# Patient Record
Sex: Female | Born: 1955 | Race: White | Hispanic: No | Marital: Married | Smoking: Never smoker
Health system: Southern US, Community
[De-identification: ages and names within clinical notes are randomized; demographics above are authoritative.]

## PROBLEM LIST (undated history)

## (undated) DIAGNOSIS — Z8719 Personal history of other diseases of the digestive system: Secondary | ICD-10-CM

## (undated) DIAGNOSIS — F419 Anxiety disorder, unspecified: Secondary | ICD-10-CM

## (undated) DIAGNOSIS — Z9889 Other specified postprocedural states: Secondary | ICD-10-CM

## (undated) DIAGNOSIS — R7303 Prediabetes: Secondary | ICD-10-CM

## (undated) DIAGNOSIS — E7800 Pure hypercholesterolemia, unspecified: Secondary | ICD-10-CM

## (undated) DIAGNOSIS — J45909 Unspecified asthma, uncomplicated: Secondary | ICD-10-CM

## (undated) DIAGNOSIS — E781 Pure hyperglyceridemia: Secondary | ICD-10-CM

## (undated) DIAGNOSIS — R112 Nausea with vomiting, unspecified: Secondary | ICD-10-CM

## (undated) DIAGNOSIS — R0789 Other chest pain: Secondary | ICD-10-CM

## (undated) DIAGNOSIS — M1612 Unilateral primary osteoarthritis, left hip: Secondary | ICD-10-CM

## (undated) HISTORY — PX: TONSILLECTOMY: SUR1361

## (undated) HISTORY — PX: WISDOM TOOTH EXTRACTION: SHX21

## (undated) HISTORY — DX: Other chest pain: R07.89

## (undated) HISTORY — DX: Pure hyperglyceridemia: E78.1

## (undated) HISTORY — PX: CARDIAC CATHETERIZATION: SHX172

## (undated) HISTORY — PX: TOOTH EXTRACTION: SUR596

## (undated) HISTORY — PX: OTHER SURGICAL HISTORY: SHX169

## (undated) HISTORY — PX: COLONOSCOPY: SHX174

## (undated) HISTORY — PX: LUMBAR LAMINECTOMY: SHX95

## (undated) HISTORY — DX: Prediabetes: R73.03

## (undated) HISTORY — DX: Pure hypercholesterolemia, unspecified: E78.00

---

## 1988-12-03 HISTORY — PX: OTHER SURGICAL HISTORY: SHX169

## 2001-06-09 ENCOUNTER — Encounter: Payer: Self-pay | Admitting: Family Medicine

## 2001-06-09 ENCOUNTER — Encounter: Admission: RE | Admit: 2001-06-09 | Discharge: 2001-06-09 | Payer: Self-pay | Admitting: Family Medicine

## 2001-06-26 ENCOUNTER — Ambulatory Visit (HOSPITAL_COMMUNITY): Admission: RE | Admit: 2001-06-26 | Discharge: 2001-06-27 | Payer: Self-pay | Admitting: Neurological Surgery

## 2001-06-26 ENCOUNTER — Encounter: Payer: Self-pay | Admitting: Neurological Surgery

## 2001-11-12 ENCOUNTER — Encounter: Payer: Self-pay | Admitting: Neurological Surgery

## 2001-11-12 ENCOUNTER — Encounter: Admission: RE | Admit: 2001-11-12 | Discharge: 2001-11-12 | Payer: Self-pay | Admitting: Neurological Surgery

## 2002-08-11 ENCOUNTER — Encounter: Admission: RE | Admit: 2002-08-11 | Discharge: 2002-08-11 | Payer: Self-pay | Admitting: Neurological Surgery

## 2002-08-11 ENCOUNTER — Encounter: Payer: Self-pay | Admitting: Neurological Surgery

## 2002-12-14 ENCOUNTER — Encounter: Admission: RE | Admit: 2002-12-14 | Discharge: 2002-12-14 | Payer: Self-pay | Admitting: Obstetrics and Gynecology

## 2002-12-14 ENCOUNTER — Encounter: Payer: Self-pay | Admitting: Obstetrics and Gynecology

## 2003-12-28 ENCOUNTER — Inpatient Hospital Stay (HOSPITAL_COMMUNITY): Admission: AD | Admit: 2003-12-28 | Discharge: 2003-12-30 | Payer: Self-pay | Admitting: Neurological Surgery

## 2005-10-22 ENCOUNTER — Other Ambulatory Visit: Admission: RE | Admit: 2005-10-22 | Discharge: 2005-10-22 | Payer: Self-pay | Admitting: Gynecology

## 2009-08-22 ENCOUNTER — Encounter: Admission: RE | Admit: 2009-08-22 | Discharge: 2009-08-22 | Payer: Self-pay | Admitting: Gynecology

## 2009-10-31 ENCOUNTER — Inpatient Hospital Stay (HOSPITAL_BASED_OUTPATIENT_CLINIC_OR_DEPARTMENT_OTHER): Admission: RE | Admit: 2009-10-31 | Discharge: 2009-10-31 | Payer: Self-pay | Admitting: Cardiovascular Disease

## 2010-09-18 ENCOUNTER — Encounter: Admission: RE | Admit: 2010-09-18 | Discharge: 2010-09-18 | Payer: Self-pay | Admitting: Gynecology

## 2010-12-23 ENCOUNTER — Encounter: Payer: Self-pay | Admitting: Obstetrics and Gynecology

## 2010-12-24 ENCOUNTER — Encounter: Payer: Self-pay | Admitting: Gynecology

## 2011-04-20 NOTE — Op Note (Signed)
Ellen Sawyer, Ellen Sawyer                         ACCOUNT NO.:  [id number]   MEDICAL RECORD NO.:  [id number]                   PATIENT TYPE:  OIB   LOCATION:  2899                                 FACILITY:  [facility]   PHYSICIAN:  Stefani Dama, M.D.               DATE OF BIRTH:  May 29, 1956   DATE OF PROCEDURE:  12/28/2003  DATE OF DISCHARGE:                                 OPERATIVE REPORT   PREOPERATIVE DIAGNOSIS:  Herniated nucleus pulposus, L5-S1, left with left  lumbar radiculopathy.   POSTOPERATIVE DIAGNOSIS:  Herniated nucleus pulposus, L5-S1, left with left  lumbar radiculopathy.   OPERATION PERFORMED:  L5-S1 microdiskectomy on the left with operating  microscope, Aesculap retractor.   SURGEON:  Stefani Dama, M.D.   ASSISTANT:  Hilda Lias, M.D.   ANESTHESIA:  General endotracheal.   INDICATIONS FOR PROCEDURE:  The patient is a 55 year old individual who has  had significant back and left lower extremity pain of recent onset.  She has  had a previous disk herniation at L5-S1 eccentric to the left side.  Had  done well for a period of three years.  The pain was identical in nature  to  which she had experienced previously. An MRI demonstrates that she had a  large recurrence of a disk fragment at L5-S1 under the S1 nerve root.  She  has been incapacitated with pain and has been requiring heavy narcotics to  maintain some level of comfort.  Patient was advised regarding surgical  decompression of this process and is now taken to the operating room.   DESCRIPTION OF PROCEDURE:  The patient was brought to the operating room  supine on the stretcher.  After smooth induction of general endotracheal  anesthesia, she was turned prone, the back was shaved, prepped with DuraPrep  and draped in sterile fashion.  Her previous incision was reopened  and the  dissection was carried down through the lumbodorsal fascia to expose the  interlaminar space at L5-S1.  This was positively  localized with a  radiograph.  Caspar lumbar microdiskectomy retractor was then placed in the  wound first in the cephalocaudal direction and then in the medial direction.  These two retractors were used to maintain opening so that the soft tissue  could be cleared over the interlaminar space of L5 out to the mesial wall of  the facet.  High speed air drill  was then used to enlarge the laminotomy  removing the inferior margin of the lamina of L5 out the mesial wall of the  facet.  The common dural tube was identified.  The dissection was carried  out laterally to expose the take off of the S1 nerve root which was noted to  be tented dorsally.  Further dissection laterally identified lateral edge of  the nerve root and then by dissecting under the nerve root inferiorly, a  disk fragment was identified.  This  was removed and immediately allowed for  some decompression on the S1 nerve root.  Further dissection revealed  several other fragments.  Once these were removed, some fibrotic attachments  of the lateral recess of the S1 nerve root could be disarticulated and this  allowed for gentle retraction of the nerve root medially.  No other  fragments of disk were encountered. There was, however, an opening into the  disk space.  This was explored further and opened further with a 15 blade.  Combination of curets and rongeurs was then used to evacuate a significant  quantity of severely degenerated disk material from within the disk space.  The surgery was done with the help of Dr. Jeral Fruit, who provided retraction of  the common dural tube and the S1 nerve root for protection while doing the  lateral portion of the diskectomy under the operating microscope.  Microdissection technique was used during this entire procedure.  In the  end, the common dural tube and the S1 nerve root was well decompressed. Both  free fragments of disk were further encountered and the area was then  checked for  hemostasis.  Microscope was then removed from the field.  The  area was inspected again.  The soft tissues were checked for hemostasis.  The lumbodorsal fascia was closed with #1 Vicryl in interrupted fashion.  2-  0 Vicryl was used in the subcutaneous tissues and 3-0 Vicryl subcuticularly.  Dermabond was placed on the skin.  The patient tolerated the procedure well  and was returned to the recovery room in stable condition.                                               Stefani Dama, M.D.    Ellen Sawyer  D:  12/28/2003  T:  12/28/2003  Job:  [id number]

## 2011-04-20 NOTE — Op Note (Signed)
[place]  Patient:    Ellen Sawyer, Ellen Sawyer                      MRN: [id number] Proc. Date: 06/26/01 Adm. Date:  [id number] Attending:  Jonne Ply                           Operative Report  PREOPERATIVE DIAGNOSIS:  L5-S1 herniated nucleus pulposus on the left, with left lumbar radiculopathy.  POSTOPERATIVE DIAGNOSIS:  L5-S1 herniated nucleus pulposus on the left, with left lumbar radiculopathy.  PROCEDURE:  Lumbar microendoscopic diskectomy with ______  SURGEON:  Stefani Dama, M.D.  ANESTHESIA:  General endotracheal.  INDICATIONS:  The patient is a 55 year old individual who has had significant back and left lower extremity pain.  She has a large herniated nucleus pulposus at L5-S1.  She has been doing poorly with conservative management and because of intractable pain surgery was advised.  DESCRIPTION OF PROCEDURE:  The patient was brought to the operating room supine on a stretcher.  After the smooth induction of general endotracheal anesthesia, she was turned prone.  The back was shaved, prepped with Duraprep and draped in a sterile fashion.  Using fluoroscopy at the left L5-S1 level was localized, and a K-wire was passed to the laminar arch of L5 on the left side.  Then, using a wanding technique, a series of dilators was placed over the K-wire so that ultimately a 6 cm x 18 mm endoscopic tube could be placed over the L5-S1 space on the left.  The tube was attached to the operating room table and framed, and then the microscope was draped and brought into the field.  The soft tissues overlying the laminar arch of L5 were cleared to the medial wall of facet.  Using an Anaspaz drill and a 2.8 mm dissecting tool, laminotomy was created removing any ______ of the lamina of the L5 out to the medial wall of the facet.  The LA ligament was thickened and redundant, and was taken up with the 2 mm and 3 mm Kerrison punch, drill tube was  exposed and then the interval between this and the S1 nerve root was identified.  There was noted to be a significant amount of scar tissue in this area, and some bleeding epidural veins.  These were cauterized, divided and then underlying this was found to be some fragments of disk.  With gentle dissection, then a large free fragment of disk material was removed from this area; this allowed for good decompression of the common dural tube and the S1 nerve root. Further exploration identified two other separate smaller fragments of disk. Once these were removed the area was explored further, and no other disk fragments could be found.  Epidural venous bleeding was controlled with bipolar cautery.  Disk space was noted to have a small rent in the posterior aspect of it, but no free disk material was found in the space.  The space was explored briefly and no loose disk material was found within the disk space itself that could be retrieved with pituitary rongeurs.  At this point, it was felt that the nerve root was decompressed.  The common dural tube was decompressed.  For that reason, no further exploration was performed.  Hemostasis was achieved in the soft tissues.  The endoscopic cannula was removed.  The microscope was removed from the field.  The soft tissues were  closed with 3-0 Vicryl in inverted interrupted fashion up to the subcuticular layer.  Dry sterile dressing was placed on the skin.  The patient tolerated the procedure well and was returned to the recovery room in stable condition. DD:  06/26/01 TD:  06/27/01 Job: [id number]

## 2011-04-20 NOTE — Discharge Summary (Signed)
Ellen Sawyer, Ellen Sawyer                         ACCOUNT NO.:  [id number]   MEDICAL RECORD NO.:  [id number]                   PATIENT TYPE:  OIB   LOCATION:  3008                                 FACILITY:  [facility]   PHYSICIAN:  Stefani Dama, M.D.               DATE OF BIRTH:  18-Mar-1956   DATE OF ADMISSION:  12/28/2003  DATE OF DISCHARGE:  12/30/2003                                 DISCHARGE SUMMARY   ADMISSION DIAGNOSES:  Herniated nucleus pulposus L5-S1 left with left lumbar  radiculopathy.   DISCHARGE/ FINAL DIAGNOSES:  Herniated nucleus pulposus L5-S1 left with left  lumbar radiculopathy.   PROCEDURE:  Lumbar microdiskectomy, L5-S1 left with operating microscope on  December 28, 2003.   CONDITION ON DISCHARGE:  Improving.   HOSPITAL COURSE:  The patient is a 55 year old individual who has had  significant back and left lower extremity pain secondary to recurrence of a  disc herniation at L5-S1 on the left side.  The patient was advised  regarding surgical intervention and was taken to the operating room where  she underwent a surgical decompression at the L5-S1 space.  She tolerated  the procedure well, however, because the patient lives independently and has  teenage kids at the home she was difficult to mobilize at first and required  some additional observation in the hospital until such time when supervision  at home would be available for her.  For that reason she stayed an extra day  in the hospital.  Her condition improved steadily, however, she did complain  of some pain in the left foot which is related to neuropathic irritability.  At this time she is discharged with a prescription for Percocet #60 without  refills, Valium 5 mg, #40 without refills.   FOLLOW UP:  The patient will be seen in the office in two weeks' time for  further follow up.                                                Stefani Dama, M.D.    Ellen Sawyer  D:  12/29/2003  T:  12/30/2003  Job:   [id number]

## 2011-08-09 ENCOUNTER — Emergency Department (HOSPITAL_COMMUNITY): Payer: Commercial Managed Care - PPO

## 2011-08-09 ENCOUNTER — Observation Stay (HOSPITAL_COMMUNITY)
Admission: EM | Admit: 2011-08-09 | Discharge: 2011-08-10 | DRG: 311 | Disposition: A | Discharge status: Home / Self Care | Payer: Commercial Managed Care - PPO | Attending: Internal Medicine | Admitting: Internal Medicine

## 2011-08-09 ENCOUNTER — Encounter: Payer: Self-pay | Admitting: Internal Medicine

## 2011-08-09 DIAGNOSIS — J45909 Unspecified asthma, uncomplicated: Secondary | ICD-10-CM | POA: Insufficient documentation

## 2011-08-09 DIAGNOSIS — E119 Type 2 diabetes mellitus without complications: Secondary | ICD-10-CM

## 2011-08-09 DIAGNOSIS — R079 Chest pain, unspecified: Principal | ICD-10-CM | POA: Insufficient documentation

## 2011-08-09 DIAGNOSIS — E785 Hyperlipidemia, unspecified: Secondary | ICD-10-CM | POA: Insufficient documentation

## 2011-08-09 LAB — BASIC METABOLIC PANEL
CO2: 27 mEq/L (ref 19–32)
Chloride: 103 mEq/L (ref 96–112)
Creatinine, Ser: 0.73 mg/dL (ref 0.50–1.10)
GFR calc Af Amer: >60 mL/min (ref >60)
Sodium: 139 mEq/L (ref 135–145)

## 2011-08-09 LAB — CBC
HCT: 39.5 % (ref 36.0–46.0)
MCHC: 33.7 g/dL (ref 30.0–36.0)
MCV: 79.8 fL (ref 78.0–100.0)
RDW: 15.5 % (ref 11.5–15.5)

## 2011-08-09 LAB — DIFFERENTIAL
Eosinophils Relative: 2 % (ref 0–5)
Lymphocytes Relative: 22 % (ref 12–46)
Lymphs Abs: 1.6 10*3/uL (ref 0.7–4.0)
Monocytes Absolute: 0.3 10*3/uL (ref 0.1–1.0)

## 2011-08-09 LAB — GLUCOSE, CAPILLARY: Glucose-Capillary: 101 mg/dL — ABNORMAL HIGH (ref 70–99)

## 2011-08-09 LAB — POCT I-STAT TROPONIN I: Troponin i, poc: 0 ng/mL (ref 0.00–0.08)

## 2011-08-09 LAB — CARDIAC PANEL(CRET KIN+CKTOT+MB+TROPI)
Relative Index: INVALID (ref 0.0–2.5)
Total CK: 88 U/L (ref 7–177)
Troponin I: <0.3 ng/mL (ref <0.30)

## 2011-08-09 LAB — LIPID PANEL
HDL: 54 mg/dL (ref >39)
LDL Cholesterol: 108 mg/dL — ABNORMAL HIGH (ref 0–99)
Triglycerides: 117 mg/dL (ref <150)
VLDL: 23 mg/dL (ref 0–40)

## 2011-08-09 LAB — CK TOTAL AND CKMB (NOT AT ARMC)
CK, MB: 2.7 ng/mL (ref 0.3–4.0)
Total CK: 71 U/L (ref 7–177)

## 2011-08-09 LAB — TROPONIN I: Troponin I: <0.3 ng/mL (ref <0.30)

## 2011-08-09 NOTE — H&P (Signed)
NISHIKA PARKHURST is an 55 y.o. female.   Chief Complaint: chest pain  HPI:   Ms. [name] is a 55 year old female with a past medical history significant for angina, diabetes mellitus, and dyslipidemia who presents to the ED with chest pain. The chest pain began this morning at 9:00 am when the patient was walking a short distance to her car. The patient drove 30 minutes to work and around 9:30 took 243 mg aspirin (3 baby aspirin). This did not resolve her chest pain despite a history of angina that is relieved by aspirin. 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 The doctor took her blood pressure at that time, and per the patient, it was around 160/130 though she doesn't remember exactly.  The doctor called EMS and transported the patient to [facility].   She describes the pain as sudden onset, sharp pain like a stabbing sensation in the right chest, between the breast and clavicle in the mid-clavicular line. This pain radiated to the back and down the right arm. The patient also complains of a second co-occuring pain that is described as a tightness around the entire chest, like a thick belt is tied around her.  At onset, the pain was a 6-7/10 and totally resolved after morphine administration in the ED. Neither pain is associated with any shortness of breath, nausea, vomiting, or palpitations. The patient did feel sweaty and warm at 9:20 before taking the aspirin but thought this was one of her typical post-menopausal hot-flashes. She reports having one similar episode of chest pain yesterday while at work. Yesterday her pain was left sided and more dull than her pain today. This pain did not radiate and was not associated with shortness of breath, nausea, vomiting, or diaphoresis.  The patient has a history of angina with no known cause. She was worked up by Dr. Charlton Haws of [facility] who performed a cardiac cath and per cardiology note in  E-chart, no abnormality was found. Dr. Eden Emms did start her on Isosorbide 40mg  daily, but the patient has not taken this regularly in about 1 year. The angina usually only comes on while she is mowing her 2 acre yard and goes away with rest and 3 baby aspirin.  She has been in her usual state of health until the last few weeks, when she started to feel like she was dragging. She says she has "lost all get up and go" and feels like she has no stamina.  She also complains of an occasional funny feeling in her chest, like her heart is fluttering or beating heavily. She often takes her pulse during these episodes and thinks she notices her pulse pause or skip a beat. She often feels short of breath for a few seconds after these events. She reports a history of bigeminy but no other arrhythmia. She reports no recent fever, change in mood, depression, or weight loss. She has been under a lot of financial stress lately and thinks this may be involved in her feeling worse.  Ms [name] has a history of GERD that is worse in the evening after spicy or fried foods and is sure the chest pain she experienced today is different. She also has a history of Asthma, her most recent exacerbation was last week after a co-worker sprayed perfume; she is also sure this episode of chest pain was not related to asthma.   Review of  Systems  Constitutional: Positive for malaise/fatigue and diaphoresis. Negative for fever, chills and weight loss.  Respiratory: Positive for wheezing. Negative for cough, hemoptysis, sputum production and shortness of breath.   Cardiovascular: Positive for chest pain, palpitations and leg swelling. Negative for orthopnea, claudication and PND.  Gastrointestinal: Positive for heartburn. Negative for nausea, vomiting, abdominal pain, diarrhea, constipation, blood in stool and melena.  Genitourinary: Positive for urgency and frequency. Negative for dysuria, hematuria and flank pain.  MSK: Positive for recent  fracture of the 5th toe on the left foot Neurological: Negative for dizziness and weakness.  Psychiatric/Behavioral: Negative for depression and substance abuse. Negative for smoking or alcohol use.    Past Medical History -Type II diabetes mellitus -dyslipidemia  -depression -asthma  -Vit D deficiency  -fractured left 5th toe -L5/S1 disk herniation  -Poor medication compliance (patient stopped taking metformin 1000 mg, isosorbide 40 mg, Crestor 10 mg, and Vit D,  one year ago when she got tired of paying for medications.)  Past Surgical History -X2 C-section -24, 26 years ago -X2 lumbar/sacral disk herniation repair X2 plantar fasciitis and bone spur removal - 10 years ago    Family History  -Father alive at 50 with extensive CAD history. First MI in early 26s and subsequent 5 vessel CABG. He has had 4-5 MIs in the past and multiple stents. He was not a smoker, did have a history of CAD, T2DM, HTN, HLD -Mother died at 25 of pancreatic cancer and cirrhosis and had a history of alcohol abuse and smoking.  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 She lives alone in [place], [place]. She has been married twice and is now divorced. She has two kids, a 65 year old son who is in the [place] and a 78 year old daughter who is in the [employer].  She has been under a lot of stress lately as she has had to foreclose on her house and is filing for bankruptcy currently. She thinks this will actually help her stay in her house. She is single now and describes her last marriage as "a nightmare" that ended 10-12 years ago. She does not smoke and does not drink any alcohol. She gets little exercise.    Allergies: Cinoxacin - caused anaphylaxis  Cipro - unsure of reaction  Medications  -60 mg cymbalta PO daily  -Aspirin 81 mg X 3 PRN chest  pain -Symbicort 2 puffs PRN asthma -Flovent 2 puffs PRN asthma   Vitals: BP 154/76, P 72, RR 23, Temp 98.3  Physical Exam General: Vital signs reviewed and noted. Well-developed, obese female, in no acute distress; alert, appropriate and cooperative throughout examination. Head: Normocephalic, atraumatic. Eyes: PERRL, EOMI, No signs of anemia or jaundince. Nose: Mucous membranes moist, not inflammed, nonerythematous. Throat: Oropharynx nonerythematous, no exudate appreciated.  Neck: No deformities, masses, or tenderness noted. Supple, No carotid Bruits, no JVD. Lungs: Normal respiratory effort. Clear to auscultation BL without crackles or wheezes. Heart: RRR. S1 and S2 normal without gallop, murmur, or rubs. Abdomen: BS normoactive. Soft, Nondistended, non-tender. No masses or organomegaly. Exam made difficult due to obesity.  Extremities: No pretibial edema. 2+ pulses radial, posterior tibialis, dorsalis pedis BL Neurologic: nonfocal   Results for orders placed during the hospital encounter of 08/09/11 (from the past 48 hour(s))  DIFFERENTIAL     Status: Normal  Collection Time   08/09/11 12:27 PM      Component Value Range Comment   Neutrophils Relative 71  43 - 77 (%)    Neutro Abs 5.3  1.7 - 7.7 (K/uL)    Lymphocytes Relative 22  12 - 46 (%)    Lymphs Abs 1.6  0.7 - 4.0 (K/uL)    Monocytes Relative 4  3 - 12 (%)    Monocytes Absolute 0.3  0.1 - 1.0 (K/uL)    Eosinophils Relative 2  0 - 5 (%)    Eosinophils Absolute 0.2  0.0 - 0.7 (K/uL)    Basophils Relative 0  0 - 1 (%)    Basophils Absolute 0.0  0.0 - 0.1 (K/uL)   CBC     Status: Normal   Collection Time   08/09/11 12:27 PM      Component Value Range Comment   WBC 7.4  4.0 - 10.5 (K/uL)    RBC 4.95  3.87 - 5.11 (MIL/uL)    Hemoglobin 13.3  12.0 - 15.0 (g/dL)    HCT 16.1  09.6 - 04.5 (%)    MCV 79.8  78.0 - 100.0 (fL)    MCH 26.9  26.0 - 34.0 (pg)    MCHC 33.7  30.0 - 36.0 (g/dL)    RDW 40.9  81.1 - 91.4 (%)     Platelets 238  150 - 400 (K/uL)   BASIC METABOLIC PANEL     Status: Abnormal   Collection Time   08/09/11 12:27 PM      Component Value Range Comment   Sodium 139  135 - 145 (mEq/L)    Potassium 4.4  3.5 - 5.1 (mEq/L)    Chloride 103  96 - 112 (mEq/L)    CO2 27  19 - 32 (mEq/L)    Glucose, Bld 114 (*) 70 - 99 (mg/dL)    BUN 17  6 - 23 (mg/dL)    Creatinine, Ser 7.82  0.50 - 1.10 (mg/dL)    Calcium 9.6  8.4 - 10.5 (mg/dL)    GFR calc non Af Amer >60  >60 (mL/min)    GFR calc Af Amer >60  >60 (mL/min)   POCT I-STAT TROPONIN I     Status: Normal   Collection Time   08/09/11 12:52 PM      Component Value Range Comment   Troponin i, poc 0.00  0.00 - 0.08 (ng/mL)    Comment 3             Dg Chest 2 View  08/09/2011  *RADIOLOGY REPORT*  Clinical Data: Chest pain, diabetes  CHEST - 2 VIEW  Comparison: Chest x-ray of 09/26/2006  Findings: The lungs are clear.  Mediastinal contours appear normal. The heart is within normal limits in size.  No bony abnormality is seen.  IMPRESSION: No active lung disease.  Original Report Authenticated By: Juline Patch, M.D.   EKG 1. 08/09/2011 10:36 am Rate -74 bpm, PR - 120 ms, QT 330 ms, QTc 355 ms Axis P 0 deg, QRS 14 deg, T 38 deg Sinus rhythm Normal -unconfirmed  2. 08/09/2011 10:59 am Rate -88 bpm, PR - 124 ms, QRSD - 78 ms, QT 328 ms, QTc 377 ms Axis P 24 deg, QRS 8 deg, T 18 deg Sinus rhythm Normal -unconfirmed   3. 08/09/2011 11:42 am Rate -68 bpm, PR - 132 ms, QRSD - 78 ms, QT 356 ms, QTc 378 ms Axis P 33 deg, QRS 3 deg, T  41 deg Sinus rhythm Normal -unconfirmed   Cardiac Cath 10/31/2009 Findings  1. Smooth and normal coronary arteries.   2. Normal left ventricular systolic function.  I suspect that her mild       anteroapical defect was due to her breast artifact.  We will       continue with medical therapy.     ROS above Physical Exam above   Assessment/Plan Ms. [name] is a 55 year old female with PMH of angina s/p normal cardiac  catheterization in 2010, T2DM, and dyslipidemia who presents with chest pain.   1. Chest pain Atypical chest pain is common among women and diabetic patients, thus the patient needs to be ruled out for MI or other ACS. The patient has a strong family history of early coronary artery disease and risk factors for CAD including diabetes and dyslipidemia, neither is being managed medically. The patient describes some intermittent palpitations which could put her at risk of forming a clot, further increasing her risk of MI .  Also on the differential diagnosis would be pulmonary embolus, though this is very unlikely given her lack of risk factors, negative history for DVT, and normal O2 sat on room air. Her recent injury to the lower extremity (broken 5th toe on left side) slightly increases her risk of DVT and PE. We will monitor her and reassess for PE with any changes.  Plan:  1. Admit to telemetry monitoring floor 2. serial cardiac enzymes, next draw at 8pm, followed by a third at 2 am.  3. Check fasting lipid panel tomorrow morning to determine CAD risk 4. Get EKG in the morning     2. Type II diabetes Mellitus  Patients random glucose is mildly elevated at 114. She has not been taking the prescribed metformin 1000 mg PO daily for approximately one year. She gets little exercise and has gained 50 lbs in the last few years. All of these are risk factors for progression of her diabetes disease.  Plan:  1. Get HbA1C level 2. Write for sliding scale insulin to be given per protocol 3. Discuss with the patient the importance of lifestyle management and medication compliance with diabetes especially in the setting of chest pain, as diabetes is a risk factor for heart disease.   3. Disposition Patient is being worked up for chest pain and ruled out for MI. Plan: If CE and EKG remain normal, patient can leave tomorrow and follow up with [facility].   Maylon Sailors 08/09/2011, 4:37 PM

## 2011-08-10 ENCOUNTER — Inpatient Hospital Stay (HOSPITAL_COMMUNITY): Payer: Commercial Managed Care - PPO

## 2011-08-10 LAB — DRUGS OF ABUSE SCREEN W/O ALC, ROUTINE URINE
Barbiturate Quant, Ur: NEGATIVE
Cocaine Metabolites: NEGATIVE
Creatinine,U: 132.5 mg/dL
Opiate Screen, Urine: POSITIVE — AB
Phencyclidine (PCP): NEGATIVE
Propoxyphene: NEGATIVE

## 2011-08-10 LAB — BASIC METABOLIC PANEL
BUN: 18 mg/dL (ref 6–23)
Calcium: 9 mg/dL (ref 8.4–10.5)
GFR calc Af Amer: >60 mL/min (ref >60)
GFR calc non Af Amer: >60 mL/min (ref >60)
Glucose, Bld: 103 mg/dL — ABNORMAL HIGH (ref 70–99)

## 2011-08-10 LAB — HEMOGLOBIN A1C: Mean Plasma Glucose: 123 mg/dL — ABNORMAL HIGH (ref <117)

## 2011-08-10 LAB — GLUCOSE, CAPILLARY
Glucose-Capillary: 105 mg/dL — ABNORMAL HIGH (ref 70–99)
Glucose-Capillary: 110 mg/dL — ABNORMAL HIGH (ref 70–99)

## 2011-08-10 LAB — CBC
HCT: 37 % (ref 36.0–46.0)
Hemoglobin: 12.1 g/dL (ref 12.0–15.0)
MCH: 26.2 pg (ref 26.0–34.0)
MCHC: 32.7 g/dL (ref 30.0–36.0)
RDW: 15.5 % (ref 11.5–15.5)

## 2011-08-15 LAB — OPIATE, QUANTITATIVE, URINE
Hydrocodone: NEGATIVE NG/ML
Morphine, Confirm: 411 NG/ML — ABNORMAL HIGH

## 2011-08-25 NOTE — Discharge Summary (Signed)
Ellen Sawyer, Ellen Sawyer               ACCOUNT NO.:  [id number]  MEDICAL RECORD NO.:  [id number]  LOCATION:  4737                         FACILITY:  [facility]  PHYSICIAN:  Doneen Poisson, MD     DATE OF BIRTH:  [birth year]  DATE OF ADMISSION:  08/09/2011 DATE OF DISCHARGE:  08/10/2011                              DISCHARGE SUMMARY   DISCHARGE DIAGNOSES: 1. Chest pain, unknown cause, likely due to cardiac vasospasm, also in the     differential esophageal spasm, biliary colic, studies pending to     further investigate these processes. 2. Asthma, no changes from the patient' baseline.  DISCHARGE MEDICATIONS: 1. Imdur 30 mg p.o. daily. 2. Aspirin 81 mg 3 pills p.o. p.r.n. chest pain. 3. Symbicort 2 puffs p.r.n. asthma.  DISPOSITION AND FOLLOWUP:  Follow up with your primary care doctor, Dr. Ludwig Clarks.  Appointment is Thursday, August 16, 2011, at 3:30 p.m. Please discuss the results of the barium swallow study to assess for esophageal spasm, discuss results of right upper quadrant ultrasound  to assess biliary colic as cause of chest pain.  PROCEDURES: 1. EKG, multiple EKGs sinus rhythm, normal EKG. 2. Barium swallow and results pending. 3. Right upper quadrant abdominal ultrasound, results pending.  CONSULTATIONS:  None.  HISTORY, PHYSICIAL AND LABORATORY DATA: [name] is a 55 year old woman with a past medical history  significant for chest pain, diabetes mellitus, and dyslipidemia who  presents to the ED with chest pain.  The chest pain began this morning  at 9 a.m. when she was walking a short distance to her car.  She drove  30 minutes to work and around 9:30 took 243 mg of aspirin (3 baby  aspirin).  This did not resolve her chest pain.  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   The doctor took her blood pressure at that time and, per [name], the  blood pressure was high at 145/100.  She does not remember the numbers    exactly.  The doctor called EMS and transported her to [facility].  She described the pain as sudden onset, sharp pain, like a stabbing sensation in the right chest, between the breast and clavicle in the midclavicular line.  The pain radiated into the back and down the right arm.  She also complained of a second co-occurring pain that is described as a tightness around the entire chest, like a thick belt that is tied around her.  At onset, her pain was a 6-7/10 and was totally resolved after morphine administration in the ED.  Neither pain is associated with any shortness of breath, nausea, vomiting, or palpitations.  She did feel sweaty and warm at 9:20 a.m. before taking the aspirin, but felt this was one of her typical postmenopausal hot flashes.  She reports one similar episode of chest pain yesterday while at work.  Yesterday, her pain was left sided and more dull than her pain upon admission.  The pain did not radiate and is not associated with any shortness of breath, nausea, vomiting, or diaphoresis.  She has a history of chest pain with  no known cause. She was worked up by Dr. Charlton Haws of [facility] who performed a cardiac catheterization and per Cardiology note in E-chart no abnormality was found.  This was in November 2010.  Dr. Eden Emms started her on isosorbide 40 mg daily, but she has not taken this regularly in approximately 1 year.  The chest pain usually only comes while she is active or stressed, for instance while mowing the 2- acre yard or experiencing stress at work.  The pain usually is resolved by 3 baby aspirins.  She has been in her usual state of health until the last few weeks, she started to feel like she was dragging.  She also complained of an occasional funny feeling in her chest, like her heart is fluttering or beating heavily.  She often takes her pulse during these episodes and thinks she notices her pulses pause or skip a beat. She  often feels short of breath for a few seconds after these fluttering events.  She reports a history of bigeminy, but no other arrhythmia. She reports no recent, fever, change in mood, depression, or weight loss.  She has been under a lot financial stress lately and thinks this may be involved her feeling worse.  [name] has a history of esophageal reflux that is worse in the evening and after eating spicy foods.  She is confident that the chest pain she has experienced today and yesterday are unrelated to a GERD process.  She also has a history of asthma, her most recent exacerbation was last week, she is also sure that this episode of chest pain is not related to asthma.  White blood cell count 7.4, hemoglobin 13.3, hematocrit 39.5, platelets  239.  Sodium 139, potassium 4.4, chloride 103, bicarb 127, BUN 17,  creatinine 0.73, glucose 114.  HOSPITAL COURSE: 1. Chest pain:  [name]'s chest pain is unlikely due to coronary     artery disease despite her family history and modest risk factors     including a history of type 2 diabetes mellitus and dyslipidemia,     her cardiac catheterization in 2010 shows no signs of coronary     artery disease and serial cardiac enzymes were negative x3.  Her      risk factors for coronary artery disease were assessed.  Her      hemoglobin A1c was 5.9, thus she no longer meets criteria for      diabetes mellitus.  Her cholesterol LDL was 108 and her HDL was      54, thus statin therapy is not indicated at this time.  The most      likely, diagnosis of the chest pain is vasospasm of the coronary      arteries and she was instructed to start Imdur 30 mg daily to help      with this.  Other possibilities include esophageal spasm, which      should be more clear after the barium swallow study results are in.       Imdur will also help if esophageal spasm is the etiology of her      chest pain.  Results from the right upper quadrant ultrasound and       barium swallow study are pending.  DISCHARGE VITAL SIGNS AND LABORATORY DATA:  Temperature 97.7, pulse 77, respiratory rate 20, blood pressure 109/74, O2 saturation 96% on room air.  Troponin I < 0.3.  CK and CK-MB normal.  Hemoglobin A1c 5.9.  Total  cholesterol 185, triglycerides 117, HDL 54, LDL 108, VLDL 23.   ______________________________ Kristie Cowman, MD   ______________________________ Doneen Poisson, MD   KS/MEDQ  D:  08/10/2011  T:  08/10/2011  Job:  [id number]  cc:   Henri Medal, MD  Electronically Signed by Kristie Cowman MD on 08/20/2011 01:07:00 PM Electronically Signed by Doneen Poisson  on 08/25/2011 12:08:46 PM

## 2012-08-19 ENCOUNTER — Encounter: Payer: Self-pay | Admitting: Cardiovascular Disease

## 2017-05-31 ENCOUNTER — Ambulatory Visit
Admission: RE | Admit: 2017-05-31 | Discharge: 2017-05-31 | Disposition: A | Discharge status: Home / Self Care | Payer: PRIVATE HEALTH INSURANCE | Source: Ambulatory Visit | Attending: Physician Assistant | Admitting: Physician Assistant

## 2017-05-31 ENCOUNTER — Other Ambulatory Visit: Payer: Self-pay | Admitting: Physician Assistant

## 2017-05-31 DIAGNOSIS — M79605 Pain in left leg: Secondary | ICD-10-CM

## 2017-05-31 DIAGNOSIS — M7989 Other specified soft tissue disorders: Secondary | ICD-10-CM

## 2017-07-26 ENCOUNTER — Other Ambulatory Visit (HOSPITAL_COMMUNITY): Payer: Self-pay | Admitting: Neurological Surgery

## 2017-07-26 ENCOUNTER — Other Ambulatory Visit: Payer: Self-pay | Admitting: Neurological Surgery

## 2017-07-26 DIAGNOSIS — M4316 Spondylolisthesis, lumbar region: Secondary | ICD-10-CM

## 2017-08-15 ENCOUNTER — Ambulatory Visit (HOSPITAL_COMMUNITY)
Admission: RE | Admit: 2017-08-15 | Discharge: 2017-08-15 | Disposition: A | Discharge status: Home / Self Care | Payer: PRIVATE HEALTH INSURANCE | Source: Ambulatory Visit | Attending: Neurological Surgery | Admitting: Neurological Surgery

## 2017-08-15 DIAGNOSIS — M4327 Fusion of spine, lumbosacral region: Secondary | ICD-10-CM | POA: Insufficient documentation

## 2017-08-15 DIAGNOSIS — M48061 Spinal stenosis, lumbar region without neurogenic claudication: Secondary | ICD-10-CM | POA: Diagnosis not present

## 2017-08-15 DIAGNOSIS — M4316 Spondylolisthesis, lumbar region: Secondary | ICD-10-CM | POA: Insufficient documentation

## 2017-08-15 DIAGNOSIS — M5136 Other intervertebral disc degeneration, lumbar region: Secondary | ICD-10-CM | POA: Diagnosis not present

## 2017-08-15 MED ORDER — DIAZEPAM 5 MG PO TABS
10.0000 mg | ORAL_TABLET | Freq: Once | ORAL | Status: AC
  Administered 2017-08-15: 10 mg via ORAL
  Filled 2017-08-15: qty 2

## 2017-08-15 MED ORDER — HYDROCODONE-ACETAMINOPHEN 5-325 MG PO TABS
ORAL_TABLET | ORAL | Status: AC
  Filled 2017-08-15: qty 1

## 2017-08-15 MED ORDER — DIAZEPAM 5 MG PO TABS
ORAL_TABLET | ORAL | Status: AC
  Administered 2017-08-15: 10 mg via ORAL
  Filled 2017-08-15: qty 2

## 2017-08-15 MED ORDER — LIDOCAINE HCL (PF) 1 % IJ SOLN
5.0000 mL | Freq: Once | INTRAMUSCULAR | Status: AC
  Administered 2017-08-15: 5 mL via INTRADERMAL

## 2017-08-15 MED ORDER — HYDROCODONE-ACETAMINOPHEN 5-325 MG PO TABS
1.0000 | ORAL_TABLET | ORAL | Status: DC | PRN
  Administered 2017-08-15: 1 via ORAL

## 2017-08-15 MED ORDER — ONDANSETRON HCL 4 MG/2ML IJ SOLN: 4.0000 mg | Freq: Four times a day (QID) | INTRAMUSCULAR | Status: DC | PRN

## 2017-08-15 MED ORDER — IOPAMIDOL (ISOVUE-M 200) INJECTION 41%
20.0000 mL | Freq: Once | INTRAMUSCULAR | Status: AC
  Administered 2017-08-15: 12 mL via INTRATHECAL

## 2017-08-15 NOTE — Procedures (Signed)
[name] is a 61 year old individual is had a previous microdiscectomy. 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  Pre op Dx: Spondylolisthesis L3-4 and L4-5 with neurogenic claudication Post op Dx: Same Procedure: Lumbar myelogram Surgeon: Janziel Hockett Puncture level: L2-3  Fluid color: Clear colorless Injection: Isovue-200 12 mL Findings: Severe stenosis worse with extension moderate stenosis L3-4 further evaluation with CT scan

## 2017-08-15 NOTE — Discharge Instructions (Signed)
Myelogram, Care After °These instructions give you information about caring for yourself after your procedure. Your doctor may also give you more specific instructions. Call your doctor if you have any problems or questions after your procedure. °Follow these instructions at home: °· Drink enough fluid to keep your pee (urine) clear or pale yellow. °· Rest as told by your doctor. °· Lie flat with your head slightly raised (elevated). °· Do not bend, lift, or do any hard activities for 24-48 hours or as told by your doctor. °· Take over-the-counter and prescription medicines only as told by your doctor. °· Take care of and remove your bandage (dressing) as told by your doctor. °· Bathe or shower as told by your doctor. °Contact a health care provider if: °· You have a fever. °· You have a headache that lasts longer than 24 hours. °· You feel sick to your stomach (nauseous). °· You throw up (vomit). °· Your neck is stiff. °· Your legs feel numb. °· You cannot pee. °· You cannot poop (have a bowel movement). °· You have a rash. °· You are itchy or sneezing. °Get help right away if: °· You have new symptoms or your symptoms get worse. °· You have a seizure. °· You have trouble breathing. °This information is not intended to replace advice given to you by your health care provider. Make sure you discuss any questions you have with your health care provider. °Document Released: 08/28/2008 Document Revised: 07/19/2016 Document Reviewed: 09/01/2015 °Elsevier Interactive Patient Education © 2018 [employer] ° °

## 2017-08-26 ENCOUNTER — Other Ambulatory Visit: Payer: Self-pay | Admitting: Neurological Surgery

## 2017-09-03 NOTE — Pre-Procedure Instructions (Signed)
Ellen Sawyer  09/03/2017      [id number] - Ellen Sawyer, [place] - [address] [name] [address] Phone: [phone] Fax: [phone]    Your procedure is scheduled on October 8  Report to Ellen [facility]yer Admitting at 0530 A.M.  Call this number if you have problems the morning of surgery:  [phone]   Remember:  Do not eat food or drink liquids after midnight.  Continue all other medications as directed by your physician except follow these medication instructions before surgery   Take these medicines the morning of surgery with A SIP OF WATER budesonide-formoterol (SYMBICORT) DULoxetine (CYMBALTA)  levothyroxine (SYNTHROID, LEVOTHROID)  7 days prior to surgery STOP taking any Aspirin (unless otherwise instructed by your surgeon), Aleve, Naproxen, Ibuprofen, Motrin, Advil, Goody's, BC's, all herbal medications, fish oil, and all vitamins    Do not wear jewelry, make-up or nail polish.  Do not wear lotions, powders, or perfumes, or deoderant.  Do not shave 48 hours prior to surgery.  Men may shave face and neck.  Do not bring valuables to the [facility].  [facility] is not responsible for any belongings or valuables.  Contacts, dentures or bridgework may not be worn into surgery.  Leave your suitcase in the car.  After surgery it may be brought to your room.  For patients admitted to the [facility], discharge time will be determined by your treatment team.  Patients discharged the day of surgery will not be allowed to drive home.     Special instructions:   [name]- Preparing For Surgery  Before surgery, you can play an important role. Because skin is not sterile, your skin needs to be as free of germs as possible. You can reduce the number of germs on your skin by washing with CHG (chlorahexidine gluconate) Soap before surgery.  CHG is an antiseptic cleaner which kills germs and bonds with the skin to continue killing  germs even after washing.  Please do not use if you have an allergy to CHG or antibacterial soaps. If your skin becomes reddened/irritated stop using the CHG.  Do not shave (including legs and underarms) for at least 48 hours prior to first CHG shower. It is OK to shave your face.  Please follow these instructions carefully.   1. Shower the NIGHT BEFORE SURGERY and the MORNING OF SURGERY with CHG.   2. If you chose to wash your hair, wash your hair first as usual with your normal shampoo.  3. After you shampoo, rinse your hair and body thoroughly to remove the shampoo.  4. Use CHG as you would any other liquid soap. You can apply CHG directly to the skin and wash gently with a scrungie or a clean washcloth.   5. Apply the CHG Soap to your body ONLY FROM THE NECK DOWN.  Do not use on open wounds or open sores. Avoid contact with your eyes, ears, mouth and genitals (private parts). Wash genitals (private parts) with your normal soap.  USE REGULAR SHAMPOO AND CONDITIONER FOR HAIR USE REGULAR SOAP FOR FACE AND PRIVATE AREA  6. Wash thoroughly, paying special attention to the area where your surgery will be performed.  7. Thoroughly rinse your body with warm water from the neck down.  8. DO NOT shower/wash with your normal soap after using and rinsing off the CHG Soap.  9. Pat yourself dry with a CLEAN TOWEL and WASH CLOTH  10. Wear CLEAN PAJAMAS to  bed the night before surgery, wear comfortable clothes the morning of surgery  11. Place CLEAN SHEETS on your bed the night of your first shower and DO NOT SLEEP WITH PETS.    Day of Surgery: Do not apply any deodorants/lotions. Please wear clean clothes to the [facility]/surgery center.      Please read over the following fact sheets that you were given.

## 2017-09-04 ENCOUNTER — Encounter (HOSPITAL_COMMUNITY): Payer: Self-pay

## 2017-09-04 ENCOUNTER — Encounter (HOSPITAL_COMMUNITY)
Admission: RE | Admit: 2017-09-04 | Discharge: 2017-09-04 | Disposition: A | Discharge status: Home / Self Care | Payer: PRIVATE HEALTH INSURANCE | Source: Ambulatory Visit | Attending: Neurological Surgery | Admitting: Neurological Surgery

## 2017-09-04 DIAGNOSIS — Z01818 Encounter for other preprocedural examination: Secondary | ICD-10-CM | POA: Insufficient documentation

## 2017-09-04 DIAGNOSIS — M48062 Spinal stenosis, lumbar region with neurogenic claudication: Secondary | ICD-10-CM | POA: Diagnosis not present

## 2017-09-04 DIAGNOSIS — I10 Essential (primary) hypertension: Secondary | ICD-10-CM | POA: Diagnosis not present

## 2017-09-04 HISTORY — DX: Other specified postprocedural states: R11.2

## 2017-09-04 HISTORY — DX: Anxiety disorder, unspecified: F41.9

## 2017-09-04 HISTORY — DX: Unspecified asthma, uncomplicated: J45.909

## 2017-09-04 HISTORY — DX: Other specified postprocedural states: Z98.890

## 2017-09-04 HISTORY — DX: Personal history of other diseases of the digestive system: Z87.19

## 2017-09-04 LAB — BASIC METABOLIC PANEL
ANION GAP: 5 (ref 5–15)
BUN: 15 mg/dL (ref 6–20)
CHLORIDE: 104 mmol/L (ref 101–111)
CO2: 29 mmol/L (ref 22–32)
Calcium: 9 mg/dL (ref 8.9–10.3)
Creatinine, Ser: 0.9 mg/dL (ref 0.44–1.00)
GFR calc Af Amer: >60 mL/min (ref >60)
GLUCOSE: 136 mg/dL — AB (ref 65–99)
POTASSIUM: 3.6 mmol/L (ref 3.5–5.1)
Sodium: 138 mmol/L (ref 135–145)

## 2017-09-04 LAB — CBC
HEMATOCRIT: 39 % (ref 36.0–46.0)
HEMOGLOBIN: 12.5 g/dL (ref 12.0–15.0)
MCH: 27.3 pg (ref 26.0–34.0)
MCHC: 32.1 g/dL (ref 30.0–36.0)
MCV: 85.2 fL (ref 78.0–100.0)
Platelets: 250 10*3/uL (ref 150–400)
RBC: 4.58 MIL/uL (ref 3.87–5.11)
RDW: 15.1 % (ref 11.5–15.5)
WBC: 7.8 10*3/uL (ref 4.0–10.5)

## 2017-09-04 LAB — SURGICAL PCR SCREEN
MRSA, PCR: NEGATIVE
STAPHYLOCOCCUS AUREUS: NEGATIVE

## 2017-09-04 LAB — TYPE AND SCREEN
ABO/RH(D): B POS
Antibody Screen: NEGATIVE

## 2017-09-04 LAB — ABO/RH: ABO/RH(D): B POS

## 2017-09-04 NOTE — Progress Notes (Signed)
PCP - Tish Men Cardiologist - denies but has seen a cardiologist in the past   Chest x-ray - not needed  EKG - 09/04/17 Stress Test - 2010 ECHO - denies Cardiac Cath - around 8 to 10 years ago     Patient denies shortness of breath, fever, cough and chest pain at PAT appointment   Patient verbalized understanding of instructions that were given to them at the PAT appointment. Patient was also instructed that they will need to review over the PAT instructions again at home before surgery.

## 2017-09-06 MED ORDER — VANCOMYCIN HCL IN DEXTROSE 1-5 GM/200ML-% IV SOLN
1000.0000 mg | INTRAVENOUS | Status: AC
  Administered 2017-09-09: 1000 mg via INTRAVENOUS
  Filled 2017-09-06: qty 200

## 2017-09-08 NOTE — Anesthesia Preprocedure Evaluation (Addendum)
Anesthesia Evaluation  Patient identified by MRN, date of birth, ID band Patient awake    Reviewed: Allergy & Precautions, NPO status , Patient's Chart, lab work & pertinent test results  History of Anesthesia Complications (+) PONV  Airway Mallampati: I  TM Distance: >3 FB     Dental  (+) Teeth Intact, Dental Advisory Given   Pulmonary asthma ,    Pulmonary exam normal breath sounds clear to auscultation       Cardiovascular Normal cardiovascular exam Rhythm:Regular Rate:Normal     Neuro/Psych negative neurological ROS     GI/Hepatic hiatal hernia, GERD  Controlled,  Endo/Other  Hypothyroidism   Renal/GU      Musculoskeletal  (+) Arthritis , Osteoarthritis,    Abdominal (+) + obese,   Peds  Hematology   Anesthesia Other Findings   Reproductive/Obstetrics                           Anesthesia Physical Anesthesia Plan  ASA: II  Anesthesia Plan: General   Post-op Pain Management:    Induction: Intravenous  PONV Risk Score and Plan: 4 or greater and Ondansetron, Dexamethasone, Midazolam, Scopolamine patch - Pre-op and Treatment may vary due to age or medical condition  Airway Management Planned: Oral ETT  Additional Equipment:   Intra-op Plan:   Post-operative Plan: Extubation in OR  Informed Consent: I have reviewed the patients History and Physical, chart, labs and discussed the procedure including the risks, benefits and alternatives for the proposed anesthesia with the patient or authorized representative who has indicated his/her understanding and acceptance.   Dental advisory given  Plan Discussed with: CRNA, Surgeon and Anesthesiologist  Anesthesia Plan Comments:       Anesthesia Quick Evaluation

## 2017-09-09 ENCOUNTER — Encounter (HOSPITAL_COMMUNITY): Payer: Self-pay | Admitting: *Deleted

## 2017-09-09 ENCOUNTER — Inpatient Hospital Stay (HOSPITAL_COMMUNITY)
Admission: RE | Admit: 2017-09-09 | Discharge: 2017-09-11 | DRG: 455 | Disposition: A | Discharge status: Home / Self Care | Payer: PRIVATE HEALTH INSURANCE | Source: Ambulatory Visit | Attending: Neurological Surgery | Admitting: Neurological Surgery

## 2017-09-09 ENCOUNTER — Inpatient Hospital Stay (HOSPITAL_COMMUNITY): Payer: PRIVATE HEALTH INSURANCE | Admitting: Anesthesiology

## 2017-09-09 ENCOUNTER — Inpatient Hospital Stay (HOSPITAL_COMMUNITY): Payer: PRIVATE HEALTH INSURANCE

## 2017-09-09 ENCOUNTER — Encounter (HOSPITAL_COMMUNITY)
Admission: RE | Disposition: A | Discharge status: Home / Self Care | Payer: Self-pay | Source: Ambulatory Visit | Attending: Neurological Surgery

## 2017-09-09 DIAGNOSIS — Z419 Encounter for procedure for purposes other than remedying health state, unspecified: Secondary | ICD-10-CM

## 2017-09-09 DIAGNOSIS — Z981 Arthrodesis status: Secondary | ICD-10-CM | POA: Diagnosis not present

## 2017-09-09 DIAGNOSIS — M5416 Radiculopathy, lumbar region: Secondary | ICD-10-CM | POA: Diagnosis present

## 2017-09-09 DIAGNOSIS — M4316 Spondylolisthesis, lumbar region: Secondary | ICD-10-CM | POA: Diagnosis present

## 2017-09-09 DIAGNOSIS — Z79899 Other long term (current) drug therapy: Secondary | ICD-10-CM | POA: Diagnosis not present

## 2017-09-09 DIAGNOSIS — M1990 Unspecified osteoarthritis, unspecified site: Secondary | ICD-10-CM | POA: Diagnosis present

## 2017-09-09 DIAGNOSIS — Z7951 Long term (current) use of inhaled steroids: Secondary | ICD-10-CM | POA: Diagnosis not present

## 2017-09-09 DIAGNOSIS — J45909 Unspecified asthma, uncomplicated: Secondary | ICD-10-CM | POA: Diagnosis present

## 2017-09-09 DIAGNOSIS — M48062 Spinal stenosis, lumbar region with neurogenic claudication: Principal | ICD-10-CM | POA: Diagnosis present

## 2017-09-09 DIAGNOSIS — E039 Hypothyroidism, unspecified: Secondary | ICD-10-CM | POA: Diagnosis present

## 2017-09-09 DIAGNOSIS — Z7982 Long term (current) use of aspirin: Secondary | ICD-10-CM | POA: Diagnosis not present

## 2017-09-09 LAB — GLUCOSE, CAPILLARY: GLUCOSE-CAPILLARY: 169 mg/dL — AB (ref 65–99)

## 2017-09-09 SURGERY — POSTERIOR LUMBAR FUSION 2 LEVEL
Anesthesia: General | Site: Spine Lumbar

## 2017-09-09 MED ORDER — METHOCARBAMOL 500 MG PO TABS
500.0000 mg | ORAL_TABLET | Freq: Four times a day (QID) | ORAL | Status: DC | PRN
  Administered 2017-09-09 – 2017-09-11 (×7): 500 mg via ORAL
  Filled 2017-09-09 (×6): qty 1

## 2017-09-09 MED ORDER — ONDANSETRON HCL 4 MG PO TABS: 4.0000 mg | ORAL_TABLET | Freq: Four times a day (QID) | ORAL | Status: DC | PRN

## 2017-09-09 MED ORDER — HYDROCODONE-ACETAMINOPHEN 5-325 MG PO TABS: 1.0000 | ORAL_TABLET | ORAL | Status: DC | PRN

## 2017-09-09 MED ORDER — SUGAMMADEX SODIUM 200 MG/2ML IV SOLN
INTRAVENOUS | Status: DC | PRN
  Administered 2017-09-09: 200 mg via INTRAVENOUS

## 2017-09-09 MED ORDER — LACTATED RINGERS IV SOLN
INTRAVENOUS | Status: DC | PRN
  Administered 2017-09-09 (×3): via INTRAVENOUS

## 2017-09-09 MED ORDER — 0.9 % SODIUM CHLORIDE (POUR BTL) OPTIME
TOPICAL | Status: DC | PRN
  Administered 2017-09-09: 1000 mL

## 2017-09-09 MED ORDER — SODIUM CHLORIDE 0.9 % IR SOLN
Status: DC | PRN
  Administered 2017-09-09: 500 mL

## 2017-09-09 MED ORDER — VANCOMYCIN HCL IN DEXTROSE 1-5 GM/200ML-% IV SOLN
1000.0000 mg | Freq: Once | INTRAVENOUS | Status: AC
  Administered 2017-09-09: 1000 mg via INTRAVENOUS
  Filled 2017-09-09: qty 200

## 2017-09-09 MED ORDER — THROMBIN 5000 UNITS EX SOLR
OROMUCOSAL | Status: DC | PRN
  Administered 2017-09-09 (×2): 5 mL via TOPICAL

## 2017-09-09 MED ORDER — DEXTROSE 5 % IV SOLN
500.0000 mg | Freq: Four times a day (QID) | INTRAVENOUS | Status: DC | PRN
  Filled 2017-09-09: qty 5

## 2017-09-09 MED ORDER — PHENYLEPHRINE HCL 10 MG/ML IJ SOLN
INTRAMUSCULAR | Status: DC | PRN
  Administered 2017-09-09: 20 ug/min via INTRAVENOUS

## 2017-09-09 MED ORDER — ONDANSETRON HCL 4 MG/2ML IJ SOLN: 4.0000 mg | Freq: Four times a day (QID) | INTRAMUSCULAR | Status: DC | PRN

## 2017-09-09 MED ORDER — ONDANSETRON HCL 4 MG/2ML IJ SOLN
INTRAMUSCULAR | Status: AC
  Filled 2017-09-09: qty 2

## 2017-09-09 MED ORDER — FENTANYL CITRATE (PF) 250 MCG/5ML IJ SOLN
INTRAMUSCULAR | Status: AC
  Filled 2017-09-09: qty 5

## 2017-09-09 MED ORDER — LIDOCAINE HCL (CARDIAC) 20 MG/ML IV SOLN
INTRAVENOUS | Status: DC | PRN
  Administered 2017-09-09: 100 mg via INTRAVENOUS

## 2017-09-09 MED ORDER — KETOROLAC TROMETHAMINE 30 MG/ML IJ SOLN
INTRAMUSCULAR | Status: AC
  Filled 2017-09-09: qty 1

## 2017-09-09 MED ORDER — BUPIVACAINE HCL (PF) 0.5 % IJ SOLN
INTRAMUSCULAR | Status: AC
  Filled 2017-09-09: qty 30

## 2017-09-09 MED ORDER — ALUM & MAG HYDROXIDE-SIMETH 200-200-20 MG/5ML PO SUSP: 30.0000 mL | Freq: Four times a day (QID) | ORAL | Status: DC | PRN

## 2017-09-09 MED ORDER — PROMETHAZINE HCL 25 MG/ML IJ SOLN: 6.2500 mg | INTRAMUSCULAR | Status: DC | PRN

## 2017-09-09 MED ORDER — THROMBIN 20000 UNITS EX SOLR
CUTANEOUS | Status: DC | PRN
  Administered 2017-09-09: 20 mL via TOPICAL

## 2017-09-09 MED ORDER — OXYCODONE-ACETAMINOPHEN 5-325 MG PO TABS
1.0000 | ORAL_TABLET | ORAL | Status: DC | PRN
  Administered 2017-09-09 – 2017-09-11 (×10): 2 via ORAL
  Filled 2017-09-09 (×10): qty 2

## 2017-09-09 MED ORDER — EPHEDRINE 5 MG/ML INJ
INTRAVENOUS | Status: AC
  Filled 2017-09-09: qty 10

## 2017-09-09 MED ORDER — MIDAZOLAM HCL 2 MG/2ML IJ SOLN
INTRAMUSCULAR | Status: AC
  Filled 2017-09-09: qty 2

## 2017-09-09 MED ORDER — THROMBIN 5000 UNITS EX SOLR
CUTANEOUS | Status: AC
  Filled 2017-09-09: qty 5000

## 2017-09-09 MED ORDER — LEVOTHYROXINE SODIUM 100 MCG PO TABS
50.0000 ug | ORAL_TABLET | Freq: Every day | ORAL | Status: DC
  Administered 2017-09-10 – 2017-09-11 (×2): 50 ug via ORAL
  Filled 2017-09-09 (×2): qty 1

## 2017-09-09 MED ORDER — POLYETHYLENE GLYCOL 3350 17 G PO PACK
17.0000 g | PACK | Freq: Every day | ORAL | Status: DC | PRN
  Administered 2017-09-10: 17 g via ORAL
  Filled 2017-09-09: qty 1

## 2017-09-09 MED ORDER — SUCCINYLCHOLINE CHLORIDE 200 MG/10ML IV SOSY
PREFILLED_SYRINGE | INTRAVENOUS | Status: AC
  Filled 2017-09-09: qty 10

## 2017-09-09 MED ORDER — CHLORHEXIDINE GLUCONATE CLOTH 2 % EX PADS: 6.0000 | MEDICATED_PAD | Freq: Once | CUTANEOUS | Status: DC

## 2017-09-09 MED ORDER — ROCURONIUM BROMIDE 100 MG/10ML IV SOLN
INTRAVENOUS | Status: DC | PRN
Start: 2017-09-09 — End: 2017-09-09
  Administered 2017-09-09: 50 mg via INTRAVENOUS
  Administered 2017-09-09 (×2): 20 mg via INTRAVENOUS
  Administered 2017-09-09: 10 mg via INTRAVENOUS

## 2017-09-09 MED ORDER — SODIUM CHLORIDE 0.9 % IV SOLN: 250.0000 mL | INTRAVENOUS | Status: DC

## 2017-09-09 MED ORDER — MENTHOL 3 MG MT LOZG: 1.0000 | LOZENGE | OROMUCOSAL | Status: DC | PRN

## 2017-09-09 MED ORDER — MOMETASONE FURO-FORMOTEROL FUM 200-5 MCG/ACT IN AERO: 2.0000 | INHALATION_SPRAY | Freq: Two times a day (BID) | RESPIRATORY_TRACT | Status: DC

## 2017-09-09 MED ORDER — LIDOCAINE-EPINEPHRINE 1 %-1:100000 IJ SOLN
INTRAMUSCULAR | Status: DC | PRN
  Administered 2017-09-09: 5 mL

## 2017-09-09 MED ORDER — ACETAMINOPHEN 650 MG RE SUPP: 650.0000 mg | RECTAL | Status: DC | PRN

## 2017-09-09 MED ORDER — SENNA 8.6 MG PO TABS
1.0000 | ORAL_TABLET | Freq: Two times a day (BID) | ORAL | Status: DC
  Administered 2017-09-09 – 2017-09-11 (×5): 8.6 mg via ORAL
  Filled 2017-09-09 (×5): qty 1

## 2017-09-09 MED ORDER — PROPOFOL 10 MG/ML IV BOLUS
INTRAVENOUS | Status: DC | PRN
  Administered 2017-09-09: 130 mg via INTRAVENOUS

## 2017-09-09 MED ORDER — KETOROLAC TROMETHAMINE 30 MG/ML IJ SOLN
30.0000 mg | Freq: Once | INTRAMUSCULAR | Status: DC | PRN
  Administered 2017-09-09: 30 mg via INTRAVENOUS

## 2017-09-09 MED ORDER — HYDROMORPHONE HCL 1 MG/ML IJ SOLN
0.2500 mg | INTRAMUSCULAR | Status: DC | PRN
  Administered 2017-09-09 (×4): 0.5 mg via INTRAVENOUS

## 2017-09-09 MED ORDER — ACETAMINOPHEN 325 MG PO TABS: 650.0000 mg | ORAL_TABLET | ORAL | Status: DC | PRN

## 2017-09-09 MED ORDER — BUPIVACAINE HCL (PF) 0.5 % IJ SOLN
INTRAMUSCULAR | Status: DC | PRN
  Administered 2017-09-09: 25 mL
  Administered 2017-09-09: 5 mL

## 2017-09-09 MED ORDER — SODIUM CHLORIDE 0.9% FLUSH
3.0000 mL | Freq: Two times a day (BID) | INTRAVENOUS | Status: DC
  Administered 2017-09-09: 3 mL via INTRAVENOUS

## 2017-09-09 MED ORDER — FENTANYL CITRATE (PF) 100 MCG/2ML IJ SOLN
INTRAMUSCULAR | Status: DC | PRN
  Administered 2017-09-09 (×3): 50 ug via INTRAVENOUS
  Administered 2017-09-09: 100 ug via INTRAVENOUS
  Administered 2017-09-09: 50 ug via INTRAVENOUS

## 2017-09-09 MED ORDER — HYDROMORPHONE HCL 1 MG/ML IJ SOLN
INTRAMUSCULAR | Status: AC
  Administered 2017-09-09: 0.5 mg via INTRAVENOUS
  Filled 2017-09-09: qty 1

## 2017-09-09 MED ORDER — DOCUSATE SODIUM 100 MG PO CAPS
100.0000 mg | ORAL_CAPSULE | Freq: Two times a day (BID) | ORAL | Status: DC
  Administered 2017-09-09 – 2017-09-11 (×4): 100 mg via ORAL
  Filled 2017-09-09 (×4): qty 1

## 2017-09-09 MED ORDER — PHENYLEPHRINE 40 MCG/ML (10ML) SYRINGE FOR IV PUSH (FOR BLOOD PRESSURE SUPPORT)
PREFILLED_SYRINGE | INTRAVENOUS | Status: DC | PRN
  Administered 2017-09-09 (×3): 80 ug via INTRAVENOUS

## 2017-09-09 MED ORDER — MIDAZOLAM HCL 5 MG/5ML IJ SOLN
INTRAMUSCULAR | Status: DC | PRN
Start: 2017-09-09 — End: 2017-09-09
  Administered 2017-09-09: 2 mg via INTRAVENOUS

## 2017-09-09 MED ORDER — THROMBIN 20000 UNITS EX SOLR
CUTANEOUS | Status: AC
  Filled 2017-09-09: qty 20000

## 2017-09-09 MED ORDER — B COMPLEX-C PO TABS
1.0000 | ORAL_TABLET | Freq: Every day | ORAL | Status: DC
  Administered 2017-09-10 – 2017-09-11 (×2): 1 via ORAL
  Filled 2017-09-09 (×2): qty 1

## 2017-09-09 MED ORDER — PHENOL 1.4 % MT LIQD: 1.0000 | OROMUCOSAL | Status: DC | PRN

## 2017-09-09 MED ORDER — LIDOCAINE-EPINEPHRINE 1 %-1:100000 IJ SOLN
INTRAMUSCULAR | Status: AC
  Filled 2017-09-09: qty 1

## 2017-09-09 MED ORDER — SODIUM CHLORIDE 0.9% FLUSH: 3.0000 mL | INTRAVENOUS | Status: DC | PRN

## 2017-09-09 MED ORDER — HYDROMORPHONE HCL 1 MG/ML IJ SOLN: 0.5000 mg | INTRAMUSCULAR | Status: DC | PRN

## 2017-09-09 MED ORDER — FLEET ENEMA 7-19 GM/118ML RE ENEM: 1.0000 | ENEMA | Freq: Once | RECTAL | Status: DC | PRN

## 2017-09-09 MED ORDER — PROPOFOL 10 MG/ML IV BOLUS
INTRAVENOUS | Status: AC
  Filled 2017-09-09: qty 20

## 2017-09-09 MED ORDER — LACTATED RINGERS IV SOLN: INTRAVENOUS | Status: DC

## 2017-09-09 MED ORDER — BISACODYL 10 MG RE SUPP: 10.0000 mg | Freq: Every day | RECTAL | Status: DC | PRN

## 2017-09-09 MED ORDER — METHOCARBAMOL 500 MG PO TABS
ORAL_TABLET | ORAL | Status: AC
  Administered 2017-09-09: 500 mg via ORAL
  Filled 2017-09-09: qty 1

## 2017-09-09 MED ORDER — SCOPOLAMINE 1 MG/3DAYS TD PT72
MEDICATED_PATCH | TRANSDERMAL | Status: DC | PRN
  Administered 2017-09-09: 1 via TRANSDERMAL

## 2017-09-09 MED ORDER — DULOXETINE HCL 30 MG PO CPEP
60.0000 mg | ORAL_CAPSULE | Freq: Every day | ORAL | Status: DC
  Administered 2017-09-10 – 2017-09-11 (×2): 60 mg via ORAL
  Filled 2017-09-09 (×2): qty 2

## 2017-09-09 MED ORDER — ONDANSETRON HCL 4 MG/2ML IJ SOLN
INTRAMUSCULAR | Status: DC | PRN
  Administered 2017-09-09: 4 mg via INTRAVENOUS

## 2017-09-09 MED ORDER — DEXAMETHASONE SODIUM PHOSPHATE 10 MG/ML IJ SOLN
INTRAMUSCULAR | Status: DC | PRN
  Administered 2017-09-09: 10 mg via INTRAVENOUS

## 2017-09-09 MED ORDER — MEPERIDINE HCL 25 MG/ML IJ SOLN: 6.2500 mg | INTRAMUSCULAR | Status: DC | PRN

## 2017-09-09 MED FILL — Heparin Sodium (Porcine) Inj 1000 Unit/ML: INTRAMUSCULAR | Qty: 30 | Status: AC

## 2017-09-09 MED FILL — Sodium Chloride IV Soln 0.9%: INTRAVENOUS | Qty: 1000 | Status: AC

## 2017-09-09 SURGICAL SUPPLY — 76 items
BAG DECANTER FOR FLEXI CONT (MISCELLANEOUS) ×3 IMPLANT
BASKET BONE COLLECTION (BASKET) ×3 IMPLANT
BLADE CLIPPER SURG (BLADE) IMPLANT
BUR MATCHSTICK NEURO 3.0 LAGG (BURR) ×3 IMPLANT
CANISTER SUCT 3000ML PPV (MISCELLANEOUS) ×3 IMPLANT
CAP LOCKING THREADED (Cap) ×18 IMPLANT
CARTRIDGE OIL MAESTRO DRILL (MISCELLANEOUS) IMPLANT
CONT SPEC 4OZ CLIKSEAL STRL BL (MISCELLANEOUS) ×3 IMPLANT
COVER BACK TABLE 60X90IN (DRAPES) ×3 IMPLANT
DECANTER SPIKE VIAL GLASS SM (MISCELLANEOUS) ×3 IMPLANT
DERMABOND ADVANCED (GAUZE/BANDAGES/DRESSINGS) ×2
DERMABOND ADVANCED .7 DNX12 (GAUZE/BANDAGES/DRESSINGS) ×1 IMPLANT
DEVICE DISSECT PLASMABLAD 3.0S (MISCELLANEOUS) ×1 IMPLANT
DIFFUSER DRILL AIR PNEUMATIC (MISCELLANEOUS) IMPLANT
DRAPE C-ARM 42X72 X-RAY (DRAPES) ×6 IMPLANT
DRAPE HALF SHEET 40X57 (DRAPES) ×3 IMPLANT
DRAPE LAPAROTOMY 100X72X124 (DRAPES) ×3 IMPLANT
DRAPE POUCH INSTRU U-SHP 10X18 (DRAPES) ×3 IMPLANT
DRSG OPSITE POSTOP 4X8 (GAUZE/BANDAGES/DRESSINGS) ×3 IMPLANT
DURAPREP 26ML APPLICATOR (WOUND CARE) ×3 IMPLANT
DURASEAL APPLICATOR TIP (TIP) IMPLANT
DURASEAL SPINE SEALANT 3ML (MISCELLANEOUS) IMPLANT
ELECT REM PT RETURN 9FT ADLT (ELECTROSURGICAL) ×3
ELECTRODE REM PT RTRN 9FT ADLT (ELECTROSURGICAL) ×1 IMPLANT
GAUZE SPONGE 4X4 12PLY STRL (GAUZE/BANDAGES/DRESSINGS) IMPLANT
GAUZE SPONGE 4X4 16PLY XRAY LF (GAUZE/BANDAGES/DRESSINGS) ×6 IMPLANT
GLOVE BIO SURGEON STRL SZ8 (GLOVE) ×3 IMPLANT
GLOVE BIO SURGEON STRL SZ8.5 (GLOVE) ×3 IMPLANT
GLOVE BIOGEL PI IND STRL 6.5 (GLOVE) ×2 IMPLANT
GLOVE BIOGEL PI IND STRL 7.0 (GLOVE) ×1 IMPLANT
GLOVE BIOGEL PI IND STRL 8 (GLOVE) ×4 IMPLANT
GLOVE BIOGEL PI IND STRL 8.5 (GLOVE) ×2 IMPLANT
GLOVE BIOGEL PI INDICATOR 6.5 (GLOVE) ×4
GLOVE BIOGEL PI INDICATOR 7.0 (GLOVE) ×2
GLOVE BIOGEL PI INDICATOR 8 (GLOVE) ×8
GLOVE BIOGEL PI INDICATOR 8.5 (GLOVE) ×4
GLOVE ECLIPSE 7.5 STRL STRAW (GLOVE) ×9 IMPLANT
GLOVE ECLIPSE 8.5 STRL (GLOVE) ×6 IMPLANT
GLOVE SURG SS PI 6.5 STRL IVOR (GLOVE) ×9 IMPLANT
GOWN STRL REUS W/ TWL LRG LVL3 (GOWN DISPOSABLE) ×2 IMPLANT
GOWN STRL REUS W/ TWL XL LVL3 (GOWN DISPOSABLE) ×1 IMPLANT
GOWN STRL REUS W/TWL 2XL LVL3 (GOWN DISPOSABLE) ×12 IMPLANT
GOWN STRL REUS W/TWL LRG LVL3 (GOWN DISPOSABLE) ×4
GOWN STRL REUS W/TWL XL LVL3 (GOWN DISPOSABLE) ×2
HEMOSTAT POWDER KIT SURGIFOAM (HEMOSTASIS) ×6 IMPLANT
KIT BASIN OR (CUSTOM PROCEDURE TRAY) ×3 IMPLANT
KIT ROOM TURNOVER OR (KITS) ×3 IMPLANT
MILL MEDIUM DISP (BLADE) ×3 IMPLANT
NEEDLE HYPO 22GX1.5 SAFETY (NEEDLE) ×3 IMPLANT
NEEDLE SPNL 18GX3.5 QUINCKE PK (NEEDLE) IMPLANT
NS IRRIG 1000ML POUR BTL (IV SOLUTION) ×3 IMPLANT
OIL CARTRIDGE MAESTRO DRILL (MISCELLANEOUS)
PACK LAMINECTOMY NEURO (CUSTOM PROCEDURE TRAY) ×3 IMPLANT
PAD ARMBOARD 7.5X6 YLW CONV (MISCELLANEOUS) ×15 IMPLANT
PATTIES SURGICAL .5 X1 (DISPOSABLE) ×6 IMPLANT
PATTIES SURGICAL 1X1 (DISPOSABLE) ×3 IMPLANT
PLASMABLADE 3.0S (MISCELLANEOUS) ×3
ROD 55MM (Rod) ×4 IMPLANT
ROD SPNL CVD 55X5.5XNS TI (Rod) ×2 IMPLANT
SCREW 6.5X45 (Screw) ×18 IMPLANT
SPACER SUSTAIN O 10X26 13MM (Spacer) ×6 IMPLANT
SPACER TPS 10X26 11MM OBLIQUE (Spacer) ×6 IMPLANT
SPONGE LAP 4X18 X RAY DECT (DISPOSABLE) ×3 IMPLANT
SPONGE SURGIFOAM ABS GEL 100 (HEMOSTASIS) ×3 IMPLANT
STRIP BIOACTIVE 20CC 25X100X8 (Miscellaneous) ×3 IMPLANT
SUT PROLENE 6 0 BV (SUTURE) ×3 IMPLANT
SUT VIC AB 1 CT1 18XBRD ANBCTR (SUTURE) ×2 IMPLANT
SUT VIC AB 1 CT1 8-18 (SUTURE) ×4
SUT VIC AB 2-0 CP2 18 (SUTURE) ×6 IMPLANT
SUT VIC AB 3-0 SH 8-18 (SUTURE) ×6 IMPLANT
SYR 3ML LL SCALE MARK (SYRINGE) ×12 IMPLANT
SYR 5ML LL (SYRINGE) IMPLANT
TOWEL GREEN STERILE (TOWEL DISPOSABLE) ×3 IMPLANT
TOWEL GREEN STERILE FF (TOWEL DISPOSABLE) ×3 IMPLANT
TRAY FOLEY W/METER SILVER 16FR (SET/KITS/TRAYS/PACK) ×3 IMPLANT
WATER STERILE IRR 1000ML POUR (IV SOLUTION) ×3 IMPLANT

## 2017-09-09 NOTE — H&P (Signed)
CHIEF COMPLAINT: Back pain with radiation into the left leg and now the right leg for about 4 month's time.  HISTORY OF PRESENT ILLNESS: Ellen Sawyer is a 61 year old right-handed person whom I have seen and treated back some 20 years ago for a ruptured disc in her lumbar spine.  She has done well, but she tells me that about 4 months ago she started developing severe pain in the back with radiation to the left lower extremity down the posterior thigh and into the leg.  This gradually seemed to get better, but then about the same time she started developing pain in the right lower extremity, particularly around the area of the hip.  She has been extremely uncomfortable.  She has had an MRI of the lumbar spine performed recently and this study demonstrates that she has auto fused L5-S1, but she has a degenerative listhesis at L4-L5 and at L3-L4 with moderately severe stenosis at each of these levels.  She also has a far lateral extraforaminal disc at L4-5 that elevates the path of the L4 nerve root outside the canal.  She notes that despite the passage of time, symptoms seem to be getting worse.  She cannot sleep at night.  It wakes her up and she finds that moving into and out of bed and turning in bed is extremely uncomfortable.  This also has given her a sensation that her right leg is going to give way unexpectedly, and she notes that sensation goes through the entirety of the leg, not just the ankle or the knee, but the leg from the region of the hip. over the past several months she has had a couple of epidural injections and physical therapy with no relief and some exacerbation at times. A myelogram has recently been performed and surgery has been discussed and she is now admitted for that procedure/    Her past medical history reveals that her general health has been good.  She tells me that she got married in the past year and overall has been doing well.  She is not taking any significant pain medications, but  she has been on thyroid and Cymbalta chronically.  PHYSICAL EXAMINATION: I note that she stands straight and erect but walks with marked antalgia involving that right lower extremity.  She appears to have global weakness in the iliopsoas to quads, tibialis anterior and gastroc all graded at 4-/5.  Deep tendon reflexes, however, are preserved in the patellae and the Achilles on both sides.  Straight leg raising, however, is markedly positive on the right side at 15 degrees and Patrick's maneuver is also positive on that right side.   To further her workup today I obtained a lateral flexion-extension of the lumbar spine in addition to an AP of the pelvis.  The pelvis film demonstrates that both her hip joints appear to be intact with the left hip showing some more arthritic changes than the right.  On the flexion-extension film she clearly has a grade 1 spondylolisthesis at L3-4 and at L4-5, which is more accentuated on the plain x-ray in flexion than it was on the MRI in her upper lumbar spine.  This likely exacerbates the degree of stenosis that she has at each of L3-4 and L4-5.  The myelogram demonstrates that she has significant spondylitic stenosis at L3-4 and L4-5, she has an auto fused L5-S1, and the biggest problem is that she has significant facet hypertrophy with severe facet arthropathy at L4-5 and L3-4 that contribute to  cause a moderate degree of central canal stenosis.  In addition, on the left side she has foraminal protrusion of the disc at the L4-5 level.  Though most of her lumbar radicular symptoms are on the left side, I believe that a simple decompression is likely to only yield further loosening of the spine and worsening spondylolisthesis.  She already has a grade 1 spondylolisthesis at L4-L5.  At this point, I have advised Ellen Sawyer that she will need surgical decompression and stabilization at L3-4 and L4-5 with pedicle screw fixation from L3 to L5 and posterior interbody arthrodesis at  the 3-4 and 4-5 spaces.  I noted that the usual hospital stay can involve a stay up to about 3 days, she will need to wear an external corset for a period of about 6 to 8 weeks after surgery, and I would advise a period of time out of work of about 3 months to allow herself to recuperate adequately.  Ellen Sawyer has had some micro surgeries in the past that she has tolerated well, but at this point I do not believe that a micro surgery is going to yield the type of relief that she needs.  She has been having difficulties with leg weakness and a sensation that she is falling, and at this point this is the surgery that is recommended.She is now admitted for surgery.

## 2017-09-09 NOTE — Progress Notes (Signed)
Vital signs stable. Patient is awakening doing well. Motor function is intact. Dressing is dry

## 2017-09-09 NOTE — Op Note (Signed)
Date of surgery: 09/09/2017 Preoperative diagnosis: Spondylolisthesis L4-L5 with lumbar radiculopathy and spondylosis listhesis and stenosis L3-L4 with neurogenic claudication and lumbar radiculopathy Postoperative diagnosis: Same Procedure: Bilateral laminectomies L3-L4 total laminectomy L4 with decompression of the L3 the L4 and the L5 nerve roots, more work than that required for simple interbody arthrodesis. Posterior lumbar interbody arthrodesis using peek spacers local autograft and allograft L3-4 and L4-5. Posterior lateral arthrodesis with local autograft and allograft L3-L5, segmental fixation L3-L5 with pedicle screws. Surgeon: Barnett Abu First assistant: Tressie Stalker M.D. Anesthesia: Gen. endotracheal Indications: Ellen Sawyer is a 61 year old individual who's had significant back and initially left leg pain now right leg pain. [name] had a progressive spondylolisthesis and has failed efforts at conservative management. Myelogram demonstrated the progression of a significant spondylolisthesis and stenosis worse with standing in extension. She is advised regarding the need for surgical decompression and stabilization.  Procedure: The patient was brought to the operating room supine on a stretcher. After the smooth induction of general endotracheal anesthesia, she was turned prone. The back was prepped with alcohol and DuraPrep and draped in a sterile fashion. A midline incision was created and carried down to the lumbar dorsal fascia. The first identifiable spinous processes were noted to be that of L3 and L4. Then a subperiosteal dissection was carried out over the facet joints at L3-4 and L4-5. The lateral gutters were decorticated isolating the transverse processes of L5 L4 and L3. These were packed away with gauze sponges. Laminectomy was then created removing the inferior marginal lamina of L3 out to and including the entirety of the L3-4 facet bilaterally. Then the entire laminar  arch of L4 was removed with the entirety of the facet at L4-L5 being exposed. The yellow ligament was taken up. The lateral recesses were then decompressed and care was taken to protect each of the L3 the L4 and L5 nerve roots as the decompression was undertaken using the #2, 3 and 4 mm Kerrison punch. Once the decompression was completed, the disc spaces were entered and a combination of curettes rongeurs and disc shavers were used to remove the entirety of the disc contents. The interbody space was completely de-needed and decorticated. The space was then sized for appropriate size spacer and is felt that a 13 x 26 x 10 mm tall sustain spacer would fill the interval into these were placed in L4 and L5. L3 and L4 similar process was carried out and here a TPS sustain spacer measuring 11 x 26 x 10 mm with 7 of lordosis was placed into the interval. A total of 6 mL of autograft was placed into the interval at L3-4 and 6 mL at L4-5. Once the posterior body procedure was completed pedicle entry sites were chosen at L3-L4 and L5. Prior to doing this the remainder of the kinex bone graft was placed into strips at the lateral gutters along with the remainder of autograft allograft combination. Pedicle entry sites were then chosen at L3-L4 and L5 and 6.5 x 45 mm screws were placed there precontoured 55 mm rods were placed between the screw heads. These were placed in a neutral construct and final radiographs identified good position of the screws with good reduction and maintenance of stable normal lordosis. Hemostasis was then checked and the soft tissues and care was taken to make sure that the L3 the L4 and L5 nerve roots remained and were well decompressed with this the lumbar dorsal fascia was closed with #1 Vicryl in interrupted fashion  2-0 Vicryl was used in subcutaneous anus tissues and 3-0 Vicryl subcuticularly. Dermabond was placed on the skin. Blood loss is estimated 300 mL. Patient tolerated procedure was  returned to recovery room in stable condition.

## 2017-09-09 NOTE — Evaluation (Signed)
Physical Therapy Evaluation Patient Details Name: Ellen Sawyer MRN: [id number] DOB: 10/30/1956 Today's Date: 09/09/2017   History of Present Illness  Pt is a 61 y/o female s/p L3-5 PLIF. PMH includes asthma and arthritis.   Clinical Impression  Pt s/p surgery above with deficits below. PTA, pt was independent with mobility, however, reports multiple falls secondary to LLE giving out on her. Upon eval, pt very unsteady without use of AD and required min A for steadying. Upon use of RW, pt able to decrease assist to min guard for safety. Reports husband will be available to assist as needed upon d/c. Pt will need DME below and would benefit from outpatient PT once cleared from precautions by MD in order to increase balance and stability with mobility. Will continue to follow acutely to maximize functional mobility independence and safety.     Follow Up Recommendations Outpatient PT;Supervision for mobility/OOB (following MD clearance )    Equipment Recommendations  Rolling walker with 5" wheels;3in1 (PT) (youth RW )    Recommendations for Other Services       Precautions / Restrictions Precautions Precautions: Fall;Back Precaution Booklet Issued: Yes (comment) Precaution Comments: Reviewed back precaution handout with pt. Pt also reporting history of falls with most recent being 3 weeks ago. Reports LLE gives out on her.  Required Braces or Orthoses: Spinal Brace Spinal Brace: Lumbar corset;Applied in sitting position Restrictions Weight Bearing Restrictions: No      Mobility  Bed Mobility Overal bed mobility: Needs Assistance Bed Mobility: Rolling;Sidelying to Sit;Sit to Sidelying Rolling: Supervision Sidelying to sit: Supervision     Sit to sidelying: Supervision General bed mobility comments: Supervision to ensure log roll technique. Increased time required secondary to pain.   Transfers Overall transfer level: Needs assistance Equipment used: None Transfers: Sit  to/from Stand Sit to Stand: Min assist         General transfer comment: Min A for steadying assist. Verbal cues to power through BLE.   Ambulation/Gait Ambulation/Gait assistance: Min assist;Min guard Ambulation Distance (Feet): 400 Feet Assistive device: Rolling walker (2 wheeled);1 person hand held assist Gait Pattern/deviations: Step-through pattern;Decreased stride length Gait velocity: Decreased Gait velocity interpretation: Below normal speed for age/gender General Gait Details: Slow, unsteady gait with HHA. Noted instability in RLE secondary to numbness and weakness. With use of RW, pt reported increased stability and decreased assist level to min guard for safety. Verbal cues for proximity to device and upright posture.   Stairs            Wheelchair Mobility    Modified Rankin (Stroke Patients Only)       Balance Overall balance assessment: Needs assistance Sitting-balance support: No upper extremity supported;Feet supported Sitting balance-Leahy Scale: Good     Standing balance support: Bilateral upper extremity supported;No upper extremity supported;During functional activity Standing balance-Leahy Scale: Fair Standing balance comment: Able to maintain static standing without UE support                              Pertinent Vitals/Pain Pain Assessment: No/denies pain Pain Score: 8  Pain Location: back  Pain Descriptors / Indicators: Aching;Operative site guarding Pain Intervention(s): Monitored during session;Limited activity within patient's tolerance;Repositioned    Home Living Family/patient expects to be discharged to:: Private residence Living Arrangements: Spouse/significant other Available Help at Discharge: Family;Available 24 hours/day Type of Home: House Home Access: Stairs to enter Entrance Stairs-Rails: None Entrance Stairs-Number of Steps: 4 Home  Layout: One level Home Equipment: None      Prior Function Level of  Independence: Independent         Comments: Reports she did not use AD, however, did report multiple falls      Hand Dominance        Extremity/Trunk Assessment   Upper Extremity Assessment Upper Extremity Assessment: Defer to OT evaluation    Lower Extremity Assessment Lower Extremity Assessment: LLE deficits/detail LLE Deficits / Details: LLE numbness reported at baseline. Noted functional weakness during ambulation as well.     Cervical / Trunk Assessment Cervical / Trunk Assessment: Other exceptions Cervical / Trunk Exceptions: s/p PLIF   Communication   Communication: No difficulties  Cognition Arousal/Alertness: Awake/alert Behavior During Therapy: WFL for tasks assessed/performed Overall Cognitive Status: Within Functional Limits for tasks assessed                                        General Comments General comments (skin integrity, edema, etc.): Pt husband present during session. Educated about generalized walking program to perform at home and use of RW to increase stability.     Exercises     Assessment/Plan    PT Assessment Patient needs continued PT services  PT Problem List Decreased strength;Decreased mobility;Decreased balance;Decreased knowledge of use of DME;Decreased knowledge of precautions;Impaired sensation;Pain       PT Treatment Interventions DME instruction;Gait training;Stair training;Functional mobility training;Balance training;Therapeutic exercise;Therapeutic activities;Neuromuscular re-education;Patient/family education    PT Goals (Current goals can be found in the Care Plan section)  Acute Rehab PT Goals Patient Stated Goal: to decrease pain  PT Goal Formulation: With patient Time For Goal Achievement: 09/16/17 Potential to Achieve Goals: Good    Frequency Min 5X/week   Barriers to discharge        Co-evaluation               AM-PAC PT "6 Clicks" Daily Activity  Outcome Measure Difficulty  turning over in bed (including adjusting bedclothes, sheets and blankets)?: None Difficulty moving from lying on back to sitting on the side of the bed? : None Difficulty sitting down on and standing up from a chair with arms (e.g., wheelchair, bedside commode, etc,.)?: Unable Help needed moving to and from a bed to chair (including a wheelchair)?: A Little Help needed walking in hospital room?: A Little Help needed climbing 3-5 steps with a railing? : A Little 6 Click Score: 18    End of Session Equipment Utilized During Treatment: Gait belt;Back brace Activity Tolerance: Patient tolerated treatment well Patient left: in bed;with call bell/phone within reach;with family/visitor present Nurse Communication: Mobility status PT Visit Diagnosis: Unsteadiness on feet (R26.81);Other symptoms and signs involving the nervous system (R29.898);History of falling (Z91.81);Muscle weakness (generalized) (M62.81);Pain Pain - part of body:  (back )    Time: 1610-9604 PT Time Calculation (min) (ACUTE ONLY): 26 min   Charges:   PT Evaluation $PT Eval Low Complexity: 1 Low PT Treatments $Gait Training: 8-22 mins   PT G Codes:        Gladys Damme, PT, DPT  Acute Rehabilitation Services  Pager: [phone]   Lehman Prom 09/09/2017, 5:35 PM

## 2017-09-09 NOTE — Transfer of Care (Signed)
Immediate Anesthesia Transfer of Care Note  Patient: Ellen Sawyer  Procedure(s) Performed: Lumbar Three-Four,Lumbar Four-Five Posterior lumbar interbody fusion (N/A Spine Lumbar)  Patient Location: PACU  Anesthesia Type:General  Level of Consciousness: awake, alert  and oriented  Airway & Oxygen Therapy: Patient Spontanous Breathing and Patient connected to nasal cannula oxygen  Post-op Assessment: Report given to RN and Post -op Vital signs reviewed and stable  Post vital signs: Reviewed and stable  Last Vitals:  Vitals:   09/09/17 0609  BP: 137/82  Pulse: 78  Resp: 19  Temp: 36.7 C  SpO2: 97%    Last Pain:  Vitals:   09/09/17 0634  TempSrc:   PainSc: 5       Patients Stated Pain Goal: 3 (09/09/17 0981)  Complications: No apparent anesthesia complications

## 2017-09-09 NOTE — Anesthesia Procedure Notes (Signed)
Procedure Name: Intubation Date/Time: 09/09/2017 7:45 AM Performed by: Carney Living Pre-anesthesia Checklist: Patient identified, Emergency Drugs available, Suction available, Patient being monitored and Timeout performed Patient Re-evaluated:Patient Re-evaluated prior to induction Oxygen Delivery Method: Circle system utilized Preoxygenation: Pre-oxygenation with 100% oxygen Induction Type: IV induction Ventilation: Mask ventilation without difficulty Laryngoscope Size: Mac and 4 Grade View: Grade I Tube type: Oral Tube size: 7.0 mm Number of attempts: 1 Airway Equipment and Method: Stylet Placement Confirmation: ETT inserted through vocal cords under direct vision,  positive ETCO2 and breath sounds checked- equal and bilateral Secured at: 20 cm Tube secured with: Tape Dental Injury: Teeth and Oropharynx as per pre-operative assessment

## 2017-09-10 LAB — BASIC METABOLIC PANEL
Anion gap: 7 (ref 5–15)
BUN: 14 mg/dL (ref 6–20)
CHLORIDE: 107 mmol/L (ref 101–111)
CO2: 28 mmol/L (ref 22–32)
Calcium: 8.5 mg/dL — ABNORMAL LOW (ref 8.9–10.3)
Creatinine, Ser: 0.93 mg/dL (ref 0.44–1.00)
GFR calc non Af Amer: >60 mL/min (ref >60)
Glucose, Bld: 109 mg/dL — ABNORMAL HIGH (ref 65–99)
POTASSIUM: 4.1 mmol/L (ref 3.5–5.1)
SODIUM: 142 mmol/L (ref 135–145)

## 2017-09-10 LAB — CBC
HCT: 29.3 % — ABNORMAL LOW (ref 36.0–46.0)
HEMOGLOBIN: 9.1 g/dL — AB (ref 12.0–15.0)
MCH: 26.8 pg (ref 26.0–34.0)
MCHC: 31.1 g/dL (ref 30.0–36.0)
MCV: 86.4 fL (ref 78.0–100.0)
Platelets: 198 10*3/uL (ref 150–400)
RBC: 3.39 MIL/uL — AB (ref 3.87–5.11)
RDW: 15 % (ref 11.5–15.5)
WBC: 12.4 10*3/uL — ABNORMAL HIGH (ref 4.0–10.5)

## 2017-09-10 NOTE — Anesthesia Postprocedure Evaluation (Signed)
Anesthesia Post Note  Patient: Ellen Sawyer  Procedure(s) Performed: Lumbar Three-Four,Lumbar Four-Five Posterior lumbar interbody fusion (N/A Spine Lumbar)     Patient location during evaluation: PACU Anesthesia Type: General Level of consciousness: awake Pain management: pain level controlled Vital Signs Assessment: post-procedure vital signs reviewed and stable Respiratory status: spontaneous breathing Cardiovascular status: stable Postop Assessment: no apparent nausea or vomiting Anesthetic complications: no    Last Vitals:  Vitals:   09/10/17 0619 09/10/17 0739  BP: 103/61 (!) 91/50  Pulse:  79  Resp:  18  Temp:  36.8 C  SpO2:  98%    Last Pain:  Vitals:   09/10/17 0739  TempSrc: Oral  PainSc:    Pain Goal: Patients Stated Pain Goal: 3 (09/10/17 0508)               Renia Mikelson JR,JOHN Susann Givens

## 2017-09-10 NOTE — Progress Notes (Signed)
Vital signs are normal Patient ambulating well Motor function good Dressing clean and dry Plan discharge for tomorrow

## 2017-09-10 NOTE — Progress Notes (Signed)
OT Note    09/10/17 1100  OT Visit Information  Last OT Received On 09/10/17  OT Time Calculation  OT Start Time (ACUTE ONLY) 0903  OT Stop Time (ACUTE ONLY) 1610  OT Time Calculation (min) 18 min  OT General Charges  $OT Visit 1 Visit  OT Evaluation  $OT Eval Low Complexity 1 Low  [facility], OT/L  [phone] 09/10/2017

## 2017-09-10 NOTE — Progress Notes (Signed)
Physical Therapy Treatment Patient Details Name: Ellen Sawyer MRN: [id number] DOB: 11/05/1956 Today's Date: 09/10/2017    History of Present Illness Pt is a 61 y/o female s/p L3-5 PLIF. PMH includes asthma and arthritis.     PT Comments    Pt progressing towards physical therapy goals. Was able to perform transfers and ambulation with gross supervision for safety and RW for BUE support. Attempted SPC however pt prefers RW. Plan to attempt stairs once more and practice bed mobility again prior to d/c tomorrow.    Follow Up Recommendations  Outpatient PT;Supervision for mobility/OOB (when appropriate per post-op protocol )     Equipment Recommendations  Rolling walker with 5" wheels;3in1 (PT) (youth RW )    Recommendations for Other Services       Precautions / Restrictions Precautions Precautions: Fall;Back Precaution Booklet Issued: Yes (comment) Precaution Comments: Reviewed back precaution handout with pt. Pt also reporting history of falls with most recent being 3 weeks ago. Reports RLE gives out on her.  Required Braces or Orthoses: Spinal Brace Spinal Brace: Lumbar corset;Applied in sitting position Restrictions Weight Bearing Restrictions: No    Mobility  Bed Mobility Overal bed mobility: Needs Assistance Bed Mobility: Rolling;Sidelying to Sit;Sit to Sidelying Rolling: Modified independent (Device/Increase time) Sidelying to sit: Supervision       General bed mobility comments: VC's for log roll technique. Pt was able to complete without assistance.   Transfers Overall transfer level: Needs assistance Equipment used: None Transfers: Sit to/from Stand Sit to Stand: Supervision         General transfer comment: VC's for hand placement on seated surface for safety. Pt initially attempting to stand before walker was in front of her, and required cues to stop and wait.   Ambulation/Gait Ambulation/Gait assistance: Supervision Ambulation Distance (Feet):  400 Feet Assistive device: Rolling walker (2 wheeled) Gait Pattern/deviations: Step-through pattern;Decreased stride length Gait velocity: Decreased Gait velocity interpretation: Below normal speed for age/gender General Gait Details: RW for stability. Attempted with SPC however pt preferred RW. Pt was able to ambulate well with no noted LOB (with BUE support)  Stairs: Pt was able to perform 4 stairs x2 with HHA for support. Practiced initially with rails and then with HHA only to simulate home environment.    Stairs            Wheelchair Mobility    Modified Rankin (Stroke Patients Only)       Balance Overall balance assessment: Needs assistance Sitting-balance support: No upper extremity supported;Feet supported Sitting balance-Leahy Scale: Good     Standing balance support: Bilateral upper extremity supported;No upper extremity supported;During functional activity Standing balance-Leahy Scale: Fair Standing balance comment: Able to maintain static standing without UE support                             Cognition Arousal/Alertness: Awake/alert Behavior During Therapy: WFL for tasks assessed/performed Overall Cognitive Status: Within Functional Limits for tasks assessed                                        Exercises      General Comments        Pertinent Vitals/Pain Pain Assessment: 0-10 Pain Score: 6  Pain Location: back  Pain Descriptors / Indicators: Aching;Operative site guarding Pain Intervention(s): Limited activity within patient's tolerance;Monitored during session;Repositioned  Home Living                      Prior Function            PT Goals (current goals can now be found in the care plan section) Acute Rehab PT Goals Patient Stated Goal: to decrease pain  PT Goal Formulation: With patient Time For Goal Achievement: 09/16/17 Potential to Achieve Goals: Good Progress towards PT goals: Progressing  toward goals    Frequency    Min 5X/week      PT Plan Current plan remains appropriate    Co-evaluation              AM-PAC PT "6 Clicks" Daily Activity  Outcome Measure  Difficulty turning over in bed (including adjusting bedclothes, sheets and blankets)?: None Difficulty moving from lying on back to sitting on the side of the bed? : None Difficulty sitting down on and standing up from a chair with arms (e.g., wheelchair, bedside commode, etc,.)?: None Help needed moving to and from a bed to chair (including a wheelchair)?: None Help needed walking in hospital room?: None Help needed climbing 3-5 steps with a railing? : None 6 Click Score: 24    End of Session Equipment Utilized During Treatment: Gait belt;Back brace Activity Tolerance: Patient tolerated treatment well Patient left: in bed;with call bell/phone within reach;with family/visitor present Nurse Communication: Mobility status PT Visit Diagnosis: Unsteadiness on feet (R26.81);Other symptoms and signs involving the nervous system (R29.898);History of falling (Z91.81);Muscle weakness (generalized) (M62.81);Pain Pain - part of body:  (back )     Time: 0752-0820 PT Time Calculation (min) (ACUTE ONLY): 28 min  Charges:  $Gait Training: 23-37 mins                    G Codes:       Conni Slipper, PT, DPT Acute Rehabilitation Services Pager: [phone]    Marylynn Pearson 09/10/2017, 8:27 AM

## 2017-09-10 NOTE — Therapy (Signed)
Occupational Therapy Evaluation and Discharge Patient Details Name: Ellen Sawyer MRN: [id number] DOB: December 26, 1955 Today's Date: 09/10/2017    History of Present Illness Pt is a 61 y/o female s/p L3-5 PLIF. PMH includes asthma and arthritis.    Clinical Impression   Pt reports being independent in ADLs, IADLs, and was working full time PTA. Currently, pt requires min assist with LB ADLs, peri care and tub shower transfer for safety. Pt requires supervision for functional mobility at RW level and with all other ADLs. Pt reports spouse is able to provide 24 hour assistance as needed upon d/c home. Pt provided all acute OT education and declines education on AE as husband will be available to assist. No further acute OT needs at this time. Pt safe to d/c home when medically allowed with supervision and assistance from husband. OT signing off.     Follow Up Recommendations  No OT follow up;Supervision - Intermittent    Equipment Recommendations  3 in 1 bedside commode    Recommendations for Other Services       Precautions / Restrictions Precautions Precautions: Fall;Back Precaution Booklet Issued: Yes (comment) Precaution Comments: Reviewed back precaution handout. Pt able to independently verbalize 3/3 back precautions.  Required Braces or Orthoses: Spinal Brace Spinal Brace: Lumbar corset;Applied in sitting position Restrictions Weight Bearing Restrictions: No      Mobility Bed Mobility Overal bed mobility: Needs Assistance          General bed mobility comments: Pt sitting in chair upon arrival. Pt able to verbalize log roll technique  Transfers Overall transfer level: Needs assistance Equipment used: Rolling walker (2 wheeled) Transfers: Sit to/from Stand Sit to Stand: Supervision         General transfer comment: Pt able to maintain precautions during sit to stand transfer.     Balance Overall balance assessment: Needs assistance Sitting-balance  support: No upper extremity supported;Feet supported Sitting balance-Leahy Scale: Good     Standing balance support: Bilateral upper extremity supported;No upper extremity supported;During functional activity Standing balance-Leahy Scale: Fair Standing balance comment: Able to maintain static standing without UE support                            ADL either performed or assessed with clinical judgement   ADL Overall ADL's : Needs assistance/impaired     Grooming: Supervision/safety;Set up;Standing;Oral care Grooming Details (indicate cue type and reason): Pt educated on two cup technqiue to maintain back precautions during oral care.  Upper Body Bathing: Supervision/ safety;Set up;Sitting   Lower Body Bathing: Minimal assistance;Sit to/from stand Lower Body Bathing Details (indicate cue type and reason): Pt currently unable to bring feet over knees. Pt declines education on AE and reports that her spouse is able to help as needed.  Upper Body Dressing : Supervision/safety;Set up;Sitting Upper Body Dressing Details (indicate cue type and reason): Pt reports spouse will be able to assist with donning back brace as needed. Pt able to verbalize wear schedule for brace.  Lower Body Dressing: Minimal assistance;Sit to/from stand Lower Body Dressing Details (indicate cue type and reason): Pt currently unable to bring feet over knees. Pt declines education on AE and reports that her spouse is able to help as needed.  Toilet Transfer: Supervision/safety;Ambulation;Comfort height toilet;RW Statistician Details (indicate cue type and reason): Pt able to complete toilet transfer with supervision for safety.  Toileting- Clothing Manipulation and Hygiene: Minimal assistance;Sit to/from stand Therapist, nutritional  Details (indicate cue type and reason): Pt educated on peri care with back precautions. Pt reports that spouse will be available to assist if needed.  Tub/ Shower  Transfer: Tub transfer;Minimal assistance;Ambulation;Rolling walker;3 in 1;Grab bars Tub/Shower Transfer Details (indicate cue type and reason): Pt demonstrated technique for getting in/out of the shower with grab bars that she has at home. Pt reports husband will be there to help her get in and out of the shower.  Functional mobility during ADLs: Supervision/safety;Rolling walker General ADL Comments: Pt educated on compensatory techniques for ADLs with back precautions.      Vision         Perception     Praxis      Pertinent Vitals/Pain Pain Assessment: No/denies pain Pain Score: 4  Pain Location: back  Pain Descriptors / Indicators: Aching;Operative site guarding Pain Intervention(s): Monitored during session;Premedicated before session     Hand Dominance     Extremity/Trunk Assessment Upper Extremity Assessment Upper Extremity Assessment: Overall WFL for tasks assessed   Lower Extremity Assessment Lower Extremity Assessment: Defer to PT evaluation   Cervical / Trunk Assessment Cervical / Trunk Assessment: Other exceptions Cervical / Trunk Exceptions: s/p PLIF    Communication Communication Communication: No difficulties   Cognition Arousal/Alertness: Awake/alert Behavior During Therapy: WFL for tasks assessed/performed Overall Cognitive Status: Within Functional Limits for tasks assessed                                     General Comments  Pt reports that her primary concerns for d/c home have been addressed and that she has no questions at this time.     Exercises     Shoulder Instructions      Home Living Family/patient expects to be discharged to:: Private residence Living Arrangements: Spouse/significant other Available Help at Discharge: Family;Available 24 hours/day Type of Home: House Home Access: Stairs to enter [employer] of Steps: 4 Entrance Stairs-Rails: None Home Layout: One level     Bathroom Shower/Tub:  IT trainer: Standard Bathroom Accessibility: Yes How Accessible: Accessible via walker Home Equipment: None          Prior Functioning/Environment Level of Independence: Independent        Comments: Pt works full time at a doctor's office. Was active at home (cutting grass, cooking, cleaning) and was driving PTA. Pt reports no use of AD PTA but reports multiple falls. Encouraged pt to use RW for safety with functional mobility.         OT Problem List:  Decreased knowledge of precautions; Pain      OT Treatment/Interventions:      OT Goals(Current goals can be found in the care plan section) Acute Rehab OT Goals Patient Stated Goal: To continue to make progress  OT Goal Formulation: With patient  OT Frequency:     Barriers to D/C:            Co-evaluation              AM-PAC PT "6 Clicks" Daily Activity     Outcome Measure Help from another person eating meals?: None Help from another person taking care of personal grooming?: None Help from another person toileting, which includes using toliet, bedpan, or urinal?: A Little Help from another person bathing (including washing, rinsing, drying)?: A Little Help from another person to put on and taking off regular upper body  clothing?: None Help from another person to put on and taking off regular lower body clothing?: A Little 6 Click Score: 21   End of Session Equipment Utilized During Treatment: Gait belt;Rolling walker;Back brace Nurse Communication: Mobility status  Activity Tolerance: Patient tolerated treatment well Patient left: in chair;with call bell/phone within reach  OT Visit Diagnosis: Unsteadiness on feet (R26.81);Pain Pain - part of body:  (Back)                Time: 4098-1191 OT Time Calculation (min): 18 min Charges:    G-Codes:     Cammy Copa, OTS [phone]   Cammy Copa 09/10/2017, 9:44 AM

## 2017-09-11 MED ORDER — OXYCODONE-ACETAMINOPHEN 5-325 MG PO TABS: 1.0000 | ORAL_TABLET | ORAL | 0 refills | Status: DC | PRN

## 2017-09-11 MED ORDER — METHOCARBAMOL 500 MG PO TABS: 500.0000 mg | ORAL_TABLET | Freq: Four times a day (QID) | ORAL | 3 refills | Status: DC | PRN

## 2017-09-11 NOTE — Progress Notes (Signed)
Physical Therapy Treatment Patient Details Name: Ellen Sawyer MRN: [id number] DOB: [birth year] Today's Date: 09/11/2017    History of Present Illness Pt is a 61 y/o female s/p L3-5 PLIF. PMH includes asthma and arthritis.     PT Comments    Pt progressing towards physical therapy goals. Was able to perform transfers and ambulation with gross supervision for safety to modified independence with RW for support. Pt was educated on precautions, walking program, brace application/wearing schedule, car transfer, and general safety with activity progression. Will continue to follow and progress as able per POC.    Follow Up Recommendations  Outpatient PT;Supervision for mobility/OOB (when appropriate per post-op protocol )     Equipment Recommendations  Rolling walker with 5" wheels;3in1 (PT) (youth RW )    Recommendations for Other Services       Precautions / Restrictions Precautions Precautions: Fall;Back Precaution Booklet Issued: Yes (comment) Precaution Comments: Pt was cued for precautions during functional mobility. Pt was able to recall 3/3 precautions without assist.  Required Braces or Orthoses: Spinal Brace Spinal Brace: Lumbar corset;Applied in sitting position Restrictions Weight Bearing Restrictions: No    Mobility  Bed Mobility Overal bed mobility: Modified Independent Bed Mobility: Rolling;Sidelying to Sit;Sit to Sidelying Rolling: Modified independent (Device/Increase time) Sidelying to sit: Modified independent (Device/Increase time)     Sit to sidelying: Modified independent (Device/Increase time) General bed mobility comments: HOB flat and rails lowered to simulate home environment.   Transfers Overall transfer level: Modified independent Equipment used: Rolling walker (2 wheeled) Transfers: Sit to/from Stand           General transfer comment: VC's for hand placement on seated surface for safety.   Ambulation/Gait Ambulation/Gait  assistance: Supervision Ambulation Distance (Feet): 400 Feet Assistive device: Rolling walker (2 wheeled) Gait Pattern/deviations: Step-through pattern;Decreased stride length Gait velocity: Decreased Gait velocity interpretation: Below normal speed for age/gender General Gait Details: RW for added stability. Pt was able to ambulate well without assistance.    Stairs Stairs: Yes   Stair Management: One rail Right;Step to pattern;Forwards Number of Stairs: 4 General stair comments: HHA provided on L as well. Pt was able to demonstrate proper sequencing and general safety.   Wheelchair Mobility    Modified Rankin (Stroke Patients Only)       Balance Overall balance assessment: Needs assistance Sitting-balance support: No upper extremity supported;Feet supported Sitting balance-Leahy Scale: Good     Standing balance support: Bilateral upper extremity supported;No upper extremity supported;During functional activity Standing balance-Leahy Scale: Fair Standing balance comment: Able to maintain static standing without UE support                             Cognition Arousal/Alertness: Awake/alert Behavior During Therapy: WFL for tasks assessed/performed Overall Cognitive Status: Within Functional Limits for tasks assessed                                        Exercises      General Comments        Pertinent Vitals/Pain Pain Assessment: Faces Faces Pain Scale: Hurts a little bit Pain Location: back  Pain Descriptors / Indicators: Aching;Operative site guarding Pain Intervention(s): Limited activity within patient's tolerance;Monitored during session;Repositioned    Home Living  Prior Function            PT Goals (current goals can now be found in the care plan section) Acute Rehab PT Goals Patient Stated Goal: To continue to make progress  PT Goal Formulation: With patient Time For Goal Achievement:  09/16/17 Potential to Achieve Goals: Good Progress towards PT goals: Progressing toward goals    Frequency    Min 5X/week      PT Plan Current plan remains appropriate    Co-evaluation              AM-PAC PT "6 Clicks" Daily Activity  Outcome Measure  Difficulty turning over in bed (including adjusting bedclothes, sheets and blankets)?: None Difficulty moving from lying on back to sitting on the side of the bed? : None Difficulty sitting down on and standing up from a chair with arms (e.g., wheelchair, bedside commode, etc,.)?: None Help needed moving to and from a bed to chair (including a wheelchair)?: None Help needed walking in hospital room?: None Help needed climbing 3-5 steps with a railing? : None 6 Click Score: 24    End of Session Equipment Utilized During Treatment: Gait belt;Back brace Activity Tolerance: Patient tolerated treatment well Patient left: in bed;with call bell/phone within reach;with family/visitor present Nurse Communication: Mobility status PT Visit Diagnosis: Unsteadiness on feet (R26.81);Other symptoms and signs involving the nervous system (R29.898);History of falling (Z91.81);Muscle weakness (generalized) (M62.81);Pain Pain - part of body:  (back )     Time: 5366-4403 PT Time Calculation (min) (ACUTE ONLY): 17 min  Charges:  $Gait Training: 8-22 mins                    G Codes:       Conni Slipper, PT, DPT Acute Rehabilitation Services Pager: [phone]    Marylynn Pearson 09/11/2017, 9:13 AM

## 2017-09-11 NOTE — Progress Notes (Signed)
Patient alert and oriented, mae's well, voiding adequate amount of urine, swallowing without difficulty, no c/o pain at time of discharge. Patient discharged home with family. Script and discharged instructions given to patient. Patient and family stated understanding of instructions given. Patient has an appointment with Dr. Elsner  

## 2017-09-11 NOTE — Discharge Summary (Signed)
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 History of discectomy and fusion L5-S1. Discharge and final diagnosis: Spondylolisthesis and stenosis L3-4, L4-5 with lumbar radiculopathy. History of discectomy fusion L5-S1. Condition on discharge: Improved Hospital course: Patient was admitted to undergo surgical decompression arthrodesis at 35 and L4-5 she tolerated this well. Incision is clean and dry. She is discharged home. Medications on discharge: Percocet 5/325 #60 no refills Methocarbamol 500 mg #4o  3 refills

## 2018-01-14 ENCOUNTER — Ambulatory Visit (INDEPENDENT_AMBULATORY_CARE_PROVIDER_SITE_OTHER): Payer: PRIVATE HEALTH INSURANCE | Admitting: Orthopaedic Surgery

## 2018-01-14 ENCOUNTER — Encounter (INDEPENDENT_AMBULATORY_CARE_PROVIDER_SITE_OTHER): Payer: Self-pay | Admitting: Orthopaedic Surgery

## 2018-01-14 ENCOUNTER — Ambulatory Visit (INDEPENDENT_AMBULATORY_CARE_PROVIDER_SITE_OTHER): Payer: PRIVATE HEALTH INSURANCE

## 2018-01-14 DIAGNOSIS — M25552 Pain in left hip: Secondary | ICD-10-CM | POA: Diagnosis not present

## 2018-01-14 DIAGNOSIS — M1612 Unilateral primary osteoarthritis, left hip: Secondary | ICD-10-CM | POA: Diagnosis not present

## 2018-01-14 NOTE — Progress Notes (Signed)
Office Visit Note   Patient: Ellen Sawyer           Date of Birth: 06/24/1956           MRN: [id number] Visit Date: 01/14/2018              Requested by: Barnett Abu, MD [address] [place], [place] [address] PCP: Richmond Campbell., PA-C   Assessment & Plan: Visit Diagnoses:  1. Pain in left hip   2. Unilateral primary osteoarthritis, left hip     Plan: Given the severity of her left hip arthritis we are recommending a total hip replacement.  The x-rays show severe end-stage arthritis and her pain is so severe that it is detrimentally affecting her active daily living, quality of life, mobility.  We went over hip replacement model and went over her x-rays with her in detail.  We had a long and thorough discussion about the risk and benefits of this surgery.  I do feel that it is medically warranted now given the severity of her x-ray findings and her clinical exam.  I would not recommend any intra-articular injection at this point given the complete loss of her joint space.  She understands this as well and due to the fact that her mobility is become so significantly limited and her pain is so bad she would like to proceed with the surgery in the near future.  All questions concerns were answered and addressed.  Follow-Up Instructions: Return for 2 weeks post-op.   Orders:  Orders Placed This Encounter  Procedures  . XR HIP UNILAT W OR W/O PELVIS 1V LEFT   No orders of the defined types were placed in this encounter.     Procedures: No procedures performed   Clinical Data: No additional findings.   Subjective: Chief Complaint  Patient presents with  . Left Hip - Follow-up  The patient is very pleasant and active 62 year old female referred from Dr. Danielle Dess neurosurgeon in town to evaluate known severe arthritis in her left hip.  She had successful lumbar spine surgery 4 months ago by Dr. Danielle Dess and did actually very well after this.  She continued  to have thigh pain and this was worked up and actually found severe arthritis in her left hip.  This is probably normal for a long period time but due to the severity of her lumbar spine issues is likely masked her left hip pain.  The pain is in her groin.  It is 10 out of 10.  It is detrimentally affecting her active daily living, quality of life, and her mobility.  It hurts with pivoting activities like getting in and out of a car.  She is been sitting for long period time she gets up to walk she has severe pain in her left hip and groin area.  HPI  Review of Systems She currently denies any headache, chest pain, shortness of breath, fever, chills, nausea, vomiting.  She is alert and oriented x3 and in no acute distress  Objective: Vital Signs: There were no vitals taken for this visit.  Physical Exam She is alert and oriented x3 and in no acute distress Ortho Exam  Examination of both her hips shows a normal-appearing right hip on exam.  She has fluid and full range of motion of that right hip with no issues at all or pain.  Examination of her left hip shows severe pain with any attempts of internal or external rotation.  She has a lot of guarding with that hip.  She does ambulate with a limp.  Her leg lengths appear normal.  Her knee exam on both sides is normal.  TNo specialty comments available.  Imaging: Xr Hip Unilat W Or W/o Pelvis 1v Left  Result Date: 01/14/2018 An AP pelvis and lateral left hip show severe end-stage arthritis of the left hip.  The right hip appears normal.  Left hip has severe and significant space narrowing.  There is cystic changes in the acetabulum and the femoral head.  There is particular osteophytes and sclerotic changes as well.    PMFS History: Patient Active Problem List   Diagnosis Date Noted  . Unilateral primary osteoarthritis, left hip 01/14/2018  . Spondylolisthesis at L4-L5 level 09/09/2017   Past Medical History:  Diagnosis Date  . Anxiety     DULoxetine (CYMBALTA)  . Asthma   . Chest discomfort    in the past  . Chest pain    in the past  . History of hiatal hernia   . Hypercholesterolemia   . Hypertriglyceridemia   . PONV (postoperative nausea and vomiting)   . Pre-diabetes    year ago was on metformin, then she has been of metforming for many years    Family History  Problem Relation Age of Onset  . Coronary artery disease Father        1970, several bypass  . Dementia Father   . Pancreatic cancer Mother     Past Surgical History:  Procedure Laterality Date  . CARDIAC CATHETERIZATION     2008  . CESAREAN SECTION  1986 and 1989  . COLONOSCOPY    . feet surgery  1990   four  . herniated disc  1990 and 1992   two  . TONSILLECTOMY    . TOOTH EXTRACTION    . WISDOM TOOTH EXTRACTION     Social History   Occupational History  . Not on file  Tobacco Use  . Smoking status: Never Smoker  . Smokeless tobacco: Never Used  Substance and Sexual Activity  . Alcohol use: No  . Drug use: No  . Sexual activity: Not on file

## 2018-01-30 NOTE — Pre-Procedure Instructions (Signed)
Ellen Sawyer  01/30/2018      [id number] [place] - [address] [place] [id number] Phone: [phone] Fax: [phone]    Your procedure is scheduled on March 12  [facility] Admitting at 1228 A.M.  Call this number if you have problems the morning of surgery:  [phone]   Remember:  Do not eat food or drink liquids after midnight.  Continue all medications as directed by your physician except follow these medication instructions before surgery below   Take these medicines the morning of surgery with A SIP OF WATER  budesonide-formoterol (SYMBICORT)  DULoxetine (CYMBALTA) gabapentin (NEURONTIN) HYDROcodone-acetaminophen (NORCO/VICODIN) levothyroxine (SYNTHROID, LEVOTHROID)  7 days prior to surgery STOP taking any Aspirin(unless otherwise instructed by your surgeon), Aleve, Naproxen, Ibuprofen, Motrin, Advil, Goody's, BC's, all herbal medications, fish oil, and all vitamins    Do not wear jewelry, make-up or nail polish.  Do not wear lotions, powders, or perfumes, or deodorant.  Do not shave 48 hours prior to surgery.    Do not bring valuables to the hospital.  [facility] is not responsible for any belongings or valuables.  Contacts, dentures or bridgework may not be worn into surgery.  Leave your suitcase in the car.  After surgery it may be brought to your room.  For patients admitted to the hospital, discharge time will be determined by your treatment team.  Patients discharged the day of surgery will not be allowed to drive home.   Special instructions:   [place]- Preparing For Surgery  Before surgery, you can play an important role. Because skin is not sterile, your skin needs to be as free of germs as possible. You can reduce the number of germs on your skin by washing with CHG (chlorahexidine gluconate) Soap before surgery.  CHG is an antiseptic cleaner which kills germs and bonds  with the skin to continue killing germs even after washing.  Please do not use if you have an allergy to CHG or antibacterial soaps. If your skin becomes reddened/irritated stop using the CHG.  Do not shave (including legs and underarms) for at least 48 hours prior to first CHG shower. It is OK to shave your face.  Please follow these instructions carefully.   1. Shower the NIGHT BEFORE SURGERY and the MORNING OF SURGERY with CHG.   2. If you chose to wash your hair, wash your hair first as usual with your normal shampoo.  3. After you shampoo, rinse your hair and body thoroughly to remove the shampoo.  4. Use CHG as you would any other liquid soap. You can apply CHG directly to the skin and wash gently with a scrungie or a clean washcloth.   5. Apply the CHG Soap to your body ONLY FROM THE NECK DOWN.  Do not use on open wounds or open sores. Avoid contact with your eyes, ears, mouth and genitals (private parts). Wash Face and genitals (private parts)  with your normal soap.  6. Wash thoroughly, paying special attention to the area where your surgery will be performed.  7. Thoroughly rinse your body with warm water from the neck down.  8. DO NOT shower/wash with your normal soap after using and rinsing off the CHG Soap.  9. Pat yourself dry with a CLEAN TOWEL.  10. Wear CLEAN PAJAMAS to bed the night before surgery, wear comfortable clothes the morning of surgery  11. Place CLEAN SHEETS on your  bed the night of your first shower and DO NOT SLEEP WITH PETS.    Day of Surgery: Do not apply any deodorants/lotions. Please wear clean clothes to the hospital/surgery center.      Please read over the following fact sheets that you were given.

## 2018-01-31 ENCOUNTER — Encounter (HOSPITAL_COMMUNITY): Payer: Self-pay

## 2018-01-31 ENCOUNTER — Encounter (HOSPITAL_COMMUNITY)
Admission: RE | Admit: 2018-01-31 | Discharge: 2018-01-31 | Disposition: A | Discharge status: Home / Self Care | Payer: PRIVATE HEALTH INSURANCE | Source: Ambulatory Visit | Attending: Orthopaedic Surgery | Admitting: Orthopaedic Surgery

## 2018-01-31 ENCOUNTER — Other Ambulatory Visit: Payer: Self-pay

## 2018-01-31 DIAGNOSIS — Z01812 Encounter for preprocedural laboratory examination: Secondary | ICD-10-CM | POA: Insufficient documentation

## 2018-01-31 LAB — CBC
HEMATOCRIT: 39.5 % (ref 36.0–46.0)
HEMOGLOBIN: 12.5 g/dL (ref 12.0–15.0)
MCH: 25.6 pg — AB (ref 26.0–34.0)
MCHC: 31.6 g/dL (ref 30.0–36.0)
MCV: 80.8 fL (ref 78.0–100.0)
PLATELETS: 247 10*3/uL (ref 150–400)
RBC: 4.89 MIL/uL (ref 3.87–5.11)
RDW: 17.5 % — ABNORMAL HIGH (ref 11.5–15.5)
WBC: 9.5 10*3/uL (ref 4.0–10.5)

## 2018-01-31 LAB — SURGICAL PCR SCREEN
MRSA, PCR: NEGATIVE
Staphylococcus aureus: NEGATIVE

## 2018-01-31 LAB — BASIC METABOLIC PANEL
ANION GAP: 9 (ref 5–15)
BUN: 16 mg/dL (ref 6–20)
CHLORIDE: 106 mmol/L (ref 101–111)
CO2: 26 mmol/L (ref 22–32)
Calcium: 9.1 mg/dL (ref 8.9–10.3)
Creatinine, Ser: 0.93 mg/dL (ref 0.44–1.00)
GFR calc Af Amer: >60 mL/min (ref >60)
GFR calc non Af Amer: >60 mL/min (ref >60)
GLUCOSE: 114 mg/dL — AB (ref 65–99)
POTASSIUM: 4.1 mmol/L (ref 3.5–5.1)
Sodium: 141 mmol/L (ref 135–145)

## 2018-01-31 NOTE — Progress Notes (Signed)
PCP - Tish MenBreejante Cardiologist - denies but has seen a cardiologist in the past   Chest x-ray - not needed         EKG - 09/04/17 Stress Test - 2010 ECHO - denies Cardiac Cath - around 8 to 10 years ago   instructed to stop aspirin today  Patient denies shortness of breath, fever, cough and chest pain at PAT appointment   Patient verbalized understanding of instructions that were given to them at the PAT appointment. Patient was also instructed that they will need to review over the PAT instructions again at home before surgery.

## 2018-02-03 ENCOUNTER — Other Ambulatory Visit (INDEPENDENT_AMBULATORY_CARE_PROVIDER_SITE_OTHER): Payer: Self-pay | Admitting: Physician Assistant

## 2018-02-10 MED ORDER — TRANEXAMIC ACID 1000 MG/10ML IV SOLN
1000.0000 mg | INTRAVENOUS | Status: AC
  Administered 2018-02-11: 1000 mg via INTRAVENOUS
  Filled 2018-02-10: qty 1100

## 2018-02-11 ENCOUNTER — Encounter (HOSPITAL_COMMUNITY): Payer: Self-pay

## 2018-02-11 ENCOUNTER — Inpatient Hospital Stay (HOSPITAL_COMMUNITY)
Admission: RE | Admit: 2018-02-11 | Discharge: 2018-02-13 | DRG: 470 | Disposition: A | Discharge status: Home Health | Payer: PRIVATE HEALTH INSURANCE | Source: Ambulatory Visit | Attending: Orthopaedic Surgery | Admitting: Orthopaedic Surgery

## 2018-02-11 ENCOUNTER — Inpatient Hospital Stay (HOSPITAL_COMMUNITY): Payer: PRIVATE HEALTH INSURANCE

## 2018-02-11 ENCOUNTER — Other Ambulatory Visit: Payer: Self-pay

## 2018-02-11 ENCOUNTER — Encounter (HOSPITAL_COMMUNITY)
Admission: RE | Disposition: A | Discharge status: Home Health | Payer: Self-pay | Source: Ambulatory Visit | Attending: Orthopaedic Surgery

## 2018-02-11 ENCOUNTER — Inpatient Hospital Stay (HOSPITAL_COMMUNITY): Payer: PRIVATE HEALTH INSURANCE | Admitting: Certified Registered"

## 2018-02-11 DIAGNOSIS — E781 Pure hyperglyceridemia: Secondary | ICD-10-CM | POA: Diagnosis present

## 2018-02-11 DIAGNOSIS — E039 Hypothyroidism, unspecified: Secondary | ICD-10-CM | POA: Diagnosis present

## 2018-02-11 DIAGNOSIS — K219 Gastro-esophageal reflux disease without esophagitis: Secondary | ICD-10-CM | POA: Diagnosis present

## 2018-02-11 DIAGNOSIS — Z7951 Long term (current) use of inhaled steroids: Secondary | ICD-10-CM

## 2018-02-11 DIAGNOSIS — Z888 Allergy status to other drugs, medicaments and biological substances status: Secondary | ICD-10-CM

## 2018-02-11 DIAGNOSIS — R7303 Prediabetes: Secondary | ICD-10-CM | POA: Diagnosis present

## 2018-02-11 DIAGNOSIS — J45909 Unspecified asthma, uncomplicated: Secondary | ICD-10-CM | POA: Diagnosis present

## 2018-02-11 DIAGNOSIS — E7800 Pure hypercholesterolemia, unspecified: Secondary | ICD-10-CM | POA: Diagnosis present

## 2018-02-11 DIAGNOSIS — F419 Anxiety disorder, unspecified: Secondary | ICD-10-CM | POA: Diagnosis present

## 2018-02-11 DIAGNOSIS — Z6837 Body mass index (BMI) 37.0-37.9, adult: Secondary | ICD-10-CM | POA: Diagnosis not present

## 2018-02-11 DIAGNOSIS — Z419 Encounter for procedure for purposes other than remedying health state, unspecified: Secondary | ICD-10-CM

## 2018-02-11 DIAGNOSIS — K449 Diaphragmatic hernia without obstruction or gangrene: Secondary | ICD-10-CM | POA: Diagnosis present

## 2018-02-11 DIAGNOSIS — M1612 Unilateral primary osteoarthritis, left hip: Secondary | ICD-10-CM | POA: Diagnosis not present

## 2018-02-11 DIAGNOSIS — M25552 Pain in left hip: Secondary | ICD-10-CM | POA: Diagnosis present

## 2018-02-11 DIAGNOSIS — D62 Acute posthemorrhagic anemia: Secondary | ICD-10-CM | POA: Diagnosis not present

## 2018-02-11 DIAGNOSIS — Z7982 Long term (current) use of aspirin: Secondary | ICD-10-CM | POA: Diagnosis not present

## 2018-02-11 DIAGNOSIS — E669 Obesity, unspecified: Secondary | ICD-10-CM | POA: Diagnosis present

## 2018-02-11 DIAGNOSIS — Z96642 Presence of left artificial hip joint: Secondary | ICD-10-CM

## 2018-02-11 HISTORY — PX: TOTAL HIP ARTHROPLASTY: SHX124

## 2018-02-11 HISTORY — DX: Unilateral primary osteoarthritis, left hip: M16.12

## 2018-02-11 SURGERY — ARTHROPLASTY, HIP, TOTAL, ANTERIOR APPROACH
Anesthesia: Spinal | Site: Hip | Laterality: Left

## 2018-02-11 MED ORDER — PHENYLEPHRINE 40 MCG/ML (10ML) SYRINGE FOR IV PUSH (FOR BLOOD PRESSURE SUPPORT)
PREFILLED_SYRINGE | INTRAVENOUS | Status: DC | PRN
  Administered 2018-02-11: 80 ug via INTRAVENOUS
  Administered 2018-02-11: 120 ug via INTRAVENOUS

## 2018-02-11 MED ORDER — PHENOL 1.4 % MT LIQD: 1.0000 | OROMUCOSAL | Status: DC | PRN

## 2018-02-11 MED ORDER — PROPOFOL 10 MG/ML IV BOLUS
INTRAVENOUS | Status: DC | PRN
  Administered 2018-02-11 (×3): 20 mg via INTRAVENOUS

## 2018-02-11 MED ORDER — DOCUSATE SODIUM 100 MG PO CAPS
100.0000 mg | ORAL_CAPSULE | Freq: Two times a day (BID) | ORAL | Status: DC
  Administered 2018-02-11 – 2018-02-13 (×4): 100 mg via ORAL
  Filled 2018-02-11 (×4): qty 1

## 2018-02-11 MED ORDER — CLINDAMYCIN PHOSPHATE 900 MG/50ML IV SOLN
INTRAVENOUS | Status: AC
  Filled 2018-02-11: qty 50

## 2018-02-11 MED ORDER — PROPOFOL 1000 MG/100ML IV EMUL
INTRAVENOUS | Status: AC
  Filled 2018-02-11: qty 100

## 2018-02-11 MED ORDER — ONDANSETRON HCL 4 MG/2ML IJ SOLN
INTRAMUSCULAR | Status: DC | PRN
  Administered 2018-02-11: 4 mg via INTRAVENOUS

## 2018-02-11 MED ORDER — OXYCODONE HCL 5 MG PO TABS
10.0000 mg | ORAL_TABLET | ORAL | Status: DC | PRN
  Administered 2018-02-11 – 2018-02-13 (×7): 15 mg via ORAL
  Filled 2018-02-11 (×6): qty 3

## 2018-02-11 MED ORDER — 0.9 % SODIUM CHLORIDE (POUR BTL) OPTIME
TOPICAL | Status: DC | PRN
  Administered 2018-02-11: 200 mL

## 2018-02-11 MED ORDER — METHOCARBAMOL 500 MG PO TABS
ORAL_TABLET | ORAL | Status: AC
  Administered 2018-02-11: 500 mg via ORAL
  Filled 2018-02-11: qty 1

## 2018-02-11 MED ORDER — HYDROMORPHONE HCL 1 MG/ML IJ SOLN
0.5000 mg | INTRAMUSCULAR | Status: DC | PRN
  Administered 2018-02-11: 1 mg via INTRAVENOUS
  Administered 2018-02-12: 0.5 mg via INTRAVENOUS
  Filled 2018-02-11 (×3): qty 1

## 2018-02-11 MED ORDER — GABAPENTIN 300 MG PO CAPS
300.0000 mg | ORAL_CAPSULE | Freq: Three times a day (TID) | ORAL | Status: DC
  Administered 2018-02-11 – 2018-02-13 (×6): 300 mg via ORAL
  Filled 2018-02-11 (×6): qty 1

## 2018-02-11 MED ORDER — MIDAZOLAM HCL 2 MG/2ML IJ SOLN
INTRAMUSCULAR | Status: AC
  Filled 2018-02-11: qty 2

## 2018-02-11 MED ORDER — CHLORHEXIDINE GLUCONATE 4 % EX LIQD: 60.0000 mL | Freq: Once | CUTANEOUS | Status: DC

## 2018-02-11 MED ORDER — ONDANSETRON HCL 4 MG/2ML IJ SOLN
INTRAMUSCULAR | Status: AC
  Filled 2018-02-11: qty 2

## 2018-02-11 MED ORDER — OXYCODONE HCL 5 MG PO TABS
ORAL_TABLET | ORAL | Status: AC
  Administered 2018-02-11: 15 mg via ORAL
  Filled 2018-02-11: qty 3

## 2018-02-11 MED ORDER — OXYCODONE HCL 5 MG PO TABS: 5.0000 mg | ORAL_TABLET | ORAL | Status: DC | PRN

## 2018-02-11 MED ORDER — MOMETASONE FURO-FORMOTEROL FUM 200-5 MCG/ACT IN AERO: 2.0000 | INHALATION_SPRAY | Freq: Two times a day (BID) | RESPIRATORY_TRACT | Status: DC | PRN

## 2018-02-11 MED ORDER — METOCLOPRAMIDE HCL 5 MG/ML IJ SOLN: 5.0000 mg | Freq: Three times a day (TID) | INTRAMUSCULAR | Status: DC | PRN

## 2018-02-11 MED ORDER — HYDROMORPHONE HCL 1 MG/ML IJ SOLN
INTRAMUSCULAR | Status: AC
  Filled 2018-02-11: qty 1

## 2018-02-11 MED ORDER — HYDROMORPHONE HCL 1 MG/ML IJ SOLN
0.2500 mg | INTRAMUSCULAR | Status: DC | PRN
  Administered 2018-02-11: 0.5 mg via INTRAVENOUS

## 2018-02-11 MED ORDER — DEXAMETHASONE SODIUM PHOSPHATE 10 MG/ML IJ SOLN
INTRAMUSCULAR | Status: DC | PRN
  Administered 2018-02-11: 5 mg via INTRAVENOUS

## 2018-02-11 MED ORDER — METHOCARBAMOL 500 MG PO TABS
500.0000 mg | ORAL_TABLET | Freq: Four times a day (QID) | ORAL | Status: DC | PRN
  Administered 2018-02-11 – 2018-02-12 (×4): 500 mg via ORAL
  Filled 2018-02-11 (×3): qty 1

## 2018-02-11 MED ORDER — METHOCARBAMOL 1000 MG/10ML IJ SOLN
500.0000 mg | Freq: Four times a day (QID) | INTRAMUSCULAR | Status: DC | PRN
  Filled 2018-02-11: qty 5

## 2018-02-11 MED ORDER — PANTOPRAZOLE SODIUM 40 MG PO TBEC
40.0000 mg | DELAYED_RELEASE_TABLET | Freq: Every day | ORAL | Status: DC
  Administered 2018-02-12 – 2018-02-13 (×2): 40 mg via ORAL
  Filled 2018-02-11 (×2): qty 1

## 2018-02-11 MED ORDER — B COMPLEX-C PO TABS
1.0000 | ORAL_TABLET | Freq: Every day | ORAL | Status: DC
  Administered 2018-02-12 – 2018-02-13 (×2): 1 via ORAL
  Filled 2018-02-11 (×2): qty 1

## 2018-02-11 MED ORDER — PROMETHAZINE HCL 25 MG/ML IJ SOLN: 6.2500 mg | INTRAMUSCULAR | Status: DC | PRN

## 2018-02-11 MED ORDER — ASPIRIN 81 MG PO CHEW
81.0000 mg | CHEWABLE_TABLET | Freq: Two times a day (BID) | ORAL | Status: DC
  Administered 2018-02-11 – 2018-02-13 (×4): 81 mg via ORAL
  Filled 2018-02-11 (×4): qty 1

## 2018-02-11 MED ORDER — DULOXETINE HCL 60 MG PO CPEP
60.0000 mg | ORAL_CAPSULE | Freq: Every day | ORAL | Status: DC
  Administered 2018-02-12 – 2018-02-13 (×2): 60 mg via ORAL
  Filled 2018-02-11 (×2): qty 1

## 2018-02-11 MED ORDER — ONDANSETRON HCL 4 MG/2ML IJ SOLN
4.0000 mg | Freq: Four times a day (QID) | INTRAMUSCULAR | Status: DC | PRN
Start: 2018-02-11 — End: 2018-02-13

## 2018-02-11 MED ORDER — PHENYLEPHRINE HCL 10 MG/ML IJ SOLN
INTRAMUSCULAR | Status: AC
  Filled 2018-02-11: qty 1

## 2018-02-11 MED ORDER — METOCLOPRAMIDE HCL 5 MG PO TABS: 5.0000 mg | ORAL_TABLET | Freq: Three times a day (TID) | ORAL | Status: DC | PRN

## 2018-02-11 MED ORDER — SODIUM CHLORIDE 0.9 % IR SOLN
Status: DC | PRN
  Administered 2018-02-11: 3000 mL

## 2018-02-11 MED ORDER — MEPERIDINE HCL 50 MG/ML IJ SOLN: 6.2500 mg | INTRAMUSCULAR | Status: DC | PRN

## 2018-02-11 MED ORDER — PHENYLEPHRINE HCL 10 MG/ML IJ SOLN
INTRAVENOUS | Status: DC | PRN
  Administered 2018-02-11: 40 ug/min via INTRAVENOUS

## 2018-02-11 MED ORDER — B COMPLEX PO TABS: 1.0000 | ORAL_TABLET | Freq: Every day | ORAL | Status: DC

## 2018-02-11 MED ORDER — ALUM & MAG HYDROXIDE-SIMETH 200-200-20 MG/5ML PO SUSP: 30.0000 mL | ORAL | Status: DC | PRN

## 2018-02-11 MED ORDER — PROPOFOL 500 MG/50ML IV EMUL
INTRAVENOUS | Status: DC | PRN
  Administered 2018-02-11: 25 ug/kg/min via INTRAVENOUS

## 2018-02-11 MED ORDER — CLINDAMYCIN PHOSPHATE 900 MG/50ML IV SOLN
900.0000 mg | INTRAVENOUS | Status: AC
  Administered 2018-02-11: 900 mg via INTRAVENOUS

## 2018-02-11 MED ORDER — SODIUM CHLORIDE 0.9 % IV SOLN
INTRAVENOUS | Status: DC
  Administered 2018-02-11: 21:00:00 via INTRAVENOUS

## 2018-02-11 MED ORDER — ONDANSETRON HCL 4 MG PO TABS: 4.0000 mg | ORAL_TABLET | Freq: Four times a day (QID) | ORAL | Status: DC | PRN

## 2018-02-11 MED ORDER — FENTANYL CITRATE (PF) 250 MCG/5ML IJ SOLN
INTRAMUSCULAR | Status: AC
  Filled 2018-02-11: qty 5

## 2018-02-11 MED ORDER — ACETAMINOPHEN 325 MG PO TABS
325.0000 mg | ORAL_TABLET | Freq: Four times a day (QID) | ORAL | Status: DC | PRN
  Administered 2018-02-13: 650 mg via ORAL
  Filled 2018-02-11: qty 2

## 2018-02-11 MED ORDER — FENTANYL CITRATE (PF) 100 MCG/2ML IJ SOLN
INTRAMUSCULAR | Status: DC | PRN
  Administered 2018-02-11: 50 ug via INTRAVENOUS

## 2018-02-11 MED ORDER — DIPHENHYDRAMINE HCL 12.5 MG/5ML PO ELIX: 12.5000 mg | ORAL_SOLUTION | ORAL | Status: DC | PRN

## 2018-02-11 MED ORDER — LACTATED RINGERS IV SOLN
INTRAVENOUS | Status: DC
  Administered 2018-02-11 (×2): via INTRAVENOUS

## 2018-02-11 MED ORDER — LEVOTHYROXINE SODIUM 50 MCG PO TABS
50.0000 ug | ORAL_TABLET | Freq: Every day | ORAL | Status: DC
  Administered 2018-02-12 – 2018-02-13 (×2): 50 ug via ORAL
  Filled 2018-02-11 (×2): qty 1

## 2018-02-11 MED ORDER — MENTHOL 3 MG MT LOZG: 1.0000 | LOZENGE | OROMUCOSAL | Status: DC | PRN

## 2018-02-11 MED ORDER — CLINDAMYCIN PHOSPHATE 600 MG/50ML IV SOLN
600.0000 mg | Freq: Four times a day (QID) | INTRAVENOUS | Status: AC
  Administered 2018-02-11 – 2018-02-12 (×2): 600 mg via INTRAVENOUS
  Filled 2018-02-11 (×2): qty 50

## 2018-02-11 MED ORDER — DEXAMETHASONE SODIUM PHOSPHATE 10 MG/ML IJ SOLN
INTRAMUSCULAR | Status: AC
  Filled 2018-02-11: qty 1

## 2018-02-11 MED ORDER — PHENYLEPHRINE 40 MCG/ML (10ML) SYRINGE FOR IV PUSH (FOR BLOOD PRESSURE SUPPORT)
PREFILLED_SYRINGE | INTRAVENOUS | Status: AC
  Filled 2018-02-11: qty 10

## 2018-02-11 MED ORDER — MIDAZOLAM HCL 5 MG/5ML IJ SOLN
INTRAMUSCULAR | Status: DC | PRN
  Administered 2018-02-11: 2 mg via INTRAVENOUS

## 2018-02-11 SURGICAL SUPPLY — 56 items
BENZOIN TINCTURE PRP APPL 2/3 (GAUZE/BANDAGES/DRESSINGS) ×3 IMPLANT
BLADE CLIPPER SURG (BLADE) IMPLANT
BLADE SAW SGTL 18X1.27X75 (BLADE) ×2 IMPLANT
BLADE SAW SGTL 18X1.27X75MM (BLADE) ×1
CAPT HIP TOTAL 2 ×3 IMPLANT
CELLS DAT CNTRL 66122 CELL SVR (MISCELLANEOUS) ×1 IMPLANT
CLOSURE WOUND 1/2 X4 (GAUZE/BANDAGES/DRESSINGS) ×2
COVER SURGICAL LIGHT HANDLE (MISCELLANEOUS) ×3 IMPLANT
CUP SECTOR GRIPTON 50MM (Cup) IMPLANT
DRAPE C-ARM 42X72 X-RAY (DRAPES) ×3 IMPLANT
DRAPE STERI IOBAN 125X83 (DRAPES) ×3 IMPLANT
DRAPE U-SHAPE 47X51 STRL (DRAPES) ×9 IMPLANT
DRESSING AQUACEL AG SP 3.5X10 (GAUZE/BANDAGES/DRESSINGS) ×1 IMPLANT
DRSG AQUACEL AG ADV 3.5X10 (GAUZE/BANDAGES/DRESSINGS) ×3 IMPLANT
DRSG AQUACEL AG SP 3.5X10 (GAUZE/BANDAGES/DRESSINGS) ×3
DURAPREP 26ML APPLICATOR (WOUND CARE) ×3 IMPLANT
ELECT BLADE 4.0 EZ CLEAN MEGAD (MISCELLANEOUS) ×3
ELECT BLADE 6.5 EXT (BLADE) ×3 IMPLANT
ELECT REM PT RETURN 9FT ADLT (ELECTROSURGICAL) ×3
ELECTRODE BLDE 4.0 EZ CLN MEGD (MISCELLANEOUS) ×1 IMPLANT
ELECTRODE REM PT RTRN 9FT ADLT (ELECTROSURGICAL) ×1 IMPLANT
FACESHIELD WRAPAROUND (MASK) ×6 IMPLANT
GAUZE XEROFORM 1X8 LF (GAUZE/BANDAGES/DRESSINGS) ×3 IMPLANT
GLOVE BIOGEL PI IND STRL 8 (GLOVE) ×2 IMPLANT
GLOVE BIOGEL PI INDICATOR 8 (GLOVE) ×4
GLOVE ECLIPSE 8.0 STRL XLNG CF (GLOVE) ×3 IMPLANT
GLOVE ORTHO TXT STRL SZ7.5 (GLOVE) ×6 IMPLANT
GOWN STRL REUS W/ TWL LRG LVL3 (GOWN DISPOSABLE) ×2 IMPLANT
GOWN STRL REUS W/ TWL XL LVL3 (GOWN DISPOSABLE) ×2 IMPLANT
GOWN STRL REUS W/TWL LRG LVL3 (GOWN DISPOSABLE) ×4
GOWN STRL REUS W/TWL XL LVL3 (GOWN DISPOSABLE) ×4
HANDPIECE INTERPULSE COAX TIP (DISPOSABLE) ×2
KIT BASIN OR (CUSTOM PROCEDURE TRAY) ×3 IMPLANT
KIT ROOM TURNOVER OR (KITS) ×3 IMPLANT
MANIFOLD NEPTUNE II (INSTRUMENTS) ×3 IMPLANT
NS IRRIG 1000ML POUR BTL (IV SOLUTION) ×3 IMPLANT
PACK TOTAL JOINT (CUSTOM PROCEDURE TRAY) ×3 IMPLANT
PAD ARMBOARD 7.5X6 YLW CONV (MISCELLANEOUS) ×3 IMPLANT
RTRCTR WOUND ALEXIS 18CM MED (MISCELLANEOUS) ×3
SET HNDPC FAN SPRY TIP SCT (DISPOSABLE) ×1 IMPLANT
SPONGE LAP 18X18 X RAY DECT (DISPOSABLE) ×3 IMPLANT
STAPLER VISISTAT 35W (STAPLE) IMPLANT
STRIP CLOSURE SKIN 1/2X4 (GAUZE/BANDAGES/DRESSINGS) ×4 IMPLANT
SUT ETHIBOND NAB CT1 #1 30IN (SUTURE) ×3 IMPLANT
SUT MNCRL AB 4-0 PS2 18 (SUTURE) IMPLANT
SUT VIC AB 0 CT1 27 (SUTURE) ×2
SUT VIC AB 0 CT1 27XBRD ANBCTR (SUTURE) ×1 IMPLANT
SUT VIC AB 1 CT1 27 (SUTURE) ×4
SUT VIC AB 1 CT1 27XBRD ANBCTR (SUTURE) ×2 IMPLANT
SUT VIC AB 2-0 CT1 27 (SUTURE) ×2
SUT VIC AB 2-0 CT1 TAPERPNT 27 (SUTURE) ×1 IMPLANT
TOWEL OR 17X24 6PK STRL BLUE (TOWEL DISPOSABLE) ×3 IMPLANT
TOWEL OR 17X26 10 PK STRL BLUE (TOWEL DISPOSABLE) ×3 IMPLANT
TRAY CATH 16FR W/PLASTIC CATH (SET/KITS/TRAYS/PACK) IMPLANT
TRAY FOLEY W/METER SILVER 16FR (SET/KITS/TRAYS/PACK) IMPLANT
WATER STERILE IRR 1000ML POUR (IV SOLUTION) ×6 IMPLANT

## 2018-02-11 NOTE — Brief Op Note (Signed)
02/11/2018  4:14 PM  PATIENT:  Ellen Sawyer  62 y.o. female  PRE-OPERATIVE DIAGNOSIS:  osteoarthritis left hip  POST-OPERATIVE DIAGNOSIS:  osteoarthritis left hip  PROCEDURE:  Procedure(s): LEFT TOTAL HIP ARTHROPLASTY ANTERIOR APPROACH (Left)  SURGEON:  Surgeon(s) and Role:    Kathryne Hitch* Machael Raine Y, MD - Primary  PHYSICIAN ASSISTANT: Rexene EdisonGil Clark, PA-C  ANESTHESIA:   spinal  EBL:  500 mL   COUNTS:  YES  DICTATION: .Other Dictation: Dictation Number [id number]  PLAN OF CARE: Admit to inpatient   PATIENT DISPOSITION:  PACU - hemodynamically stable.   Delay start of Pharmacological VTE agent (>24hrs) due to surgical blood loss or risk of bleeding: no

## 2018-02-11 NOTE — Transfer of Care (Signed)
Immediate Anesthesia Transfer of Care Note  Patient: Ellen Sawyer  Procedure(s) Performed: LEFT TOTAL HIP ARTHROPLASTY ANTERIOR APPROACH (Left Hip)  Patient Location: PACU  Anesthesia Type:MAC combined with regional for post-op pain spinal  Level of Consciousness: awake, alert  and oriented  Airway & Oxygen Therapy: Patient Spontanous Breathing and Patient connected to nasal cannula oxygen  Post-op Assessment: Report given to RN and Post -op Vital signs reviewed and stable  Post vital signs: Reviewed and stable  Last Vitals:  Vitals:   02/11/18 1220 02/11/18 1655  BP: (!) 149/79   Pulse: 80   Resp: 18   Temp: 36.6 C (P) 36.6 C  SpO2: 97%     Last Pain:  Vitals:   02/11/18 1655  TempSrc:   PainSc: (P) 0-No pain      Patients Stated Pain Goal: 3 (38/18/29 9371)  Complications: No apparent anesthesia complications

## 2018-02-11 NOTE — Anesthesia Procedure Notes (Signed)
Spinal  Patient location during procedure: OR Staffing Anesthesiologist: Darnise Montag, MD Performed: anesthesiologist  Preanesthetic Checklist Completed: patient identified, site marked, surgical consent, pre-op evaluation, timeout performed, IV checked, risks and benefits discussed and monitors and equipment checked Spinal Block Patient position: sitting Prep: ChloraPrep Patient monitoring: heart rate, continuous pulse ox and blood pressure Approach: right paramedian Location: L2-3 Injection technique: single-shot Needle Needle type: Sprotte  Needle gauge: 24 G Needle length: 9 cm Additional Notes Expiration date of kit checked and confirmed. Patient tolerated procedure well, without complications.       

## 2018-02-11 NOTE — H&P (Signed)
TOTAL HIP ADMISSION H&P  Patient is admitted for left total hip arthroplasty.  Subjective:  Chief Complaint: left hip pain  HPI: Ellen Sawyer, 62 y.o. female, has a history of pain and functional disability in the left hip(s) due to arthritis and patient has failed non-surgical conservative treatments for greater than 12 weeks to include NSAID's and/or analgesics, use of assistive devices and activity modification.  Onset of symptoms was abrupt starting 1 years ago with rapidlly worsening course since that time.The patient noted no past surgery on the left hip(s).  Patient currently rates pain in the left hip at 10 out of 10 with activity. Patient has night pain, worsening of pain with activity and weight bearing, trendelenberg gait, pain that interfers with activities of daily living and pain with passive range of motion. Patient has evidence of subchondral cysts, subchondral sclerosis, periarticular osteophytes and joint space narrowing by imaging studies. This condition presents safety issues increasing the risk of falls.  There is no current active infection.  Patient Active Problem List   Diagnosis Date Noted  . Unilateral primary osteoarthritis, left hip 01/14/2018  . Spondylolisthesis at L4-L5 level 09/09/2017   Past Medical History:  Diagnosis Date  . Anxiety    DULoxetine (CYMBALTA)  . Asthma   . Chest discomfort    in the past  . Chest pain    in the past  . History of hiatal hernia   . Hypercholesterolemia   . Hypertriglyceridemia   . PONV (postoperative nausea and vomiting)   . Pre-diabetes    year ago was on metformin, then she has been of metforming for many years    Past Surgical History:  Procedure Laterality Date  . CARDIAC CATHETERIZATION     2008  . CESAREAN SECTION  1986 and 1989  . COLONOSCOPY    . feet surgery  1990   four  . herniated disc  1990 and 1992   two  . LUMBAR LAMINECTOMY     rods screws, cadiver bone   . TONSILLECTOMY    . TOOTH  EXTRACTION    . WISDOM TOOTH EXTRACTION      Current Facility-Administered Medications  Medication Dose Route Frequency Provider Last Rate Last Dose  . tranexamic acid (CYKLOKAPRON) 1,000 mg in sodium chloride 0.9 % 100 mL IVPB  1,000 mg Intravenous To OR Kathryne HitchBlackman, Francene Mcerlean Y, MD       Current Outpatient Medications  Medication Sig Dispense Refill Last Dose  . aspirin EC 81 MG tablet Take 81 mg by mouth daily.   Taking  . b complex vitamins tablet Take 1 tablet by mouth daily.   Taking  . budesonide-formoterol (SYMBICORT) 160-4.5 MCG/ACT inhaler Inhale 2 puffs into the lungs 2 (two) times daily as needed.   Taking  . DULoxetine (CYMBALTA) 60 MG capsule Take 60 mg by mouth daily.   Taking  . HYDROcodone-acetaminophen (NORCO/VICODIN) 5-325 MG tablet 1 tablet every night at bedtime   Taking  . levothyroxine (SYNTHROID, LEVOTHROID) 50 MCG tablet Take 50 mcg by mouth daily before breakfast.    Taking  . Vitamin D, Ergocalciferol, (DRISDOL) 50000 units CAPS capsule Take 50,000 Units by mouth every Saturday. Saturdays   Taking  . gabapentin (NEURONTIN) 300 MG capsule Take 300 mg by mouth 3 (three) times daily.   Taking  . methocarbamol (ROBAXIN) 500 MG tablet Take 1 tablet (500 mg total) by mouth every 6 (six) hours as needed for muscle spasms. (Patient not taking: Reported on 01/14/2018) 40 tablet 3  Not Taking  . oxyCODONE-acetaminophen (PERCOCET/ROXICET) 5-325 MG tablet Take 1-2 tablets by mouth every 4 (four) hours as needed for moderate pain. (Patient not taking: Reported on 01/14/2018) 60 tablet 0 Not Taking   Allergies  Allergen Reactions  . Ceftin [Cefuroxime Axetil] Anaphylaxis and Other (See Comments)    Other  . Cinobac [Cinoxacin] Anaphylaxis    Social History   Tobacco Use  . Smoking status: Never Smoker  . Smokeless tobacco: Never Used  Substance Use Topics  . Alcohol use: No    Family History  Problem Relation Age of Onset  . Coronary artery disease Father        1970,  several bypass  . Dementia Father   . Pancreatic cancer Mother      Review of Systems  Musculoskeletal: Positive for joint pain.  All other systems reviewed and are negative.   Objective:  Physical Exam  Constitutional: She is oriented to person, place, and time. She appears well-developed and well-nourished.  HENT:  Head: Normocephalic and atraumatic.  Eyes: EOM are normal. Pupils are equal, round, and reactive to light.  Neck: Normal range of motion. Neck supple.  Cardiovascular: Normal rate and regular rhythm.  Respiratory: Effort normal and breath sounds normal.  GI: Soft. Bowel sounds are normal.  Musculoskeletal:       Left hip: She exhibits decreased range of motion, decreased strength, tenderness and bony tenderness.  Neurological: She is alert and oriented to person, place, and time.  Skin: Skin is warm and dry.  Psychiatric: She has a normal mood and affect.    Vital signs in last 24 hours:    Labs:   Estimated body mass index is 31.69 kg/m (pended) as calculated from the following:   Height as of 01/31/18: (P) 5' 4.5" (1.638 m).   Weight as of 01/31/18: (P) 187 lb 8 oz (85 kg).   Imaging Review Plain radiographs demonstrate severe degenerative joint disease of the left hip(s). The bone quality appears to be good for age and reported activity level.  Assessment/Plan:  End stage arthritis, left hip(s)  The patient history, physical examination, clinical judgement of the provider and imaging studies are consistent with end stage degenerative joint disease of the left hip(s) and total hip arthroplasty is deemed medically necessary. The treatment options including medical management, injection therapy, arthroscopy and arthroplasty were discussed at length. The risks and benefits of total hip arthroplasty were presented and reviewed. The risks due to aseptic loosening, infection, stiffness, dislocation/subluxation,  thromboembolic complications and other imponderables  were discussed.  The patient acknowledged the explanation, agreed to proceed with the plan and consent was signed. Patient is being admitted for inpatient treatment for surgery, pain control, PT, OT, prophylactic antibiotics, VTE prophylaxis, progressive ambulation and ADL's and discharge planning.The patient is planning to be discharged home with home health services

## 2018-02-11 NOTE — Anesthesia Preprocedure Evaluation (Signed)
Anesthesia Evaluation  Patient identified by MRN, date of birth, ID band Patient awake    Reviewed: Allergy & Precautions, NPO status , Patient's Chart, lab work & pertinent test results  History of Anesthesia Complications (+) PONV and history of anesthetic complications  Airway Mallampati: I  TM Distance: >3 FB     Dental  (+) Teeth Intact, Dental Advisory Given   Pulmonary asthma ,    Pulmonary exam normal breath sounds clear to auscultation       Cardiovascular Normal cardiovascular exam Rhythm:Regular Rate:Normal     Neuro/Psych PSYCHIATRIC DISORDERS Anxiety negative neurological ROS     GI/Hepatic hiatal hernia, GERD  Controlled,  Endo/Other  Hypothyroidism   Renal/GU      Musculoskeletal  (+) Arthritis , Osteoarthritis,    Abdominal (+) + obese,   Peds  Hematology   Anesthesia Other Findings   Reproductive/Obstetrics                             Anesthesia Physical  Anesthesia Plan  ASA: II  Anesthesia Plan: Spinal   Post-op Pain Management:    Induction: Intravenous  PONV Risk Score and Plan: 4 or greater and Ondansetron, Dexamethasone, Midazolam and Treatment may vary due to age or medical condition  Airway Management Planned:   Additional Equipment:   Intra-op Plan:   Post-operative Plan:   Informed Consent: I have reviewed the patients History and Physical, chart, labs and discussed the procedure including the risks, benefits and alternatives for the proposed anesthesia with the patient or authorized representative who has indicated his/her understanding and acceptance.   Dental advisory given  Plan Discussed with: CRNA  Anesthesia Plan Comments:         Anesthesia Quick Evaluation

## 2018-02-11 NOTE — Progress Notes (Signed)
Orthopedic Tech Progress Note Patient Details:  [name] 07/10/1956 [id number]  Ortho Devices Ortho Device/Splint Location: Over head frame applied to pt bed.  pt instructed on how to use Overhead frame.  Ortho Device/Splint Interventions: Application   Post Interventions Instructions Provided: Care of device   Alvina ChouWilliams, Demorris Choyce C 02/11/2018, 8:37 PM

## 2018-02-12 ENCOUNTER — Other Ambulatory Visit: Payer: Self-pay

## 2018-02-12 ENCOUNTER — Encounter (HOSPITAL_COMMUNITY): Payer: Self-pay | Admitting: General Practice

## 2018-02-12 LAB — BASIC METABOLIC PANEL
ANION GAP: 10 (ref 5–15)
BUN: 19 mg/dL (ref 6–20)
CO2: 24 mmol/L (ref 22–32)
Calcium: 8.6 mg/dL — ABNORMAL LOW (ref 8.9–10.3)
Chloride: 104 mmol/L (ref 101–111)
Creatinine, Ser: 1.02 mg/dL — ABNORMAL HIGH (ref 0.44–1.00)
GFR, EST NON AFRICAN AMERICAN: 59 mL/min — AB (ref >60)
Glucose, Bld: 135 mg/dL — ABNORMAL HIGH (ref 65–99)
POTASSIUM: 4.4 mmol/L (ref 3.5–5.1)
SODIUM: 138 mmol/L (ref 135–145)

## 2018-02-12 LAB — CBC
HCT: 31.7 % — ABNORMAL LOW (ref 36.0–46.0)
Hemoglobin: 10 g/dL — ABNORMAL LOW (ref 12.0–15.0)
MCH: 26 pg (ref 26.0–34.0)
MCHC: 31.5 g/dL (ref 30.0–36.0)
MCV: 82.6 fL (ref 78.0–100.0)
Platelets: 233 10*3/uL (ref 150–400)
RBC: 3.84 MIL/uL — AB (ref 3.87–5.11)
RDW: 18.1 % — ABNORMAL HIGH (ref 11.5–15.5)
WBC: 10.6 10*3/uL — AB (ref 4.0–10.5)

## 2018-02-12 NOTE — Progress Notes (Signed)
Subjective: 1 Day Post-Op Procedure(s) (LRB): LEFT TOTAL HIP ARTHROPLASTY ANTERIOR APPROACH (Left) Patient reports pain as moderate.  Acute blood loss anemia from surgery, but tolerating well.  Objective: Vital signs in last 24 hours: Temp:  [97.7 F (36.5 C)-98.2 F (36.8 C)] 98.2 F (36.8 C) (03/13 0503) Pulse Rate:  [64-91] 84 (03/13 0503) Resp:  [12-26] 16 (03/13 0503) BP: (101-149)/(58-98) 101/79 (03/13 0503) SpO2:  [90 %-100 %] 100 % (03/13 0503) Weight:  [187 lb (84.8 kg)-192 lb 7.4 oz (87.3 kg)] 192 lb 7.4 oz (87.3 kg) (03/12 1955)  Intake/Output from previous day: 03/12 0701 - 03/13 0700 In: 1795 [P.O.:120; I.V.:1675] Out: 780 [Urine:280; Blood:500] Intake/Output this shift: No intake/output data recorded.  Recent Labs    02/12/18 0439  HGB 10.0*   Recent Labs    02/12/18 0439  WBC 10.6*  RBC 3.84*  HCT 31.7*  PLT 233   Recent Labs    02/12/18 0439  NA 138  K 4.4  CL 104  CO2 24  BUN 19  CREATININE 1.02*  GLUCOSE 135*  CALCIUM 8.6*   No results for input(s): LABPT, INR in the last 72 hours.  Sensation intact distally Intact pulses distally Dorsiflexion/Plantar flexion intact Incision: dressing C/D/I  Assessment/Plan: 1 Day Post-Op Procedure(s) (LRB): LEFT TOTAL HIP ARTHROPLASTY ANTERIOR APPROACH (Left) Up with therapy  Kathryne HitchChristopher Y Baby Gieger 02/12/2018, 7:08 AM

## 2018-02-12 NOTE — Care Management Note (Signed)
Case Management Note  Patient Details  Name: Ellen Sawyer MRN: [id number] Date of Birth: 1956-01-30  Subjective/Objective:                    Action/Plan:  Patient had back surgery last fall and already has 3 in 1 and walker at home. Expected Discharge Date:                  Expected Discharge Plan:  Home w Home Health Services  In-House Referral:     Discharge planning Services  CM Consult  Post Acute Care Choice:  Durable Medical Equipment, Home Health Choice offered to:  Patient  DME Arranged:  N/A DME Agency:  NA  HH Arranged:  NA HH Agency:  NA  Status of Service:  Completed, signed off  If discussed at Long Length of Stay Meetings, dates discussed:    Additional Comments:  Kingsley PlanWile, Ellen Bellot Marie, RN 02/12/2018, 12:54 PM

## 2018-02-12 NOTE — Evaluation (Signed)
Physical Therapy Evaluation Patient Details Name: Ellen Sawyer H O'Sullivan MRN: [id number] DOB: Sep 15, 1956 Today's Date: 02/12/2018   History of Present Illness  Pt. is a 62 y.o. female who recently underwent a L THA. Pt. PMH includes anxiety, ashtma, chest pain and pre-diabetes. Previous surgical history includes a lumbar laminectomy, four feet surgeries, and two herniated discs.    Clinical Impression  Patient is s/p above surgery resulting in functional limitations due to the deficits listed below (see PT Problem List). PTA, patient living at home with husband with 24 hour support and stairs to enter home, ambulating with RW due to pain. Upon eval pt presents with post op pain and weakness that limit mobility. Ambulating slowly with min guard at this time. Next session will progress activity and therex, plan focus on stair training tomorrow.  Patient will benefit from skilled PT to increase their independence and safety with mobility to allow discharge to the venue listed below.       Follow Up Recommendations Follow surgeon's recommendation for DC plan and follow-up therapies;Supervision/Assistance - 24 hour    Equipment Recommendations  None recommended by PT    Recommendations for Other Services       Precautions / Restrictions Precautions Precautions: None Precaution Comments: Direct anterior hip, no precautions Restrictions Weight Bearing Restrictions: Yes LLE Weight Bearing: Weight bearing as tolerated      Mobility  Bed Mobility Overal bed mobility: Needs Assistance Bed Mobility: Supine to Sit     Supine to sit: Mod assist     General bed mobility comments: in chair at entry  Transfers Overall transfer level: Needs assistance Equipment used: Rolling walker (2 wheeled) Transfers: Sit to/from Stand Sit to Stand: Min assist         General transfer comment: Pt. required cueing for hand placement on walker during sit-stand transfer. Assist to boost up from chair and  for steadying in standing  Ambulation/Gait Ambulation/Gait assistance: Min guard Ambulation Distance (Feet): 150 Feet Assistive device: Rolling walker (2 wheeled) Gait Pattern/deviations: Step-to pattern;Step-through pattern Gait velocity: decreased   General Gait Details: min guard for safety, cues for step length and heel strike, patient able to progress mechanics   Stairs            Wheelchair Mobility    Modified Rankin (Stroke Patients Only)       Balance Overall balance assessment: Needs assistance Sitting-balance support: Bilateral upper extremity supported Sitting balance-Leahy Scale: Fair     Standing balance support: During functional activity;Bilateral upper extremity supported Standing balance-Leahy Scale: Poor Standing balance comment: RW support due to pain                             Pertinent Vitals/Pain Pain Assessment: Faces Faces Pain Scale: Hurts even more Pain Location: L hip Pain Descriptors / Indicators: Discomfort;Grimacing;Sharp;Spasm;Moaning Pain Intervention(s): Limited activity within patient's tolerance;Monitored during session;Premedicated before session;Repositioned    Home Living Family/patient expects to be discharged to:: Private residence Living Arrangements: Spouse/significant other Available Help at Discharge: Family;Available 24 hours/day Type of Home: House Home Access: Stairs to enter Entrance Stairs-Rails: None Entrance Stairs-Number of Steps: 4-5 Home Layout: One level Home Equipment: Walker - 2 wheels;Cane - single point;Bedside commode      Prior Function Level of Independence: Independent         Comments: Pt works full time at a doctor's office. Was active at home (cutting grass, cooking, cleaning) and was driving PTA. Pt reports  no use of AD PTA but reports multiple falls.      Hand Dominance        Extremity/Trunk Assessment   Upper Extremity Assessment Upper Extremity Assessment:  Overall WFL for tasks assessed    Lower Extremity Assessment Lower Extremity Assessment: LLE pain and weakness consistent with above procedure, RLE strength WNL    Cervical / Trunk Assessment Cervical / Trunk Assessment: Normal  Communication   Communication: No difficulties  Cognition Arousal/Alertness: Awake/alert Behavior During Therapy: WFL for tasks assessed/performed Overall Cognitive Status: Within Functional Limits for tasks assessed                                        General Comments      Exercises Total Joint Exercises Ankle Circles/Pumps: 20 reps Quad Sets: 10 reps Hip ABduction/ADduction: 10 reps   Assessment/Plan    PT Assessment    PT Problem List         PT Treatment Interventions      PT Goals (Current goals can be found in the Care Plan section)  Acute Rehab PT Goals Patient Stated Goal: decrease pain PT Goal Formulation: With patient Time For Goal Achievement: 02/19/18 Potential to Achieve Goals: Good    Frequency 7X/week   Barriers to discharge        Co-evaluation               AM-PAC PT "6 Clicks" Daily Activity  Outcome Measure Difficulty turning over in bed (including adjusting bedclothes, sheets and blankets)?: A Lot Difficulty moving from lying on back to sitting on the side of the bed? : A Lot Difficulty sitting down on and standing up from a chair with arms (e.g., wheelchair, bedside commode, etc,.)?: A Lot Help needed moving to and from a bed to chair (including a wheelchair)?: A Little Help needed walking in hospital room?: A Little Help needed climbing 3-5 steps with a railing? : A Lot 6 Click Score: 14    End of Session Equipment Utilized During Treatment: Gait belt Activity Tolerance: Patient tolerated treatment well Patient left: in chair;with call bell/phone within reach Nurse Communication: Mobility status PT Visit Diagnosis: Unsteadiness on feet (R26.81);Other abnormalities of gait and  mobility (R26.89);Muscle weakness (generalized) (M62.81);Pain Pain - Right/Left: Left Pain - part of body: Hip    Time: 1114-1140 PT Time Calculation (min) (ACUTE ONLY): 26 min   Charges:   PT Evaluation $PT Eval Low Complexity: 1 Low PT Treatments $Gait Training: 8-22 mins   PT G Codes:        Etta Grandchild, PT, DPT Acute Rehab Services Pager: [phone]   Etta Grandchild 02/12/2018, 11:51 AM

## 2018-02-12 NOTE — Progress Notes (Signed)
Physical Therapy Treatment Patient Details Name: Ellen Sawyer MRN: [id number] DOB: 08-Jun-1956 Today's Date: 02/12/2018    History of Present Illness Pt. is a 62 y.o. female who recently underwent a L THA. Pt. PMH includes anxiety, ashtma, chest pain and pre-diabetes. Previous surgical history includes a lumbar laminectomy, four feet surgeries, and two herniated discs.    PT Comments    Patient progressing well with therapy, now ambulating further distances with increased independence and improving mechanics. Progressed therex this session and discussed with pt and husband plan to focus on stairs tomorrow. Anticipate needing 2 sessions for stair training tomorrow.    Follow Up Recommendations  Follow surgeon's recommendation for DC plan and follow-up therapies;Supervision/Assistance - 24 hour     Equipment Recommendations  None recommended by PT    Recommendations for Other Services       Precautions / Restrictions Precautions Precautions: None Precaution Comments: Direct anterior hip, no precautions Restrictions Weight Bearing Restrictions: Yes LLE Weight Bearing: Weight bearing as tolerated    Mobility  Bed Mobility Overal bed mobility: Needs Assistance Bed Mobility: Supine to Sit     Supine to sit: Mod assist     General bed mobility comments: Mod A with surgical limb over EOB, cued for sheet, patient unable to perform at thsi time.  Transfers Overall transfer level: Needs assistance Equipment used: Rolling walker (2 wheeled) Transfers: Sit to/from Stand Sit to Stand: Min guard         General transfer comment: min guard at this time  Ambulation/Gait Ambulation/Gait assistance: Supervision Ambulation Distance (Feet): 270 Feet Assistive device: Rolling walker (2 wheeled) Gait Pattern/deviations: Step-to pattern;Step-through pattern Gait velocity: decreased   General Gait Details: progressed to supervision. patient with more consistent step through  pattern, increased velocity.   Stairs            Wheelchair Mobility    Modified Rankin (Stroke Patients Only)       Balance Overall balance assessment: Needs assistance Sitting-balance support: Bilateral upper extremity supported Sitting balance-Leahy Scale: Fair     Standing balance support: During functional activity;Bilateral upper extremity supported Standing balance-Leahy Scale: Poor Standing balance comment: RW support due to pain                            Cognition Arousal/Alertness: Awake/alert Behavior During Therapy: WFL for tasks assessed/performed Overall Cognitive Status: Within Functional Limits for tasks assessed                                        Exercises Total Joint Exercises Ankle Circles/Pumps: 20 reps Quad Sets: 10 reps Hip ABduction/ADduction: 10 reps Knee Flexion: 10 reps Marching in Standing: 10 reps Standing Hip Extension: 10 reps    General Comments        Pertinent Vitals/Pain Pain Assessment: Faces Faces Pain Scale: Hurts even more Pain Location: L hip Pain Descriptors / Indicators: Discomfort;Grimacing;Sharp;Spasm;Moaning Pain Intervention(s): Limited activity within patient's tolerance;Monitored during session;Premedicated before session;Repositioned    Home Living Family/patient expects to be discharged to:: Private residence Living Arrangements: Spouse/significant other                  Prior Function            PT Goals (current goals can now be found in the care plan section) Acute Rehab PT Goals Patient Stated Goal:  decrease pain PT Goal Formulation: With patient Time For Goal Achievement: 02/19/18 Potential to Achieve Goals: Good    Frequency    7X/week      PT Plan Current plan remains appropriate    Co-evaluation              AM-PAC PT "6 Clicks" Daily Activity  Outcome Measure  Difficulty turning over in bed (including adjusting bedclothes, sheets  and blankets)?: A Lot Difficulty moving from lying on back to sitting on the side of the bed? : A Lot Difficulty sitting down on and standing up from a chair with arms (e.g., wheelchair, bedside commode, etc,.)?: A Lot Help needed moving to and from a bed to chair (including a wheelchair)?: A Little Help needed walking in hospital room?: A Little Help needed climbing 3-5 steps with a railing? : A Lot 6 Click Score: 14    End of Session Equipment Utilized During Treatment: Gait belt Activity Tolerance: Patient tolerated treatment well Patient left: in chair;with call bell/phone within reach Nurse Communication: Mobility status PT Visit Diagnosis: Unsteadiness on feet (R26.81);Other abnormalities of gait and mobility (R26.89);Muscle weakness (generalized) (M62.81);Pain Pain - Right/Left: Left Pain - part of body: Hip     Time: [id number] PT Time Calculation (min) (ACUTE ONLY): 25 min  Charges:  $Gait Training: 8-22 mins $Therapeutic Exercise: 8-22 mins                    G Codes:      Etta GrandchildSean Montgomery Favor, PT, DPT Acute Rehab Services Pager: [phone]    Etta GrandchildSean  Betul Brisky 02/12/2018, 4:44 PM

## 2018-02-12 NOTE — Anesthesia Postprocedure Evaluation (Signed)
Anesthesia Post Note  Patient: Ellen Sawyer  Procedure(s) Performed: LEFT TOTAL HIP ARTHROPLASTY ANTERIOR APPROACH (Left Hip)     Patient location during evaluation: PACU Anesthesia Type: Spinal Level of consciousness: oriented and awake and alert Pain management: pain level controlled Vital Signs Assessment: post-procedure vital signs reviewed and stable Respiratory status: spontaneous breathing, respiratory function stable and patient connected to nasal cannula oxygen Cardiovascular status: blood pressure returned to baseline and stable Postop Assessment: no headache, no backache and no apparent nausea or vomiting Anesthetic complications: no    Last Vitals:  Vitals:   02/12/18 0503 02/12/18 1447  BP: 101/79 120/67  Pulse: 84 95  Resp: 16 17  Temp: 36.8 C 37 C  SpO2: 100% 98%    Last Pain:  Vitals:   02/12/18 1447  TempSrc: Oral  PainSc:                  Navah Grondin,JAMES TERRILL

## 2018-02-12 NOTE — Evaluation (Signed)
Occupational Therapy Evaluation Patient Details Name: Ellen Sawyer MRN: [id number] DOB: 12-Jun-1956 Today's Date: 02/12/2018    History of Present Illness Pt. is a 62 y.o. female who recently underwent a L THA. Pt. PMH includes anxiety, ashtma, chest pain and pre-diabetes. Previous surgical history includes a lumbar laminectomy, four feet surgeries, and two herniated discs.   Clinical Impression   Pt was independent in ADL PTA. Currently, pt requires moderate assistance during bed mobility for placement of LEs and for trunk support. Pt requires minimal assistance during transfers and functional mobility for stability and steadying of walker. Pt was educated on compensatory techniques for LB ADL and currently requires mod assist for LB dressing and bathing. Throughout the session, pt was limited by pain and reported having muscle spasms the previous night. Pt would benefit from continued OT services to maximize independence in functional mobility and LB ADL. Pt discharge plan is to return home with spouse. Upon d/c recommend use of 3-1 for bathing and toileting with 24/7 supervision for safety.    Follow Up Recommendations  No OT follow up;Supervision/Assistance - 24 hour    Equipment Recommendations  None recommended by OT    Recommendations for Other Services       Precautions / Restrictions Precautions Precautions: None Precaution Comments: Direct anterior hip, no precautions Restrictions Weight Bearing Restrictions: Yes LLE Weight Bearing: Weight bearing as tolerated      Mobility Bed Mobility Overal bed mobility: Needs Assistance Bed Mobility: Supine to Sit     Supine to sit: Mod assist     General bed mobility comments: Required physical assist for placement of LEs and trunk to sitting; Pt. activity limited by pain  Transfers Overall transfer level: Needs assistance Equipment used: Rolling walker (2 wheeled) Transfers: Sit to/from Stand Sit to Stand: Min  assist         General transfer comment: Pt. required cueing for hand placement on walker during sit-stand transfer. Assist to boost up from EOB and for steadying in standing    Balance Overall balance assessment: Needs assistance Sitting-balance support: Bilateral upper extremity supported Sitting balance-Leahy Scale: Fair     Standing balance support: During functional activity;Bilateral upper extremity supported Standing balance-Leahy Scale: Poor                             ADL either performed or assessed with clinical judgement   ADL Overall ADL's : Needs assistance/impaired Eating/Feeding: Set up;Sitting   Grooming: Set up;Sitting   Upper Body Bathing: Set up;Supervision/ safety;Sitting   Lower Body Bathing: Moderate assistance;Sit to/from stand;Cueing for safety;Cueing for compensatory techniques   Upper Body Dressing : Set up;Supervision/safety;Sitting   Lower Body Dressing: Cueing for safety;Cueing for compensatory techniques;Sit to/from stand;Moderate assistance Lower Body Dressing Details (indicate cue type and reason): Pt. required assistance donning socks for functional mobility. Toilet Transfer: Ambulation;RW;Minimal assistance;Cueing for safety   Toileting- Clothing Manipulation and Hygiene: Minimal assistance;Cueing for safety;Cueing for compensatory techniques;Sit to/from stand       Functional mobility during ADLs: Minimal assistance;Rolling walker General ADL Comments: Pt. reported pain throughout session. Pt. was educated on compensatory strategies for LB dressing.     Vision         Perception     Praxis      Pertinent Vitals/Pain Pain Assessment: Faces Faces Pain Scale: Hurts even more Pain Location: L hip Pain Descriptors / Indicators: Discomfort;Grimacing;Sharp;Spasm;Moaning Pain Intervention(s): Limited activity within patient's tolerance;Monitored during session;Ice applied  Hand Dominance     Extremity/Trunk  Assessment Upper Extremity Assessment Upper Extremity Assessment: Overall WFL for tasks assessed   Lower Extremity Assessment Lower Extremity Assessment: Defer to PT evaluation   Cervical / Trunk Assessment Cervical / Trunk Assessment: Normal   Communication Communication Communication: No difficulties   Cognition Arousal/Alertness: Awake/alert Behavior During Therapy: WFL for tasks assessed/performed Overall Cognitive Status: Within Functional Limits for tasks assessed                                     General Comments       Exercises     Shoulder Instructions      Home Living Family/patient expects to be discharged to:: Private residence Living Arrangements: Spouse/significant other Available Help at Discharge: Family;Available 24 hours/day Type of Home: House Home Access: Stairs to enter [employer] of Steps: 4-5 Entrance Stairs-Rails: None Home Layout: One level     Bathroom Shower/Tub: Chief Strategy Officer: Handicapped height     Home Equipment: Environmental consultant - 2 wheels;Cane - single point;Bedside commode          Prior Functioning/Environment Level of Independence: Independent                 OT Problem List: Decreased strength;Decreased range of motion;Decreased activity tolerance;Impaired balance (sitting and/or standing);Decreased knowledge of use of DME or AE;Decreased knowledge of precautions;Pain;Increased edema      OT Treatment/Interventions: Self-care/ADL training;Energy conservation;DME and/or AE instruction;Therapeutic activities;Patient/family education;Balance training    OT Goals(Current goals can be found in the care plan section) Acute Rehab OT Goals Patient Stated Goal: decrease pain OT Goal Formulation: With patient Time For Goal Achievement: 02/26/18 Potential to Achieve Goals: Good ADL Goals Pt Will Perform Lower Body Bathing: with supervision;sit to/from stand Pt Will Perform Lower Body  Dressing: with supervision;sit to/from stand Pt Will Transfer to Toilet: with supervision;ambulating;bedside commode Pt Will Perform Toileting - Clothing Manipulation and hygiene: with supervision;sit to/from stand Pt Will Perform Tub/Shower Transfer: Tub transfer;with supervision;ambulating;3 in 1;rolling walker  OT Frequency: Min 2X/week   Barriers to D/C:            Co-evaluation              AM-PAC PT "6 Clicks" Daily Activity     Outcome Measure Help from another person eating meals?: None Help from another person taking care of personal grooming?: None Help from another person toileting, which includes using toliet, bedpan, or urinal?: A Little Help from another person bathing (including washing, rinsing, drying)?: A Lot Help from another person to put on and taking off regular upper body clothing?: A Little Help from another person to put on and taking off regular lower body clothing?: A Lot 6 Click Score: 18   End of Session Equipment Utilized During Treatment: Rolling walker Nurse Communication: Mobility status;Patient requests pain meds  Activity Tolerance: Patient tolerated treatment well Patient left: in chair;with call bell/phone within reach  OT Visit Diagnosis: Unsteadiness on feet (R26.81);Other abnormalities of gait and mobility (R26.89);Pain Pain - Right/Left: Left Pain - part of body: Hip                Time: 1610-9604 OT Time Calculation (min): 18 min Charges:  OT General Charges $OT Visit: 1 Visit OT Evaluation $OT Eval Moderate Complexity: 1 Mod G-Codes:     Joely Losier A. Brett Albino, M.S., OTR/L Pager: [phone]  Gaye Alken  02/12/2018, 11:08 AM

## 2018-02-12 NOTE — Op Note (Signed)
NAMLesleigh Sawyer:  Sawyer, Ellen           ACCOUNT NO.:  [id number]  MEDICAL RECORD NO.:  [id number]  LOCATION:                                 FACILITY:  PHYSICIAN:  Vanita PandaChristopher Y. Magnus IvanBlackman, M.D.DATE OF BIRTH:  [birth year]  DATE OF PROCEDURE:  02/11/2018 DATE OF DISCHARGE:                              OPERATIVE REPORT   PREOPERATIVE DIAGNOSIS:  Primary osteoarthritis and degenerative joint disease, left hip.  POSTOPERATIVE DIAGNOSIS:  Primary osteoarthritis and degenerative joint disease, left hip.  PROCEDURE:  Left total hip arthroplasty through direct anterior approach.  IMPLANTS:  DePuy Sector Gription acetabular component size 52 with a single screw, size 36 +0 polyethylene liner, size 11 Corail femoral component with standard offset, size 36 -2 metal hip ball.  SURGEON:  Vanita PandaChristopher Y. Magnus IvanBlackman, M.D.  ASSISTANT:  Richardean CanalGilbert Clark, PA-C.  ANESTHESIA:  Spinal.  ANTIBIOTICS:  900 mg of IV clindamycin.  BLOOD LOSS:  500 cc.  COMPLICATIONS:  None.  INDICATIONS:  [name] is a very pleasant 62 year old female with debilitating arthritis involving her left hip.  She walks with a significant limp.  Her x-ray showed complete loss of the left hip joint space.  Her right hip appears normal.  At this point, given the failure of conservative treatment and her daily pain as well as the detrimental effect this arthritis has had on her activities of daily living and her quality of life, she does wish to proceed with a total hip arthroplasty on the left side.  She understands the risk of acute blood loss anemia, nerve and vessel injury, fracture, infection, dislocation, DVT.  She understands our goals are to decrease pain, improve mobility and overall improved quality of life.  PROCEDURE DESCRIPTION:  After informed consent was obtained, appropriate left hip was marked.  She was brought to the operating room, where spinal anesthesia was obtained while she was on her stretcher.  She  was then laid in a supine position on the stretcher.  Foley catheter was placed and both feet had traction boots applied to them.  Next, she was placed supine on the Hana fracture table with the perineal post in place and both legs in inline skeletal traction devices, but no traction applied.  Her left operative hip was prepped and draped with DuraPrep and sterile drapes.  A time-out was called and she was identified as correct patient and correct left hip.  We then made an incision just inferior and posterior to the anterior superior iliac spine and carried this obliquely down the leg.  We dissected down the tensor fascia lata muscle.  The tensor fascia was then divided longitudinally to proceed with a direct anterior approach to the hip.  We identified and cauterized the circumflex vessels and then identified the hip capsule. I opened up the hip capsule in an L-type format, finding a large joint effusion and significant arthritis throughout her left hip.  We placed Cobra retractors around the medial and lateral femoral neck and then made our femoral neck cut with an oscillating saw and completed this with an osteotome.  This was all proximal to the lesser trochanter.  We then placed a corkscrew guide in the femoral head and removed the femoral head  in its entirety and found it to be completely devoid of cartilage.  We then placed a bent Hohmann over the medial acetabular rim and began reaming under direct visualization from a size 43 reamer going in stepwise increments up to a size 51 with all reamers under direct visualization, the last reamer under direct fluoroscopy, so we could obtain our depth of reaming, our inclination and anteversion.  We also cleaned the remnants of the acetabular labrum and another debris.  We then placed the real DePuy Sector Gription acetabular component size 52 and a single screw.  We placed the real Gription 52 acetabular component without difficulty.  I  then placed a single screw as well given the slight overhang.  We then placed a 36 +0 polyethylene liner for that size acetabular component.  Attention was then turned to the femur. With the leg externally rotated to 120 degrees, extended and adducted, I was able to place a Mueller retractor medially and a Hohmann retractor behind the greater trochanter.  I released the lateral joint capsule and used a box-cutting osteotome to enter the femoral canal and a rongeur to lateralize.  I then began broaching from a size 8 broach using the Corail broaching system going up to size 11.  Based on our anatomy with the size 11 in place, we trialed a varus offset femoral neck and went with a 36 -2 hip ball because we were having to go with the bigger acetabular component.  I felt like we just started off with a shorter ball.  We reduced this in the acetabulum and surprising it was tight with good stability and full range of motion.  With that being said, we dislocated the hip and removed the trial components.  I was able to place the real Corail femoral component with standard offset, size 11 and the real 36 -2 metal hip ball and reduced this in the acetabulum and it was stable.  We then irrigated the soft tissue with normal saline solution using pulsatile lavage.  We were able to close the joint capsule with interrupted #1 Ethibond suture followed by running #1 Vicryl in the tensor fascia, 0 Vicryl in the deep tissue, 2-0 Vicryl in the subcutaneous tissue, and given her adipose tissue in this area, we closed the skin with staples.  Xeroform and well-padded sterile dressing, Aquacel were applied.  She was taken off the Hana table and taken to the recovery room in stable condition.  All final counts were correct.  There were no complications noted.  Of note, Richardean Canal, PA- C assisted during the entire case.  His assistance was crucial for facilitating all aspects of this case.     Vanita Panda. Magnus Ivan, M.D.     CYB/MEDQ  D:  02/11/2018  T:  02/11/2018  Job:  [id number]

## 2018-02-13 ENCOUNTER — Encounter (HOSPITAL_COMMUNITY): Payer: Self-pay | Admitting: Orthopaedic Surgery

## 2018-02-13 LAB — CBC
HCT: 29.4 % — ABNORMAL LOW (ref 36.0–46.0)
Hemoglobin: 9.1 g/dL — ABNORMAL LOW (ref 12.0–15.0)
MCH: 25.6 pg — ABNORMAL LOW (ref 26.0–34.0)
MCHC: 31 g/dL (ref 30.0–36.0)
MCV: 82.6 fL (ref 78.0–100.0)
PLATELETS: 193 10*3/uL (ref 150–400)
RBC: 3.56 MIL/uL — AB (ref 3.87–5.11)
RDW: 17.8 % — ABNORMAL HIGH (ref 11.5–15.5)
WBC: 10.1 10*3/uL (ref 4.0–10.5)

## 2018-02-13 MED ORDER — METHOCARBAMOL 500 MG PO TABS: 500.0000 mg | ORAL_TABLET | Freq: Four times a day (QID) | ORAL | 0 refills | Status: DC | PRN

## 2018-02-13 MED ORDER — ASPIRIN EC 81 MG PO TBEC: 81.0000 mg | DELAYED_RELEASE_TABLET | Freq: Every day | ORAL | 0 refills | Status: DC

## 2018-02-13 MED ORDER — OXYCODONE HCL 5 MG PO TABS: 5.0000 mg | ORAL_TABLET | ORAL | 0 refills | Status: DC | PRN

## 2018-02-13 NOTE — Discharge Summary (Signed)
Patient ID: Ellen Sawyer MRN: [id number] DOB/AGE: Oct 17, 1956 62 y.o.  Admit date: 02/11/2018 Discharge date: 02/13/2018  Admission Diagnoses:  Principal Problem:   Unilateral primary osteoarthritis, left hip Active Problems:   Status post total replacement of left hip   Discharge Diagnoses:  Same  Past Medical History:  Diagnosis Date  . Anxiety    DULoxetine (CYMBALTA)  . Asthma   . Chest discomfort    in the past  . Chest pain    in the past  . History of hiatal hernia   . Hypercholesterolemia   . Hypertriglyceridemia   . Osteoarthritis of left hip   . PONV (postoperative nausea and vomiting)   . Pre-diabetes    year ago was on metformin, then she has been of metforming for many years    Surgeries: Procedure(s): LEFT TOTAL HIP ARTHROPLASTY ANTERIOR APPROACH on 02/11/2018   Consultants:   Discharged Condition: Improved  Hospital Course: Ellen Sawyer is an 62 y.o. female who was admitted 02/11/2018 for operative treatment ofUnilateral primary osteoarthritis, left hip. Patient has severe unremitting pain that affects sleep, daily activities, and work/hobbies. After pre-op clearance the patient was taken to the operating room on 02/11/2018 and underwent  Procedure(s): LEFT TOTAL HIP ARTHROPLASTY ANTERIOR APPROACH.    Patient was given perioperative antibiotics:  Anti-infectives (From admission, onward)   Start     Dose/Rate Route Frequency Ordered Stop   02/12/18 0600  clindamycin (CLEOCIN) IVPB 900 mg     900 mg 100 mL/hr over 30 Minutes Intravenous On call to O.R. 02/11/18 1230 02/11/18 1430   02/11/18 2100  clindamycin (CLEOCIN) IVPB 600 mg     600 mg 100 mL/hr over 30 Minutes Intravenous Every 6 hours 02/11/18 2020 02/12/18 0325       Patient was given sequential compression devices, early ambulation, and chemoprophylaxis to prevent DVT.  Patient benefited maximally from hospital stay and there were no complications.    Recent vital signs:   Patient Vitals for the past 24 hrs:  BP Temp Temp src Pulse Resp SpO2  02/13/18 1136 108/61 98.9 F (37.2 C) Oral 93 19 -  02/13/18 0700 118/68 99.2 F (37.3 C) Oral 96 (!) 22 98 %  02/13/18 0624 112/64 98.7 F (37.1 C) Oral 97 16 95 %  02/12/18 2103 107/68 100.1 F (37.8 C) Oral (!) 120 16 98 %  02/12/18 1447 120/67 98.6 F (37 C) Oral 95 17 98 %     Recent laboratory studies:  Recent Labs    02/12/18 0439 02/13/18 0742  WBC 10.6* 10.1  HGB 10.0* 9.1*  HCT 31.7* 29.4*  PLT 233 193  NA 138  --   K 4.4  --   CL 104  --   CO2 24  --   BUN 19  --   CREATININE 1.02*  --   GLUCOSE 135*  --   CALCIUM 8.6*  --      Discharge Medications:   Allergies as of 02/13/2018      Reactions   Ceftin [cefuroxime Axetil] Anaphylaxis, Other (See Comments)   Other   Cinobac [cinoxacin] Anaphylaxis      Medication List    STOP taking these medications   HYDROcodone-acetaminophen 5-325 MG tablet Commonly known as:  NORCO/VICODIN     TAKE these medications   aspirin EC 81 MG tablet Take 1 tablet (81 mg total) by mouth daily.   b complex vitamins tablet Take 1 tablet by mouth daily.   budesonide-formoterol 160-4.5  MCG/ACT inhaler Commonly known as:  SYMBICORT Inhale 2 puffs into the lungs 2 (two) times daily as needed.   DULoxetine 60 MG capsule Commonly known as:  CYMBALTA Take 60 mg by mouth daily.   gabapentin 300 MG capsule Commonly known as:  NEURONTIN Take 300 mg by mouth 3 (three) times daily.   levothyroxine 50 MCG tablet Commonly known as:  SYNTHROID, LEVOTHROID Take 50 mcg by mouth daily before breakfast.   methocarbamol 500 MG tablet Commonly known as:  ROBAXIN Take 1 tablet (500 mg total) by mouth every 6 (six) hours as needed for muscle spasms. What changed:  Another medication with the same name was added. Make sure you understand how and when to take each.   methocarbamol 500 MG tablet Commonly known as:  ROBAXIN Take 1 tablet (500 mg total) by  mouth every 6 (six) hours as needed for muscle spasms. What changed:  You were already taking a medication with the same name, and this prescription was added. Make sure you understand how and when to take each.   oxyCODONE 5 MG immediate release tablet Commonly known as:  Oxy IR/ROXICODONE Take 1-2 tablets (5-10 mg total) by mouth every 4 (four) hours as needed for moderate pain (pain score 4-6).   oxyCODONE-acetaminophen 5-325 MG tablet Commonly known as:  PERCOCET/ROXICET Take 1-2 tablets by mouth every 4 (four) hours as needed for moderate pain.   Vitamin D (Ergocalciferol) 50000 units Caps capsule Commonly known as:  DRISDOL Take 50,000 Units by mouth every Saturday. Saturdays            Durable Medical Equipment  (From admission, onward)        Start     Ordered   02/11/18 2021  DME 3 n 1  Once     02/11/18 2020   02/11/18 2021  DME Walker rolling  Once    Question:  Patient needs a walker to treat with the following condition  Answer:  Status post total replacement of left hip   02/11/18 2020      Diagnostic Studies: Dg Pelvis Portable  Result Date: 02/11/2018 CLINICAL DATA:  Left hip arthroplasty EXAM: PORTABLE PELVIS 1-2 VIEWS COMPARISON:  Intraoperative radiographs from earlier on the same day. FINDINGS: Subcutaneous emphysema is noted adjacent to a total left uncemented hip arthroplasty. No immediate postoperative complications. Alignment appears near anatomic on this frontal view. IMPRESSION: Intact left hip arthroplasty with postop change. Electronically Signed   By: Tollie Eth M.D.   On: 02/11/2018 18:24   Dg C-arm 1-60 Min  Result Date: 02/11/2018 CLINICAL DATA:  62 year old female post left hip replacement. Initial encounter. EXAM: DG C-ARM 61-120 MIN; OPERATIVE LEFT HIP WITH PELVIS COMPARISON:  01/14/2018 outside plain film exam.  12/25/2017 MR. Fluoroscopic time: 39 seconds. FINDINGS: Post total left hip replacement which appears in satisfactory position  without complication noted on frontal projection. IMPRESSION: Post left total hip replacement. Electronically Signed   By: Lacy Duverney M.D.   On: 02/11/2018 17:07   Dg Hip Operative Unilat W Or W/o Pelvis Left  Result Date: 02/11/2018 CLINICAL DATA:  61 year old female post left hip replacement. Initial encounter. EXAM: DG C-ARM 61-120 MIN; OPERATIVE LEFT HIP WITH PELVIS COMPARISON:  01/14/2018 outside plain film exam.  12/25/2017 MR. Fluoroscopic time: 39 seconds. FINDINGS: Post total left hip replacement which appears in satisfactory position without complication noted on frontal projection. IMPRESSION: Post left total hip replacement. Electronically Signed   By: Lacy Duverney M.D.   On:  02/11/2018 17:07    Disposition: 01-Home or Self Care    Follow-up Information    Kathryne Hitch, MD Follow up in 2 week(s).   Specialty:  Orthopedic Surgery Contact information: [address] [place] [address] [phone]            Signed: Kathryne Hitch 02/13/2018, 12:14 PM

## 2018-02-13 NOTE — Progress Notes (Signed)
Occupational Therapy Treatment Patient Details Name: Ellen Sawyer MRN: [id number] DOB: August 26, 1956 Today's Date: 02/13/2018    History of present illness Pt. is a 62 y.o. female who recently underwent a L THA. Pt. PMH includes anxiety, ashtma, chest pain and pre-diabetes. Previous surgical history includes a lumbar laminectomy, four feet surgeries, and two herniated discs.   OT comments  Pt. currently requires min guard during functional mobility for safety. Pt. completed toilet transfer and manipulated clothing with min guard. Pt. engaged in UB and LB dressing and required min assist for LB dressing with education on LB dressing compensatory strategies. Pt. was educated on the use of 3-1 for LB bathing in tub upon d/c. Pt. plans to return home with her spouse and will have 24/7 supervision for safety. D/c plan remains appropriate. Will continue to follow acutely.   Follow Up Recommendations  No OT follow up;Supervision/Assistance - 24 hour    Equipment Recommendations  None recommended by OT    Recommendations for Other Services      Precautions / Restrictions Precautions Precautions: None Precaution Comments: Direct anterior hip, no precautions Restrictions Weight Bearing Restrictions: Yes LLE Weight Bearing: Weight bearing as tolerated       Mobility Bed Mobility               General bed mobility comments: Pt. OOB in chair upon arrival.  Transfers Overall transfer level: Needs assistance Equipment used: Rolling walker (2 wheeled) Transfers: Sit to/from Stand Sit to Stand: Supervision         General transfer comment: Increased time; proper hand placement on walker; supervision for safety reasons.    Balance Overall balance assessment: Needs assistance Sitting-balance support: Bilateral upper extremity supported Sitting balance-Leahy Scale: Fair     Standing balance support: During functional activity Standing balance-Leahy Scale: Fair Standing  balance comment: Pt. was able to complete UB dressing standing without UE support.                           ADL either performed or assessed with clinical judgement   ADL Overall ADL's : Needs assistance/impaired     Grooming: Wash/dry hands;Min guard;Standing           Upper Body Dressing : Supervision/safety;Set up;Standing   Lower Body Dressing: Minimal assistance;Cueing for compensatory techniques;Sit to/from stand Lower Body Dressing Details (indicate cue type and reason): Physical assistance for donning pants; getting pants started over feet. Pt. implemented LB compensatory strategies during LB dressing. Toilet Transfer: Min guard;Ambulation;RW;BSC   Toileting- ArchitectClothing Manipulation and Hygiene: Supervision/safety;Sit to/from Nurse, children'sstand     Tub/Shower Transfer Details (indicate cue type and reason): Educated on use of 3-1 for LB bathing. Pt. states that spouse will assist with tub transfer. Functional mobility during ADLs: Min guard;Rolling walker General ADL Comments: Assistance required due to impaired balance and for safety.     Vision       Perception     Praxis      Cognition Arousal/Alertness: Awake/alert Behavior During Therapy: WFL for tasks assessed/performed Overall Cognitive Status: Within Functional Limits for tasks assessed                                          Exercises     Shoulder Instructions       General Comments      Pertinent Vitals/ Pain  Pain Assessment: Faces Faces Pain Scale: Hurts a little bit Pain Location: L hip Pain Descriptors / Indicators: Discomfort;Sore Pain Intervention(s): Monitored during session;Ice applied  Home Living                                          Prior Functioning/Environment              Frequency  Min 2X/week        Progress Toward Goals  OT Goals(current goals can now be found in the care plan section)  Progress towards OT goals:  Progressing toward goals  Acute Rehab OT Goals Patient Stated Goal: Return home OT Goal Formulation: With patient  Plan Discharge plan remains appropriate    Co-evaluation                 AM-PAC PT "6 Clicks" Daily Activity     Outcome Measure   Help from another person eating meals?: None Help from another person taking care of personal grooming?: A Little Help from another person toileting, which includes using toliet, bedpan, or urinal?: A Little Help from another person bathing (including washing, rinsing, drying)?: A Lot Help from another person to put on and taking off regular upper body clothing?: None Help from another person to put on and taking off regular lower body clothing?: A Little 6 Click Score: 19    End of Session Equipment Utilized During Treatment: Rolling walker  OT Visit Diagnosis: Unsteadiness on feet (R26.81);Other abnormalities of gait and mobility (R26.89);Pain Pain - Right/Left: Left Pain - part of body: Hip   Activity Tolerance Patient tolerated treatment well   Patient Left in chair;with call bell/phone within reach   Nurse Communication          Time: 1610-9604 OT Time Calculation (min): 16 min  Charges: OT General Charges $OT Visit: 1 Visit OT Treatments $Self Care/Home Management : 8-22 mins  Abednego Yeates A. Brett Albino, M.S., OTR/L Pager: [phone]   Gaye Alken 02/13/2018, 2:47 PM

## 2018-02-13 NOTE — Progress Notes (Signed)
Patient ID: Ellen Sawyer, female   DOB: Oct 01, 1956, 62 y.o.   MRN: [id number] Doing well overall.  Can be discharged to home this afternoon.

## 2018-02-13 NOTE — Progress Notes (Signed)
Physical Therapy Treatment and Discharge Patient Details Name: Ellen Sawyer MRN: [id number] DOB: 02-17-1956 Today's Date: 02/13/2018    History of Present Illness Pt. is a 62 y.o. female who recently underwent a L THA. Pt. PMH includes anxiety, ashtma, chest pain and pre-diabetes. Previous surgical history includes a lumbar laminectomy, four feet surgeries, and two herniated discs.    PT Comments    Patient has progressed well, performing stairs this session with husband at a supervision level. Pt and family educated on safety considerations for home and have no further questions or concerns at this time. Pt has met all functional goals and will benefit from skilled home health PT when medically cleared for d/c.     Follow Up Recommendations  Follow surgeon's recommendation for DC plan and follow-up therapies;Supervision/Assistance - 24 hour     Equipment Recommendations  None recommended by PT    Recommendations for Other Services       Precautions / Restrictions Precautions Precautions: None Precaution Comments: Direct anterior hip, no precautions Restrictions Weight Bearing Restrictions: Yes LLE Weight Bearing: Weight bearing as tolerated    Mobility  Bed Mobility Overal bed mobility: Needs Assistance Bed Mobility: Supine to Sit     Supine to sit: Supervision     General bed mobility comments: Pt. OOB in chair upon arrival.  Transfers Overall transfer level: Needs assistance Equipment used: Rolling walker (2 wheeled) Transfers: Sit to/from Stand Sit to Stand: Supervision         General transfer comment: Increased time; proper hand placement on walker; supervision for safety reasons.  Ambulation/Gait Ambulation/Gait assistance: Supervision Ambulation Distance (Feet): 50 Feet Assistive device: Rolling walker (2 wheeled) Gait Pattern/deviations: Step-to pattern;Step-through pattern Gait velocity: decreased   General Gait Details: progressed to  supervision. patient with more consistent step through pattern, increased velocity.   Stairs Stairs: Yes   Stair Management: No rails;Backwards;With walker Number of Stairs: 12 General stair comments: patient performing stairs with husband. education to husband with guarding, pt demonstrating good cary over from prior session with correct sequecning and form. no LOB.   Wheelchair Mobility    Modified Rankin (Stroke Patients Only)       Balance Overall balance assessment: Needs assistance Sitting-balance support: Bilateral upper extremity supported Sitting balance-Leahy Scale: Fair     Standing balance support: During functional activity Standing balance-Leahy Scale: Fair Standing balance comment: Pt. was able to complete UB dressing standing without UE support.                            Cognition Arousal/Alertness: Awake/alert Behavior During Therapy: WFL for tasks assessed/performed Overall Cognitive Status: Within Functional Limits for tasks assessed                                        Exercises      General Comments        Pertinent Vitals/Pain Pain Assessment: Faces Faces Pain Scale: Hurts a little bit Pain Location: L hip Pain Descriptors / Indicators: Discomfort;Sore Pain Intervention(s): Limited activity within patient's tolerance;Monitored during session;Premedicated before session;Repositioned    Home Living                      Prior Function            PT Goals (current goals can now be found in the  care plan section) Acute Rehab PT Goals Patient Stated Goal: Return home PT Goal Formulation: With patient Time For Goal Achievement: 02/19/18 Potential to Achieve Goals: Good Progress towards PT goals: Goals met/education completed, patient discharged from PT    Frequency           PT Plan Current plan remains appropriate    Co-evaluation              AM-PAC PT "6 Clicks" Daily Activity   Outcome Measure  Difficulty turning over in bed (including adjusting bedclothes, sheets and blankets)?: A Lot Difficulty moving from lying on back to sitting on the side of the bed? : A Lot Difficulty sitting down on and standing up from a chair with arms (e.g., wheelchair, bedside commode, etc,.)?: A Lot Help needed moving to and from a bed to chair (including a wheelchair)?: A Little Help needed walking in hospital room?: A Little Help needed climbing 3-5 steps with a railing? : A Little 6 Click Score: 15    End of Session Equipment Utilized During Treatment: Gait belt Activity Tolerance: Patient tolerated treatment well Patient left: in chair;with call bell/phone within reach Nurse Communication: Mobility status PT Visit Diagnosis: Unsteadiness on feet (R26.81);Other abnormalities of gait and mobility (R26.89);Muscle weakness (generalized) (M62.81);Pain Pain - Right/Left: Left Pain - part of body: Hip     Time: 9507-2257 PT Time Calculation (min) (ACUTE ONLY): 21 min  Charges:  $Gait Training: 8-22 mins                    G Codes:      Reinaldo Berber, PT, DPT Acute Rehab Services Pager: [phone]     Reinaldo Berber 02/13/2018, 4:44 PM

## 2018-02-13 NOTE — Progress Notes (Signed)
1600 Pt worked with PT on final session during her hospital stay.   Patient discharged to home. Verbalizes understanding of all discharge instructions including incision care, discharge medications, and follow up MD visits. Pt provided RX. Accompanied by spouse.

## 2018-02-13 NOTE — Progress Notes (Signed)
Physical Therapy Treatment Patient Details Name: Ellen Sawyer MRN: [id number] DOB: September 14, 1956 Today's Date: 02/13/2018    History of Present Illness Pt. is a 62 y.o. female who recently underwent a L THA. Pt. PMH includes anxiety, ashtma, chest pain and pre-diabetes. Previous surgical history includes a lumbar laminectomy, four feet surgeries, and two herniated discs.    PT Comments    Patient progressing well with therapy, introduced stair training this morning. Patient min guard level with RW backwards, will reinforce this afternoon with husband present to ensure safety proper guarding and technique. Anticipate pt will do well.    Follow Up Recommendations  Follow surgeon's recommendation for DC plan and follow-up therapies;Supervision/Assistance - 24 hour     Equipment Recommendations  None recommended by PT    Recommendations for Other Services       Precautions / Restrictions Precautions Precautions: None Precaution Comments: Direct anterior hip, no precautions Restrictions Weight Bearing Restrictions: Yes LLE Weight Bearing: Weight bearing as tolerated    Mobility  Bed Mobility Overal bed mobility: Needs Assistance Bed Mobility: Supine to Sit     Supine to sit: Supervision     General bed mobility comments: able to supine to sit without physical assistance today, increased time and effort  Transfers Overall transfer level: Needs assistance Equipment used: Rolling walker (2 wheeled) Transfers: Sit to/from Stand Sit to Stand: Supervision         General transfer comment: supervision, increased time and effort, good hand placement   Ambulation/Gait Ambulation/Gait assistance: Supervision Ambulation Distance (Feet): 50 Feet Assistive device: Rolling walker (2 wheeled) Gait Pattern/deviations: Step-to pattern;Step-through pattern Gait velocity: decreased   General Gait Details: progressed to supervision. patient with more consistent step through  pattern, increased velocity.   Stairs Stairs: Yes   Stair Management: No rails;Backwards;With walker Number of Stairs: 8 General stair comments: min guard for stairs, cues for sequencing and technique with RW backwards.   Wheelchair Mobility    Modified Rankin (Stroke Patients Only)       Balance Overall balance assessment: Needs assistance Sitting-balance support: Bilateral upper extremity supported Sitting balance-Leahy Scale: Fair     Standing balance support: During functional activity;Bilateral upper extremity supported Standing balance-Leahy Scale: Poor Standing balance comment: RW support due to pain                            Cognition Arousal/Alertness: Awake/alert Behavior During Therapy: WFL for tasks assessed/performed Overall Cognitive Status: Within Functional Limits for tasks assessed                                        Exercises      General Comments        Pertinent Vitals/Pain Pain Assessment: Faces Faces Pain Scale: Hurts even more Pain Location: L hip Pain Descriptors / Indicators: Discomfort;Grimacing;Sharp;Spasm;Moaning    Home Living                      Prior Function            PT Goals (current goals can now be found in the care plan section) Acute Rehab PT Goals Patient Stated Goal: decrease pain PT Goal Formulation: With patient Time For Goal Achievement: 02/19/18 Potential to Achieve Goals: Good Progress towards PT goals: Progressing toward goals    Frequency    7X/week  PT Plan Current plan remains appropriate    Co-evaluation              AM-PAC PT "6 Clicks" Daily Activity  Outcome Measure  Difficulty turning over in bed (including adjusting bedclothes, sheets and blankets)?: A Lot Difficulty moving from lying on back to sitting on the side of the bed? : A Lot Difficulty sitting down on and standing up from a chair with arms (e.g., wheelchair, bedside  commode, etc,.)?: A Lot Help needed moving to and from a bed to chair (including a wheelchair)?: A Little Help needed walking in hospital room?: A Little Help needed climbing 3-5 steps with a railing? : A Little 6 Click Score: 15    End of Session Equipment Utilized During Treatment: Gait belt Activity Tolerance: Patient tolerated treatment well Patient left: in chair;with call bell/phone within reach Nurse Communication: Mobility status PT Visit Diagnosis: Unsteadiness on feet (R26.81);Other abnormalities of gait and mobility (R26.89);Muscle weakness (generalized) (M62.81);Pain Pain - Right/Left: Left Pain - part of body: Hip     Time: [id number] PT Time Calculation (min) (ACUTE ONLY): 31 min  Charges:  $Gait Training: 23-37 mins                    G Codes:       Etta GrandchildSean Korena Nass, PT, DPT Acute Rehab Services Pager: [phone]     Etta GrandchildSean  Bonnye Halle 02/13/2018, 9:33 AM

## 2018-02-13 NOTE — Discharge Instructions (Signed)

## 2018-02-17 ENCOUNTER — Telehealth (INDEPENDENT_AMBULATORY_CARE_PROVIDER_SITE_OTHER): Payer: Self-pay

## 2018-02-17 NOTE — Telephone Encounter (Signed)
IC verbal given.  

## 2018-02-17 NOTE — Telephone Encounter (Signed)
Tiffany with [facility] would like verbal orders for  4 x wk for 1 week 1 x wk for 1 week CB# is [phone].  Please advise.  Thank you.

## 2018-02-17 NOTE — Telephone Encounter (Signed)
Call to advise.

## 2018-02-25 ENCOUNTER — Emergency Department (HOSPITAL_COMMUNITY)
Admission: EM | Admit: 2018-02-25 | Discharge: 2018-02-26 | Disposition: A | Discharge status: Home / Self Care | Payer: PRIVATE HEALTH INSURANCE | Attending: Emergency Medicine | Admitting: Emergency Medicine

## 2018-02-25 ENCOUNTER — Emergency Department (HOSPITAL_BASED_OUTPATIENT_CLINIC_OR_DEPARTMENT_OTHER)
Admit: 2018-02-25 | Discharge: 2018-02-25 | Disposition: A | Discharge status: Home / Self Care | Payer: PRIVATE HEALTH INSURANCE | Attending: Emergency Medicine | Admitting: Emergency Medicine

## 2018-02-25 ENCOUNTER — Ambulatory Visit (HOSPITAL_COMMUNITY): Admission: RE | Admit: 2018-02-25 | Payer: PRIVATE HEALTH INSURANCE | Source: Ambulatory Visit

## 2018-02-25 ENCOUNTER — Other Ambulatory Visit: Payer: Self-pay

## 2018-02-25 ENCOUNTER — Encounter (HOSPITAL_COMMUNITY): Payer: Self-pay

## 2018-02-25 ENCOUNTER — Other Ambulatory Visit (INDEPENDENT_AMBULATORY_CARE_PROVIDER_SITE_OTHER): Payer: Self-pay | Admitting: Orthopaedic Surgery

## 2018-02-25 ENCOUNTER — Encounter (INDEPENDENT_AMBULATORY_CARE_PROVIDER_SITE_OTHER): Payer: Self-pay | Admitting: Orthopaedic Surgery

## 2018-02-25 ENCOUNTER — Emergency Department (HOSPITAL_COMMUNITY): Payer: PRIVATE HEALTH INSURANCE

## 2018-02-25 ENCOUNTER — Ambulatory Visit (INDEPENDENT_AMBULATORY_CARE_PROVIDER_SITE_OTHER): Payer: PRIVATE HEALTH INSURANCE | Admitting: Orthopaedic Surgery

## 2018-02-25 DIAGNOSIS — M7989 Other specified soft tissue disorders: Secondary | ICD-10-CM

## 2018-02-25 DIAGNOSIS — Z96642 Presence of left artificial hip joint: Secondary | ICD-10-CM | POA: Insufficient documentation

## 2018-02-25 DIAGNOSIS — R2242 Localized swelling, mass and lump, left lower limb: Secondary | ICD-10-CM | POA: Diagnosis not present

## 2018-02-25 DIAGNOSIS — Z7982 Long term (current) use of aspirin: Secondary | ICD-10-CM | POA: Insufficient documentation

## 2018-02-25 DIAGNOSIS — R0789 Other chest pain: Secondary | ICD-10-CM | POA: Diagnosis not present

## 2018-02-25 DIAGNOSIS — J45909 Unspecified asthma, uncomplicated: Secondary | ICD-10-CM | POA: Insufficient documentation

## 2018-02-25 DIAGNOSIS — M79609 Pain in unspecified limb: Secondary | ICD-10-CM

## 2018-02-25 DIAGNOSIS — Z79899 Other long term (current) drug therapy: Secondary | ICD-10-CM | POA: Insufficient documentation

## 2018-02-25 DIAGNOSIS — R079 Chest pain, unspecified: Secondary | ICD-10-CM | POA: Diagnosis present

## 2018-02-25 DIAGNOSIS — M79605 Pain in left leg: Secondary | ICD-10-CM

## 2018-02-25 LAB — CBC
HEMATOCRIT: 30.3 % — AB (ref 36.0–46.0)
HEMOGLOBIN: 9.4 g/dL — AB (ref 12.0–15.0)
MCH: 26.1 pg (ref 26.0–34.0)
MCHC: 31 g/dL (ref 30.0–36.0)
MCV: 84.2 fL (ref 78.0–100.0)
Platelets: 436 10*3/uL — ABNORMAL HIGH (ref 150–400)
RBC: 3.6 MIL/uL — ABNORMAL LOW (ref 3.87–5.11)
RDW: 17.7 % — ABNORMAL HIGH (ref 11.5–15.5)
WBC: 7.2 10*3/uL (ref 4.0–10.5)

## 2018-02-25 LAB — BASIC METABOLIC PANEL
ANION GAP: 6 (ref 5–15)
BUN: 17 mg/dL (ref 6–20)
CO2: 27 mmol/L (ref 22–32)
Calcium: 8.7 mg/dL — ABNORMAL LOW (ref 8.9–10.3)
Chloride: 107 mmol/L (ref 101–111)
Creatinine, Ser: 0.78 mg/dL (ref 0.44–1.00)
GFR calc Af Amer: >60 mL/min (ref >60)
GLUCOSE: 101 mg/dL — AB (ref 65–99)
POTASSIUM: 4.1 mmol/L (ref 3.5–5.1)
Sodium: 140 mmol/L (ref 135–145)

## 2018-02-25 LAB — D-DIMER, QUANTITATIVE: D-Dimer, Quant: 2.96 ug/mL-FEU — ABNORMAL HIGH (ref 0.00–0.50)

## 2018-02-25 LAB — I-STAT TROPONIN, ED
TROPONIN I, POC: 0 ng/mL (ref 0.00–0.08)
Troponin i, poc: 0 ng/mL (ref 0.00–0.08)

## 2018-02-25 MED ORDER — IOPAMIDOL (ISOVUE-370) INJECTION 76%
INTRAVENOUS | Status: AC
  Administered 2018-02-26: 100 mL
  Filled 2018-02-25: qty 100

## 2018-02-25 NOTE — Progress Notes (Signed)
HPI: [name] returns today 2 weeks status post left total hip arthroplasty.  She states hip overall is doing well.  She is complaining of left calf pain and some chest pain over the weekend he continued as of yesterday she has had no chest pain today.  She denies any previous chest pain.  Denies any reflux or heartburn.  She is having no shortness of breath.  She is on 81 mg aspirin twice daily.  She does not smoke.  Physical exam: General well-developed well-nourished female in no acute distress. Left hip: Surgical incision is healing well no signs of infection.  Left calf is edematous and tender.  She is able to ambulate with a walker.  Impression: Status post left total hip arthroplasty 02/11/2018 Calf and chest pain  Plan: This point time will vascular to go to the ER to be evaluated for possible PE and/or DVT of the lower leg.  She will go immediately to the ER [facility] from our office.  Otherwise scar tissue mobilization encouraged.  She will continue to work with therapy for range of motion strengthening.  We will see her back in a month sooner if there is any questions concerns.  Questions were encouraged and answered by Dr. Magnus Sawyer myself.

## 2018-02-25 NOTE — ED Provider Notes (Signed)
  Physical Exam  BP (!) 165/86   Pulse 83   Temp 97.9 F (36.6 C) (Oral)   Resp (!) 21   Ht 5' 0.5" (1.537 m)   Wt 84.8 kg (187 lb)   SpO2 100%   BMI 35.92 kg/m   Physical Exam  ED Course/Procedures     Procedures  MDM   Pt comes in with cc of chest pain and leg swelling. Dimer is elevated. DVT study is neg. No concerns for infection. CP x 2 weeks. Delta trop is neg. CT PE is pending. Dipo per CT PE.       Derwood KaplanNanavati, Eisley Barber, MD 02/27/18 2325

## 2018-02-25 NOTE — ED Triage Notes (Signed)
Pt sent by Dr Rayburn MaBlackmon from ortho due to left leg pain/swelling and chest pain/shob for rule out of blood clots. Pt had left hip replacement on 02/11/18. Pt speaking in complete sentences. Pt already has order for VAS [place]

## 2018-02-25 NOTE — ED Provider Notes (Signed)
[facility] Provider Note   CSN: [id number] Arrival date & time: 02/25/18  1424     History   Chief Complaint Chief Complaint  Patient presents with  . Leg Pain  . Chest Pain    HPI Ellen Sawyer is a 62 y.o. female.  HPI  62 year old female presents with left leg swelling and chest pain. On 3/12 she had a left hip replacement by Dr. Magnus IvanBlackman.  She states she has had left leg swelling since but the calf and foot have been more swollen recently.  It is painful in the posterior aspect of her calf.  She states for the past 1 week she is had on and off chest pain.  She describes it as sharp in the middle of her chest and goes up to her neck.  She can feel it run up her chest.  No current chest pain. No abdominal pain or back pain.  Has some associated shortness of breath.  Overall the symptoms come and go multiple times a day and last about 5-10 minutes.  Nothing brings them on including food or exertion.  Saw her orthopedist today who sent her to be evaluated for PE and DVT.  She had an ultrasound earlier in the day that showed no DVT in her leg.   Past Medical History:  Diagnosis Date  . Anxiety    DULoxetine (CYMBALTA)  . Asthma   . Chest discomfort    in the past  . Chest pain    in the past  . History of hiatal hernia   . Hypercholesterolemia   . Hypertriglyceridemia   . Osteoarthritis of left hip   . PONV (postoperative nausea and vomiting)   . Pre-diabetes    year ago was on metformin, then she has been of metforming for many years    Patient Active Problem List   Diagnosis Date Noted  . 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 Unilateral primary osteoarthritis, left hip 01/14/2018  . Spondylolisthesis at L4-L5 level 09/09/2017    Past Surgical History:  Procedure Laterality Date  . CARDIAC CATHETERIZATION     2008  . CESAREAN SECTION  1986 and 1989  . COLONOSCOPY    . feet surgery  1990   four  .  herniated disc  1990 and 1992   two  . LUMBAR LAMINECTOMY     rods screws, cadiver bone   . TONSILLECTOMY    . TOOTH EXTRACTION    . TOTAL HIP ARTHROPLASTY Left 02/11/2018  . TOTAL HIP ARTHROPLASTY Left 02/11/2018   Procedure: LEFT TOTAL HIP ARTHROPLASTY ANTERIOR APPROACH;  Surgeon: Kathryne HitchBlackman, Christopher Y, MD;  Location: [facility] OR;  Service: Orthopedics;  Laterality: Left;  . WISDOM TOOTH EXTRACTION       OB History   None      Home Medications    Prior to Admission medications   Medication Sig Start Date End Date Taking? Authorizing Provider  aspirin EC 81 MG tablet Take 1 tablet (81 mg total) by mouth daily. 02/13/18  Yes Kathryne HitchBlackman, Christopher Y, MD  b complex vitamins tablet Take 1 tablet by mouth daily.   Yes [provider]  budesonide-formoterol (SYMBICORT) 160-4.5 MCG/ACT inhaler Inhale 2 puffs into the lungs 2 (two) times daily as needed (for shortness of breath).    Yes [provider]  DULoxetine (CYMBALTA) 60 MG capsule Take 60 mg by mouth daily.   Yes [provider]  levothyroxine (SYNTHROID, LEVOTHROID)  50 MCG tablet Take 50 mcg by mouth daily before breakfast.    Yes [provider]  methocarbamol (ROBAXIN) 500 MG tablet Take 1 tablet (500 mg total) by mouth every 6 (six) hours as needed for muscle spasms. 02/13/18  Yes Kathryne Hitch, MD  oxyCODONE (OXY IR/ROXICODONE) 5 MG immediate release tablet Take 1-2 tablets (5-10 mg total) by mouth every 4 (four) hours as needed for moderate pain (pain score 4-6). 02/13/18  Yes Kathryne Hitch, MD  Vitamin D, Ergocalciferol, (DRISDOL) 50000 units CAPS capsule Take 50,000 Units by mouth every 7 (seven) days. Saturdays 03/08/17  Yes [provider]  gabapentin (NEURONTIN) 300 MG capsule Take 300 mg by mouth 3 (three) times daily.    [provider]  methocarbamol (ROBAXIN) 500 MG tablet Take 1 tablet (500 mg total) by mouth every 6 (six) hours as needed for muscle  spasms. Patient not taking: Reported on 01/14/2018 09/11/17   Barnett Abu, MD  oxyCODONE-acetaminophen (PERCOCET/ROXICET) 5-325 MG tablet Take 1-2 tablets by mouth every 4 (four) hours as needed for moderate pain. Patient not taking: Reported on 01/14/2018 09/11/17   Barnett Abu, MD    Family History Family History  Problem Relation Age of Onset  . Coronary artery disease Father        1970, several bypass  . Dementia Father   . Pancreatic cancer Mother     Social History Social History   Tobacco Use  . Smoking status: Never Smoker  . Smokeless tobacco: Never Used  Substance Use Topics  . Alcohol use: No  . Drug use: No     Allergies   Ceftin [cefuroxime axetil] and Cinobac [cinoxacin]   Review of Systems Review of Systems  Respiratory: Positive for shortness of breath.   Cardiovascular: Positive for chest pain and leg swelling.  Gastrointestinal: Negative for abdominal pain.  Musculoskeletal: Positive for myalgias.  Neurological: Negative for weakness and numbness.  All other systems reviewed and are negative.    Physical Exam Updated Vital Signs BP (!) 165/86   Pulse 83   Temp 97.9 F (36.6 C) (Oral)   Resp (!) 21   Ht 5' 0.5" (1.537 m)   Wt 84.8 kg (187 lb)   SpO2 100%   BMI 35.92 kg/m   Physical Exam  Constitutional: She is oriented to person, place, and time. She appears well-developed and well-nourished.  Non-toxic appearance. She does not appear ill.  HENT:  Head: Normocephalic and atraumatic.  Right Ear: External ear normal.  Left Ear: External ear normal.  Nose: Nose normal.  Eyes: Right eye exhibits no discharge. Left eye exhibits no discharge.  Cardiovascular: Normal rate, regular rhythm and normal heart sounds.  Pulses:      Dorsalis pedis pulses are 2+ on the right side, and 2+ on the left side.  Pulmonary/Chest: Effort normal and breath sounds normal.  Abdominal: Soft. There is no tenderness.  Musculoskeletal:       Left lower leg:  She exhibits edema (LLE diffusely swollen with mild tenderness in calf and foot).  Neurological: She is alert and oriented to person, place, and time.  Skin: Skin is warm and dry.  Nursing note and vitals reviewed.    ED Treatments / Results  Labs (all labs ordered are listed, but only abnormal results are displayed) Labs Reviewed  BASIC METABOLIC PANEL - Abnormal; Notable for the following components:      Result Value   Glucose, Bld 101 (*)    Calcium 8.7 (*)  All other components within normal limits  CBC - Abnormal; Notable for the following components:   RBC 3.60 (*)    Hemoglobin 9.4 (*)    HCT 30.3 (*)    RDW 17.7 (*)    Platelets 436 (*)    All other components within normal limits  D-DIMER, QUANTITATIVE (NOT AT [facility]) - Abnormal; Notable for the following components:   D-Dimer, Quant 2.96 (*)    All other components within normal limits  I-STAT TROPONIN, ED  I-STAT TROPONIN, ED    EKG  ED ECG REPORT   Date: 02/25/2018  Rate: 96  Rhythm: normal sinus rhythm  QRS Axis: normal  Intervals: normal  ST/T Wave abnormalities: normal  Conduction Disutrbances:none  Narrative Interpretation: no acute ST/T changes  Old EKG Reviewed: unchanged  I have personally reviewed the EKG tracing and agree with the computerized printout as noted.   Radiology Dg Chest 2 View  Result Date: 02/25/2018 CLINICAL DATA:  Chest pain and shortness of breath. Two weeks postop from left hip replacement. EXAM: CHEST - 2 VIEW COMPARISON:  08/09/2011 FINDINGS: The heart size and mediastinal contours are within normal limits. Both lungs are clear. The visualized skeletal structures are unremarkable. IMPRESSION: Negative.  No active cardiopulmonary disease. Electronically Signed   By: Myles Rosenthal M.D.   On: 02/25/2018 16:23   Bilateral lower extremity venous duplex completed. No evidence of DVT, superficial thrombosis, or Baker's cyst. Toma Deiters RVS 02/25/2018, 3:45  PM   Procedures Procedures (including critical care time)  Medications Ordered in ED Medications - No data to display   Initial Impression / Assessment and Plan / ED Course  I have reviewed the triage vital signs and the nursing notes.  Pertinent labs & imaging results that were available during my care of the patient were reviewed by me and considered in my medical decision making (see chart for details).     Patient is of moderate PE risk given the recent surgery.  Her unilateral leg swelling is likely more related to the surgery on that same leg rather than DVT given the negative ultrasound.  Low concern for an infection in that leg.  At this point, given the vague on and off chest pain I think she will need workup for PE which includes d-dimer and since that is positive a CT scan.  CT scan currently pending wound care transferred to Dr. Rhunette Croft.  My suspicion for ACS is quite low given the quite atypical pain and on and off symptoms for 1 week with negative ECGs and troponins.  If CT is negative she can follow-up with her PCP.  Final Clinical Impressions(s) / ED Diagnoses   Final diagnoses:  None    ED Discharge Orders    None       Pricilla Loveless, MD 02/25/18 2348

## 2018-02-25 NOTE — Progress Notes (Signed)
Bilateral lower extremity venous duplex completed. No evidence of DVT, superficial thrombosis, or Baker's cyst. Ellen DeitersVirginia Mylan Sawyer RVS 02/25/2018, 3:45 PM

## 2018-02-26 ENCOUNTER — Emergency Department (HOSPITAL_COMMUNITY): Payer: PRIVATE HEALTH INSURANCE

## 2018-02-26 ENCOUNTER — Encounter (HOSPITAL_COMMUNITY): Payer: Self-pay | Admitting: Radiology

## 2018-02-26 NOTE — Discharge Instructions (Signed)

## 2018-02-26 NOTE — ED Notes (Signed)
Patient transported to CT 

## 2018-03-31 ENCOUNTER — Ambulatory Visit (INDEPENDENT_AMBULATORY_CARE_PROVIDER_SITE_OTHER): Payer: PRIVATE HEALTH INSURANCE | Admitting: Orthopaedic Surgery

## 2018-03-31 ENCOUNTER — Encounter (INDEPENDENT_AMBULATORY_CARE_PROVIDER_SITE_OTHER): Payer: Self-pay | Admitting: Orthopaedic Surgery

## 2018-03-31 DIAGNOSIS — Z96642 Presence of left artificial hip joint: Secondary | ICD-10-CM

## 2018-03-31 MED ORDER — DOXYCYCLINE HYCLATE 100 MG PO TABS: 100.0000 mg | ORAL_TABLET | Freq: Two times a day (BID) | ORAL | 0 refills | Status: DC

## 2018-03-31 NOTE — Progress Notes (Signed)
The patient is now 6 weeks status post a left total hip arthroplasty through direct anterior approach.  She is concerned about a slight opening at the top of her hip incision.  She does have a significant body fold in the groin crease.  She does have document of movement at home.  On exam does not appear to be infected at all.  There is a slight opening at the top.  I do feel that she will do well with Bactroban ointment on this daily.  I will send in a prophylactic antibiotic as well.  She still ambulating with a cane and having her daughter drive her.  She works in a Administrator, [employer] for long hours a day.  We will keep her out of work for at least 2 more weeks and reevaluate her then determine can she get back to work or not.

## 2018-04-12 ENCOUNTER — Other Ambulatory Visit (INDEPENDENT_AMBULATORY_CARE_PROVIDER_SITE_OTHER): Payer: Self-pay | Admitting: Orthopaedic Surgery

## 2018-04-14 ENCOUNTER — Ambulatory Visit (INDEPENDENT_AMBULATORY_CARE_PROVIDER_SITE_OTHER): Payer: PRIVATE HEALTH INSURANCE | Admitting: Orthopaedic Surgery

## 2018-04-14 ENCOUNTER — Encounter (INDEPENDENT_AMBULATORY_CARE_PROVIDER_SITE_OTHER): Payer: Self-pay | Admitting: Orthopaedic Surgery

## 2018-04-14 DIAGNOSIS — Z96642 Presence of left artificial hip joint: Secondary | ICD-10-CM

## 2018-04-14 NOTE — Telephone Encounter (Signed)
Does not need anymore

## 2018-04-14 NOTE — Telephone Encounter (Signed)
Deny

## 2018-04-14 NOTE — Progress Notes (Signed)
The patient is 2 months status post a left total hip arthroplasty.  She is doing well overall.  She is ready to resume work next week.  Examination of her left hip incision is healed over completely now.  She had a slight dehiscence of the very top of that healed over completely.  Her leg lengths are equal.  She is walking without assistive device.  She is also recovering from major spine surgery was done 7 months ago.  She tolerates me letting her move her left hip around easily.  At this point I will give her a note to resume work activity starting May 21.  I do not need to see her back for 6 months unless she is having issues.  At that visit I would like a low AP pelvis and lateral of her left operative hip.

## 2018-06-09 ENCOUNTER — Telehealth (INDEPENDENT_AMBULATORY_CARE_PROVIDER_SITE_OTHER): Payer: Self-pay | Admitting: Orthopaedic Surgery

## 2018-06-09 NOTE — Telephone Encounter (Signed)
Patient left voicemail message requesting a call from Leeerry, did not state why. Pt's call back number [phone]

## 2018-06-10 NOTE — Telephone Encounter (Signed)
IC - Ellen Sawyer has a dental cleaning on 06/30/18. She wanted to know if antibiotics were needed.  Per Dr. Eliberto IvoryBlackman's protocol, if it has been longer than 3 months since the hip replacement (hers was on 02/11/18, so she is now 4 months out), no antibiotics are needed.  She will call back if she needs a note stating this faxed or mailed.

## 2018-07-02 IMAGING — CR DG CHEST 2V
2 series · 2 of 2 positions shown · non-contrast
Comparison: 08/09/2011

CLINICAL DATA: Chest pain and shortness of breath. Two weeks postop
from left hip replacement.

EXAM:
CHEST - 2 VIEW

[chest pa]
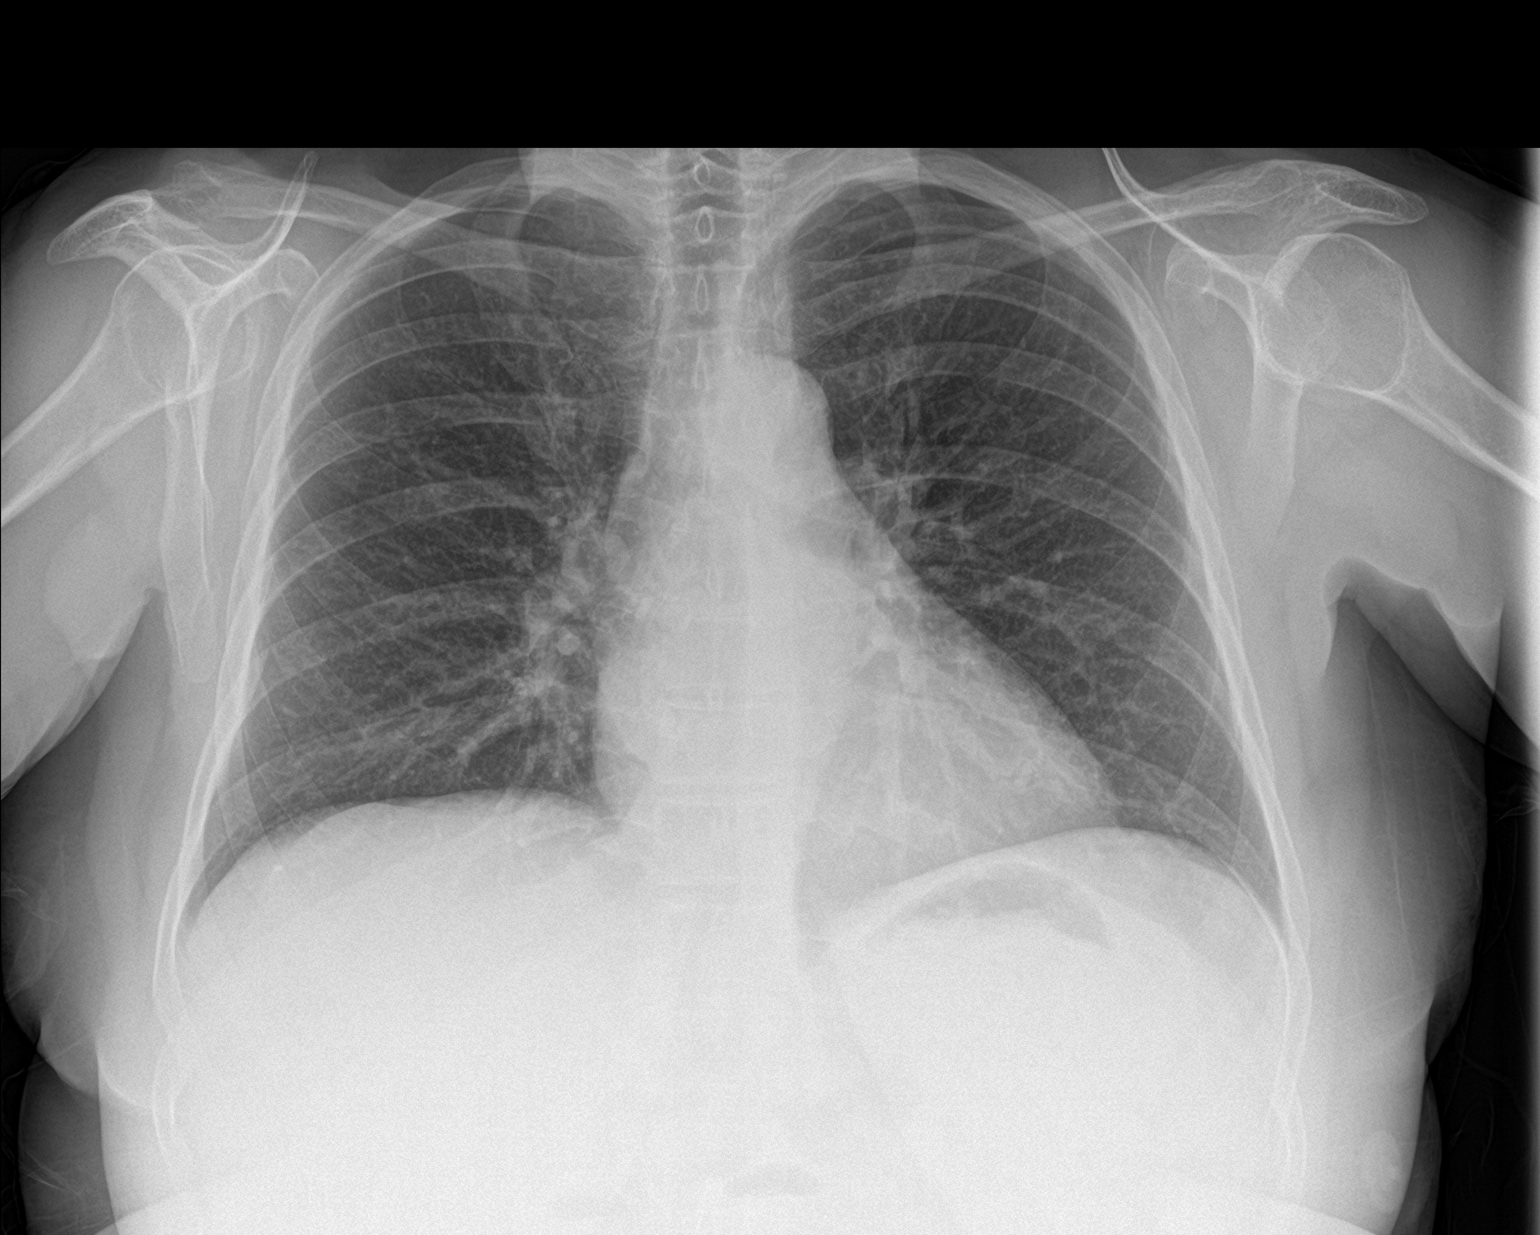

[chest lat]
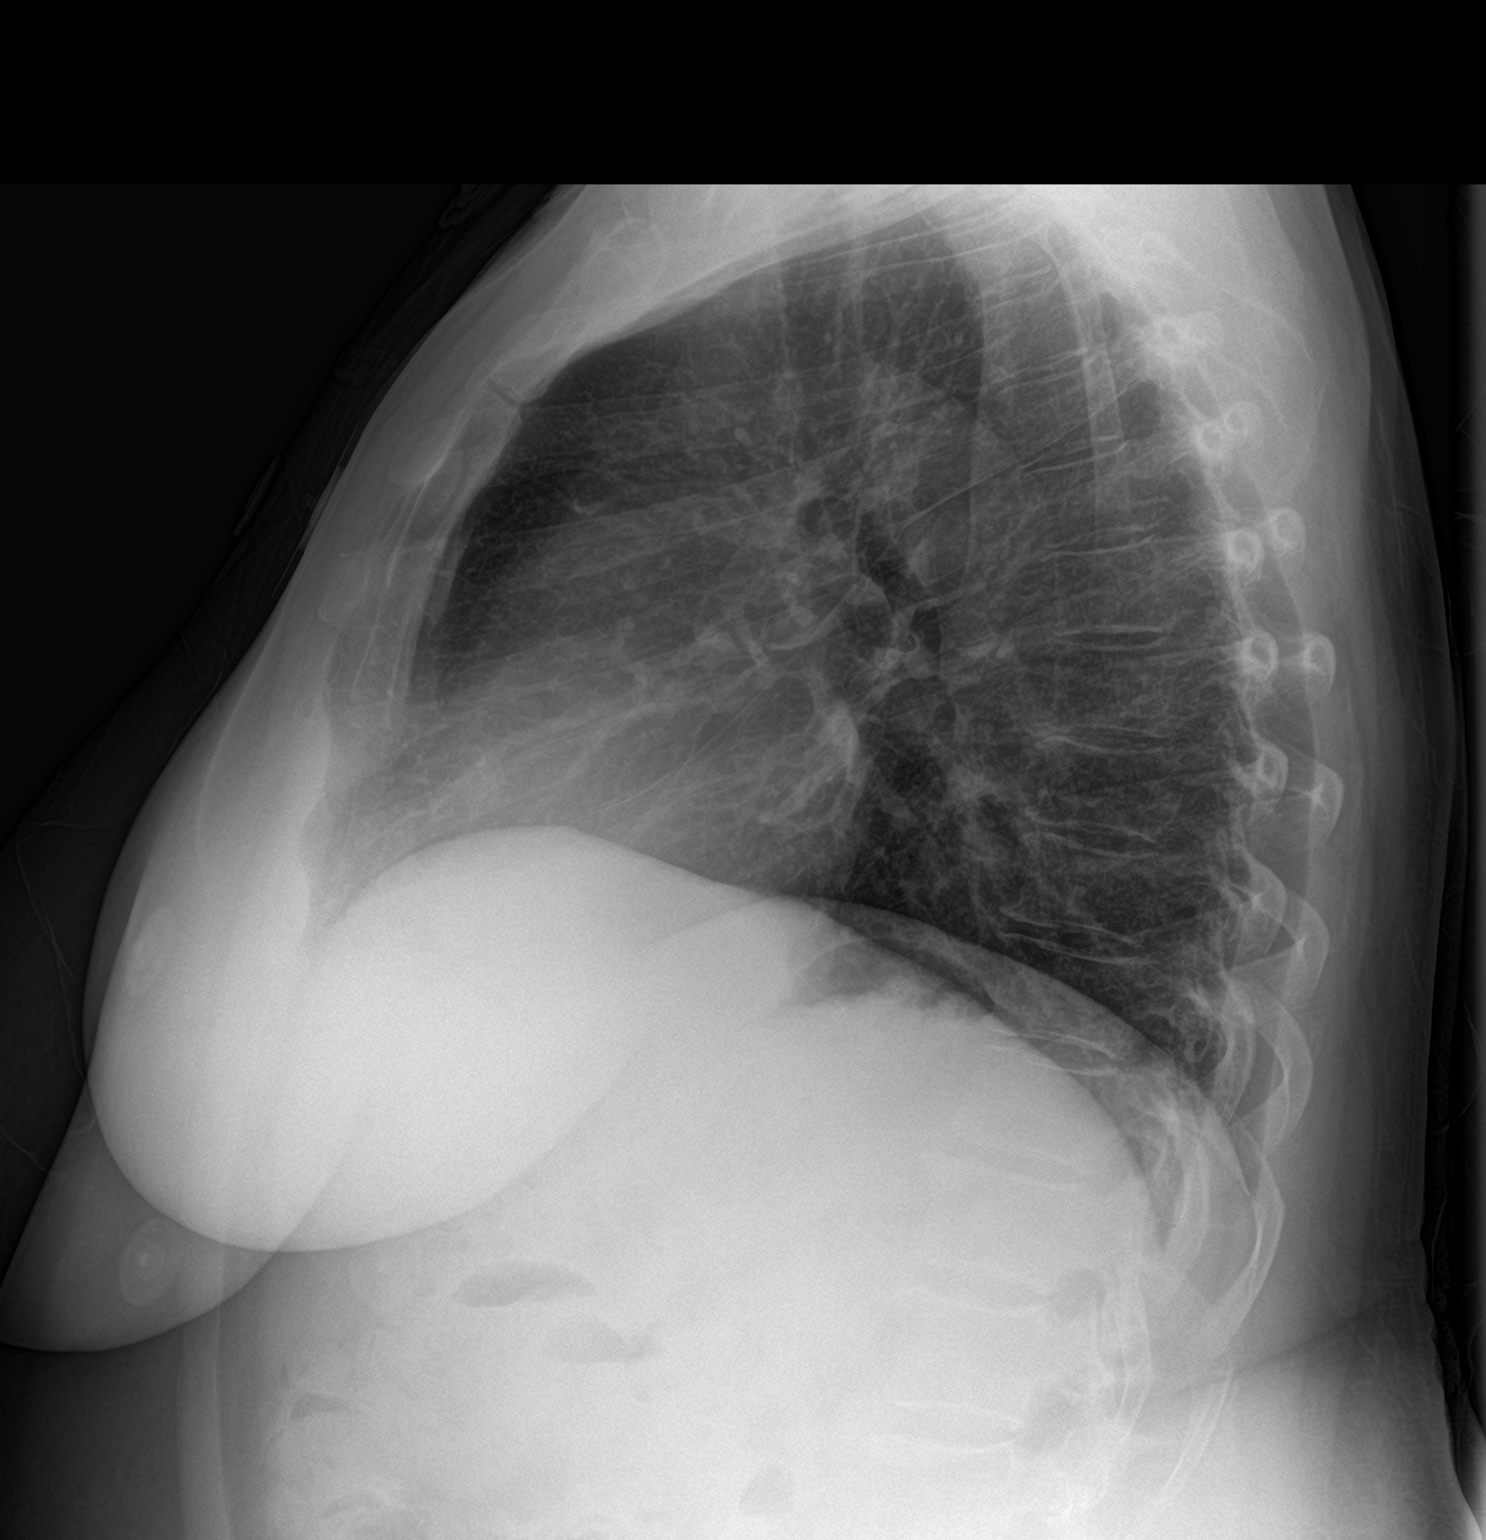

[2 of 2 positions shown; findings below may reference images not displayed]

FINDINGS: The heart size and mediastinal contours are within normal limits.
Both lungs are clear. The visualized skeletal structures are
unremarkable.
IMPRESSION: Negative.  No active cardiopulmonary disease.

## 2018-10-13 ENCOUNTER — Encounter (INDEPENDENT_AMBULATORY_CARE_PROVIDER_SITE_OTHER): Payer: Self-pay | Admitting: Orthopaedic Surgery

## 2018-10-13 ENCOUNTER — Ambulatory Visit (INDEPENDENT_AMBULATORY_CARE_PROVIDER_SITE_OTHER): Payer: PRIVATE HEALTH INSURANCE

## 2018-10-13 ENCOUNTER — Ambulatory Visit (INDEPENDENT_AMBULATORY_CARE_PROVIDER_SITE_OTHER): Payer: PRIVATE HEALTH INSURANCE | Admitting: Orthopaedic Surgery

## 2018-10-13 DIAGNOSIS — Z96642 Presence of left artificial hip joint: Secondary | ICD-10-CM | POA: Diagnosis not present

## 2018-10-13 NOTE — Progress Notes (Signed)
The patient is a 62 year old female who is 8 months status post a left total hip arthroplasty.  She is doing well.  She says she feels a little bit aching with weather changes but otherwise has no issues with her hip at all.  She is now walking with any significant limp at all.  She denies any significant issues.  On exam I can move her left operative brought easily with no issues at all.  Her leg lengths are equal.  I stressed the hip and causes no pain.  An AP pelvis and lateral of her left hip shows a well-seated implant with no complicating features.  At this point she will follow-up as needed.  She will increase her activities as comfort allows.  All question concerns were answered and addressed.

## 2019-10-26 ENCOUNTER — Ambulatory Visit (INDEPENDENT_AMBULATORY_CARE_PROVIDER_SITE_OTHER): Payer: PRIVATE HEALTH INSURANCE

## 2019-10-26 ENCOUNTER — Other Ambulatory Visit: Payer: Self-pay

## 2019-10-26 ENCOUNTER — Ambulatory Visit (INDEPENDENT_AMBULATORY_CARE_PROVIDER_SITE_OTHER): Payer: PRIVATE HEALTH INSURANCE | Admitting: Physician Assistant

## 2019-10-26 ENCOUNTER — Encounter: Payer: Self-pay | Admitting: Physician Assistant

## 2019-10-26 DIAGNOSIS — M25511 Pain in right shoulder: Secondary | ICD-10-CM

## 2019-10-26 NOTE — Progress Notes (Signed)
Office Visit Note   Patient: Ellen Sawyer           Date of Birth: 1956/07/09           MRN: [id number] Visit Date: 10/26/2019              Requested by: Richmond Campbell., PA-C [address]  [place] [address] PCP: Richmond Campbell., PA-C   Assessment & Plan: Visit Diagnoses:  1. Acute pain of right shoulder     Plan: She will perform Codman, wall crawls, pendulum, and forward flexion exercises as shown today.  We will see her back in 2 weeks to see what type of response she had to the injection.  Questions encouraged and answered.  Follow-Up Instructions: Return in about 2 weeks (around 11/09/2019).   Orders:  Orders Placed This Encounter  Procedures  . XR Shoulder Right   No orders of the defined types were placed in this encounter.     Procedures: No procedures performed   Clinical Data: No additional findings.   Subjective: Chief Complaint  Patient presents with  . Right Shoulder - Pain    HPI Ellen Sawyer comes in today due to right shoulder pain.  She reports 3 weeks ago she was pulling some weeds and felt a pop in her right shoulder.  She has a history of a biceps tendon rupture years ago.  She is having decreased range of motion of her shoulder and stabbing pain in the shoulder.  She denies any neck pain.  Occasionally has some numbness in her fingers on the right side but this is rare.  Review of Systems Denies any fevers chills shortness of breath chest pain.  Please see HPI otherwise negative.  Objective: Vital Signs: There were no vitals taken for this visit.  Physical Exam Constitutional:      Appearance: She is normal weight. She is not ill-appearing or diaphoretic.  Pulmonary:     Effort: Pulmonary effort is normal.  Neurological:     Mental Status: She is oriented to person, place, and time.  Psychiatric:        Mood and Affect: Mood normal.     Ortho Exam Bilateral shoulder she has weakness with external rotation  of the right shoulder against resistance.  Otherwise 5/5 strength with internal and external rotation bilateral shoulders.  Positive impingement testing on the right.  Decreased forward flexion abduction of the right shoulder full range of motion left shoulder.  Unable to perform liftoff test due to pain in the right shoulder negative liftoff test on the left.  Empty can test is negative bilaterally. Specialty Comments:  No specialty comments available.  Imaging: Xr Shoulder Right  Result Date: 10/26/2019 Right shoulder: No acute fracture. Shoulder well located. Glenohumeral joint well maintained.  No acute findings.    PMFS History: Patient Active Problem List   Diagnosis Date Noted  . 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 Unilateral primary osteoarthritis, left hip 01/14/2018  . Spondylolisthesis at L4-L5 level 09/09/2017   Past Medical History:  Diagnosis Date  . Anxiety    DULoxetine (CYMBALTA)  . Asthma   . Chest discomfort    in the past  . Chest pain    in the past  . History of hiatal hernia   . Hypercholesterolemia   . Hypertriglyceridemia   . Osteoarthritis of left hip   . PONV (postoperative nausea and vomiting)   . Pre-diabetes  year ago was on metformin, then she has been of metforming for many years    Family History  Problem Relation Age of Onset  . Coronary artery disease Father        1970, several bypass  . Dementia Father   . Pancreatic cancer Mother     Past Surgical History:  Procedure Laterality Date  . CARDIAC CATHETERIZATION     2008  . [place]  . COLONOSCOPY    . feet surgery  1990   four  . herniated disc  1990 and 1992   two  . LUMBAR LAMINECTOMY     rods screws, cadiver bone   . TONSILLECTOMY    . TOOTH EXTRACTION    . TOTAL HIP ARTHROPLASTY Left 02/11/2018  . TOTAL HIP ARTHROPLASTY Left 02/11/2018   Procedure: LEFT TOTAL HIP ARTHROPLASTY ANTERIOR APPROACH;  Surgeon: Mcarthur Rossetti, MD;  Location: [facility];  Service: Orthopedics;  Laterality: Left;  . WISDOM TOOTH EXTRACTION     Social History   Occupational History  . Not on file  Tobacco Use  . Smoking status: Never Smoker  . Smokeless tobacco: Never Used  Substance and Sexual Activity  . Alcohol use: No  . Drug use: No  . Sexual activity: Not on file

## 2019-11-09 ENCOUNTER — Other Ambulatory Visit: Payer: Self-pay

## 2019-11-09 ENCOUNTER — Other Ambulatory Visit: Payer: Self-pay | Admitting: Radiology

## 2019-11-09 ENCOUNTER — Encounter: Payer: Self-pay | Admitting: Physician Assistant

## 2019-11-09 ENCOUNTER — Ambulatory Visit: Payer: PRIVATE HEALTH INSURANCE | Admitting: Physician Assistant

## 2019-11-09 DIAGNOSIS — M25511 Pain in right shoulder: Secondary | ICD-10-CM

## 2019-11-09 NOTE — Progress Notes (Signed)
Office Visit Note   Patient: Ellen Sawyer           Date of Birth: [birth year]           MRN: [id number] Visit Date: 11/09/2019              Requested by: Aletha Halim., PA-C [address],  [place] [address] PCP: Aletha Halim., PA-C   Assessment & Plan: Visit Diagnoses:  1. Acute pain of right shoulder     Plan: We will obtain an MRI of the right shoulder rule out rotator cuff tear.  Have her follow-up after the MRI to go over results and discuss further treatment.  Did ask her to bring a disc as this will be done through [facility].  Questions were encouraged and answered at length.  Follow-Up Instructions: Return After MRI.   Orders:  No orders of the defined types were placed in this encounter.  No orders of the defined types were placed in this encounter.     Procedures: No procedures performed   Clinical Data: No additional findings.   Subjective: Chief Complaint  Patient presents with  . Right Shoulder - Follow-up    HPI [name] returns today follow-up of her right shoulder.  She states the injection did not give her any relief.  She continues to have pain in the right shoulder with pain down into the biceps region.  She denies any numbness tingling down the arm.  She is taking Advil and taking a half a hydrocodone at night to sleep.  She is trying to do the home exercises have difficulty with range of motion of the right shoulder particularly overhead and abduction. Review of Systems Negative for fevers or chills. See HPI  Objective: Vital Signs: There were no vitals taken for this visit.  Physical Exam Constitutional:      Appearance: She is not ill-appearing or diaphoretic.  Pulmonary:     Effort: Pulmonary effort is normal.  Neurological:     Mental Status: She is alert and oriented to person, place, and time.  Psychiatric:        Mood and Affect: Mood normal.     Ortho Exam Forward flexion she is able to  ratchet the right arm up to approximately 130 degrees.  Abduction on the right actively to 90 degrees only.  Passively bring her to fully overhead with forward flexion however this painful.  Positive impingement testing on the right.  Out of 5 strength with external and internal rotation bilaterally.  Empty can test positive.  Unable to perform liftoff test due to pain and inability.  Patient going right arm off of back. Specialty Comments:  No specialty comments available.  Imaging: No results found.   PMFS History: Patient Active Problem List   Diagnosis Date Noted  . 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 Unilateral primary osteoarthritis, left hip 01/14/2018  . Spondylolisthesis at L4-L5 level 09/09/2017   Past Medical History:  Diagnosis Date  . Anxiety    DULoxetine (CYMBALTA)  . Asthma   . Chest discomfort    in the past  . Chest pain    in the past  . History of hiatal hernia   . Hypercholesterolemia   . Hypertriglyceridemia   . Osteoarthritis of left hip   . PONV (postoperative nausea and vomiting)   . Pre-diabetes    year ago was on metformin, then she has been of metforming for  many years    Family History  Problem Relation Age of Onset  . Coronary artery disease Father        1970, several bypass  . Dementia Father   . Pancreatic cancer Mother     Past Surgical History:  Procedure Laterality Date  . CARDIAC CATHETERIZATION     2008  . CESAREAN SECTION  1986 and 1989  . COLONOSCOPY    . feet surgery  1990   four  . herniated disc  1990 and 1992   two  . LUMBAR LAMINECTOMY     rods screws, cadiver bone   . TONSILLECTOMY    . TOOTH EXTRACTION    . TOTAL HIP ARTHROPLASTY Left 02/11/2018  . TOTAL HIP ARTHROPLASTY Left 02/11/2018   Procedure: LEFT TOTAL HIP ARTHROPLASTY ANTERIOR APPROACH;  Surgeon: Kathryne Hitch, MD;  Location: [facility] OR;  Service: Orthopedics;  Laterality: Left;  . WISDOM TOOTH EXTRACTION     Social History    Occupational History  . Not on file  Tobacco Use  . Smoking status: Never Smoker  . Smokeless tobacco: Never Used  Substance and Sexual Activity  . Alcohol use: No  . Drug use: No  . Sexual activity: Not on file

## 2019-11-25 ENCOUNTER — Telehealth: Payer: Self-pay | Admitting: *Deleted

## 2019-11-25 NOTE — Telephone Encounter (Signed)
Pt is scheduled at [place] [facility] on Mon Dec 28 at 4:30pm arrival for 4:45pm appt, I called and left message on pt vm to return my call for appt information

## 2019-11-25 NOTE — Telephone Encounter (Signed)
Pt returned call and left me vm stating it was ok to leave detailed message on her vm if she did not pick up about her appt. I left appt information and asked to rc if needed questions

## 2019-12-21 ENCOUNTER — Ambulatory Visit: Payer: PRIVATE HEALTH INSURANCE | Admitting: Physician Assistant

## 2019-12-21 ENCOUNTER — Encounter: Payer: Self-pay | Admitting: Physician Assistant

## 2019-12-21 ENCOUNTER — Other Ambulatory Visit: Payer: Self-pay

## 2019-12-21 DIAGNOSIS — M75121 Complete rotator cuff tear or rupture of right shoulder, not specified as traumatic: Secondary | ICD-10-CM | POA: Diagnosis not present

## 2019-12-21 NOTE — Addendum Note (Signed)
Addended by: Richardean Canal on: 12/21/2019 11:16 AM   Modules accepted: Level of Service

## 2019-12-21 NOTE — Progress Notes (Signed)
HPI: Ms. Lendell Caprice returns today to go over the MRI of her right shoulder.  She continues to have sharp stabbing pain in the shoulder that radiates down into the upper arm.  No numbness tingling.  She notes decreased range of motion of the shoulder.  Again her pain began in late October or early November of this year.  She reports history of biceps tendon rupture years ago right shoulder.  She is nondiabetic.  She is not a smoker.  MRI right shoulder dated 12/15/2019 showed a large rotator cuff tear involving the infraspinatus supraspinatus tendons with retraction measuring 5 x 6 cm approximately.  Muscle atrophy was noted of the supraspinatus infraspinatus and subscapularis muscles.  Glenohumeral joint with a moderate effusion.  Humeral head subluxed superiorly and articulates with the undersurface of the acromion.  Acromion type II.  Biceps long head is avulsed from its origin and distally retractors.  Humeral head with tiny marginal osteophyte off the inferior medial aspect.  Physical exam: Active forward flexion to approximately 120 degrees.  Passively I can bring him up to about 170 degrees.  She has weakness with external rotation of the right shoulder against resistance out of 5 strength both shoulders with internal rotation against resistance.  Impression: Right shoulder rotator cuff tear  Plan discussed with the patient recommend right shoulder arthroscopy with extensive debridement, subacromial decompression and possible rotator cuff repair.  I did discuss with her that the cuff may not be the able to repair due to the atrophy and large tear with retraction.  Discussed the postoperative protocol with her if the shoulder is able to be repaired versus nonrepairing of the cuff.  Risk benefits of surgery discussed at length.  We will see her back in 1 week postop.  Given Domenica Reamer card we will schedule this in the near future.

## 2020-01-28 ENCOUNTER — Inpatient Hospital Stay: Payer: PRIVATE HEALTH INSURANCE | Admitting: Orthopaedic Surgery

## 2020-02-04 ENCOUNTER — Other Ambulatory Visit: Payer: Self-pay | Admitting: Orthopaedic Surgery

## 2020-02-04 DIAGNOSIS — M75121 Complete rotator cuff tear or rupture of right shoulder, not specified as traumatic: Secondary | ICD-10-CM | POA: Diagnosis not present

## 2020-02-04 MED ORDER — HYDROCODONE-ACETAMINOPHEN 5-325 MG PO TABS: 1.0000 | ORAL_TABLET | Freq: Four times a day (QID) | ORAL | 0 refills | Status: DC | PRN

## 2020-02-04 MED ORDER — ONDANSETRON 4 MG PO TBDP: 4.0000 mg | ORAL_TABLET | Freq: Three times a day (TID) | ORAL | 0 refills | Status: DC | PRN

## 2020-02-04 MED ORDER — TIZANIDINE HCL 4 MG PO TABS: 4.0000 mg | ORAL_TABLET | Freq: Three times a day (TID) | ORAL | 1 refills | Status: DC | PRN

## 2020-02-11 ENCOUNTER — Ambulatory Visit (INDEPENDENT_AMBULATORY_CARE_PROVIDER_SITE_OTHER): Payer: PRIVATE HEALTH INSURANCE | Admitting: Physician Assistant

## 2020-02-11 ENCOUNTER — Encounter: Payer: Self-pay | Admitting: Physician Assistant

## 2020-02-11 ENCOUNTER — Other Ambulatory Visit: Payer: Self-pay

## 2020-02-11 DIAGNOSIS — Z9889 Other specified postprocedural states: Secondary | ICD-10-CM

## 2020-02-11 NOTE — Addendum Note (Signed)
Addended by: Mardene Celeste B on: 02/11/2020 04:29 PM   Modules accepted: Orders

## 2020-02-11 NOTE — Progress Notes (Signed)
HPI: [name] returns today 1 week status post right shoulder arthroscopy.  She is overall doing well if she is not wearing the sling as much does wear occasionally for comfort.  She states wearing a sling her pain is 6-7 out of 10 pain.  Right shoulder arthroscopy showed extensive tearing of her rotator cuff which was a repairable.  She underwent extensive debridement of the shoulder.  Physical exam: Right shoulder port sites well approximated with nylon sutures no signs of infection.  She has full flexion extension of the right elbow.  Limited motion and pain with attempted range of motion of the right shoulder.  Impression: 1 week status post right shoulder arthroscopy with extensive debridement.  Plan: We will send her to formal physical therapy to work on range of motion strengthening the shoulder.  See her back in 1 month.  Sutures removed.  Questions were encouraged and answered.  Arthroscopy pictures were reviewed with the patient today.

## 2020-02-22 ENCOUNTER — Encounter: Payer: Self-pay | Admitting: Physical Therapy

## 2020-02-22 ENCOUNTER — Other Ambulatory Visit: Payer: Self-pay

## 2020-02-22 ENCOUNTER — Ambulatory Visit: Payer: PRIVATE HEALTH INSURANCE | Admitting: Physical Therapy

## 2020-02-22 DIAGNOSIS — R600 Localized edema: Secondary | ICD-10-CM

## 2020-02-22 DIAGNOSIS — M25511 Pain in right shoulder: Secondary | ICD-10-CM

## 2020-02-22 DIAGNOSIS — M25611 Stiffness of right shoulder, not elsewhere classified: Secondary | ICD-10-CM | POA: Diagnosis not present

## 2020-02-22 DIAGNOSIS — M6281 Muscle weakness (generalized): Secondary | ICD-10-CM | POA: Diagnosis not present

## 2020-02-22 NOTE — Therapy (Signed)
[facility] [address] [place], [place], [address] Phone: [phone]   Fax:  [phone]  Physical Therapy Evaluation  Patient Details  Name: Ellen Sawyer MRN: [id number] Date of Birth: 10-Jan-1956 Referring Provider (PT): Dr. Magnus Ivan, MD, Richardean Canal PA-C   Encounter Date: 02/22/2020  PT End of Session - 02/22/20 1439    Visit Number  1    Number of Visits  16    Date for PT Re-Evaluation  04/22/20    PT Start Time  1445    PT Stop Time  1530    PT Time Calculation (min)  45 min    Activity Tolerance  Patient tolerated treatment well    Behavior During Therapy  [facility] for tasks assessed/performed       Past Medical History:  Diagnosis Date  . Anxiety    DULoxetine (CYMBALTA)  . Asthma   . Chest discomfort    in the past  . Chest pain    in the past  . History of hiatal hernia   . Hypercholesterolemia   . Hypertriglyceridemia   . Osteoarthritis of left hip   . PONV (postoperative nausea and vomiting)   . Pre-diabetes    year ago was on metformin, then she has been of metforming for many years    Past Surgical History:  Procedure Laterality Date  . CARDIAC CATHETERIZATION     2008  . CESAREAN SECTION  1986 and 1989  . COLONOSCOPY    . feet surgery  1990   four  . herniated disc  1990 and 1992   two  . LUMBAR LAMINECTOMY     rods screws, cadiver bone   . TONSILLECTOMY    . TOOTH EXTRACTION    . TOTAL HIP ARTHROPLASTY Left 02/11/2018  . TOTAL HIP ARTHROPLASTY Left 02/11/2018   Procedure: LEFT TOTAL HIP ARTHROPLASTY ANTERIOR APPROACH;  Surgeon: Kathryne Hitch, MD;  Location: [facility];  Service: Orthopedics;  Laterality: Left;  . WISDOM TOOTH EXTRACTION      There were no vitals filed for this visit.   Subjective Assessment - 02/22/20 1446    Subjective  Pt arriving to therapy repporting R shoulder pain that began about 2 months ago, Pt reported when she was pulling up limbs from her yard she heard her  shoulder "pop". Pt reporting she has tried injection and pt reporting no relief. Pt s/p R shoulder arthroscopy debridement.    Pertinent History  s/p R shouldre arthroscopy debridement on 02/04/2020    Diagnostic tests  MRI    Patient Stated Goals  I want to do my daily activities. 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  Yes    Pain Score  6     Pain Location  Shoulder    Pain Orientation  Right    Pain Descriptors / Indicators  Aching    Pain Type  Surgical pain    Pain Onset  1 to 4 weeks ago    Pain Frequency  Constant    Aggravating Factors   radiates to elbow and anterior bicep    Pain Relieving Factors  icing, over the counter pain meds    Effect of Pain on Daily Activities  on light duty at work, diffuculty donning shirts and bra, fixing her hair.         [facility] PT Assessment - 02/22/20 0001      Assessment   Medical Diagnosis  R shoulder arthroscopy M75.121    Referring Provider (PT)  Dr. Magnus Ivan, MD, Richardean Canal PA-C    Onset Date/Surgical Date  02/04/20    Hand Dominance  Right    Prior Therapy  no      Restrictions   Weight Bearing Restrictions  No      Balance Screen   Has the patient fallen in the past 6 months  No    Is the patient reluctant to leave their home because of a fear of falling?   No      Home Public house manager residence      Prior Function   Level of Independence  Independent    Vocation  Full time employment    Vocation Requirements  nurse      Cognition   Overall Cognitive Status  Within Functional Limits for tasks assessed      Posture/Postural Control   Posture/Postural Control  Postural limitations    Postural Limitations  Rounded Shoulders;Forward head      ROM / Strength   AROM / PROM / Strength  AROM;Strength      AROM   AROM Assessment Site  Shoulder    Right/Left Shoulder  Right;Left    Right Shoulder Extension  40 Degrees    Right Shoulder Flexion  94 Degrees     Right Shoulder ABduction  70 Degrees    Right Shoulder Internal Rotation  --   thumb to Sacrum   Right Shoulder External Rotation  45 Degrees    Left Shoulder Extension  65 Degrees    Left Shoulder Flexion  150 Degrees    Left Shoulder ABduction  160 Degrees    Left Shoulder Internal Rotation  --   thumb to T8   Left Shoulder External Rotation  70 Degrees      Strength   Strength Assessment Site  Shoulder      Ambulation/Gait   Gait Comments  decreased arm swing on R UE                Objective measurements completed on examination: See above findings.              PT Education - 02/22/20 1456    Education Details  PT POC, HEP    Person(s) Educated  Patient    Methods  Explanation;Demonstration;Other (comment)    Comprehension  Verbalized understanding;Returned demonstration          PT Long Term Goals - 02/22/20 1440      PT LONG TERM GOAL #1   Title  Pt will be independent in her HEP and progression.    Baseline  initial program issued on 02/22/2020    Time  8    Period  Weeks    Status  New      PT LONG TERM GOAL #2   Title  pt wil be able to imporve R shoulder flexion to >/= 140 degrees.    Time  8    Period  Weeks    Status  New      PT LONG TERM GOAL #3   Title  Pt will be able to improve R shoulder ER to >/= 60 degrees.    Time  8    Period  Weeks    Status  New      PT LONG TERM GOAL #4   Title  Pt will be albe to reporting ability to don/doff her shirts/bras with pain </=  2/10.    Baseline  increased pain reported    Time  8    Period  Weeks    Status  New             Plan - 02/22/20 1445    Clinical Impression Statement  Pt arriving to therapy reporting 6/10 pain in R shoulder s/p debridement on 02/04/2020. Pt also presenting with right rotator cuff tear, but Dr. Ninfa Linden reported that it wasn't operable per pt report. Pt with limited ROM and strength in R UE compared to L UE. Pt was issued a HEP and a tennis ball for  self soft tissue mobs. 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 Skilled PT needed to progress pt toward PLOF with the below interventions.    Personal Factors and Comorbidities  Comorbidity 3+    Comorbidities  anxiety, asthma, cardiac cath, lumbar laminectomy, foot surgery, C-section, hypercholesterolemia    Examination-Activity Limitations  Dressing;Reach Overhead;Carry    Examination-Participation Restrictions  Other;Cleaning;Meal Prep    Stability/Clinical Decision Making  Stable/Uncomplicated    Clinical Decision Making  Low    Rehab Potential  Good    PT Frequency  2x / week    PT Duration  8 weeks    PT Treatment/Interventions  ADLs/Self Care Home Management;Cryotherapy;Electrical Stimulation;Iontophoresis 4mg /ml Dexamethasone;Moist Heat;Ultrasound;Gait training;Functional mobility training;Therapeutic exercise;Therapeutic activities;Neuromuscular re-education;Patient/family education;Taping;Dry needling;Passive range of motion;Vasopneumatic Device    PT Next Visit Plan  Shoulder PROM, STM, modalities as needed    PT Home Exercise Plan  [email or web address]    Consulted and Agree with Plan of Care  Patient       Patient will benefit from skilled therapeutic intervention in order to improve the following deficits and impairments:  Pain, Postural dysfunction, Impaired flexibility, Decreased strength, Decreased range of motion  Visit Diagnosis: Acute pain of right shoulder  Stiffness of right shoulder, not elsewhere classified  Localized edema  Muscle weakness (generalized)     Problem List Patient Active Problem List   Diagnosis Date Noted  . 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 Unilateral primary osteoarthritis, left hip 01/14/2018  . Spondylolisthesis at L4-L5 level 09/09/2017    Oretha Caprice, MPT 02/22/2020, [facility] [address] [place], [place],  [address] Phone: [phone]   Fax:  [phone]  Name: NEDA WILLENBRING MRN: [id number] Date of Birth: [birth year]

## 2020-02-24 ENCOUNTER — Ambulatory Visit (INDEPENDENT_AMBULATORY_CARE_PROVIDER_SITE_OTHER): Payer: PRIVATE HEALTH INSURANCE | Admitting: Physical Therapy

## 2020-02-24 ENCOUNTER — Other Ambulatory Visit: Payer: Self-pay

## 2020-02-24 DIAGNOSIS — M25611 Stiffness of right shoulder, not elsewhere classified: Secondary | ICD-10-CM | POA: Diagnosis not present

## 2020-02-24 DIAGNOSIS — R600 Localized edema: Secondary | ICD-10-CM

## 2020-02-24 DIAGNOSIS — M25511 Pain in right shoulder: Secondary | ICD-10-CM

## 2020-02-24 DIAGNOSIS — M6281 Muscle weakness (generalized): Secondary | ICD-10-CM

## 2020-02-25 ENCOUNTER — Encounter: Payer: Self-pay | Admitting: Physical Therapy

## 2020-02-25 NOTE — Therapy (Signed)
[facility] [address] [place], [place], [address] Phone: [phone]   Fax:  [phone]  Physical Therapy Treatment  Patient Details  Name: Ellen Sawyer MRN: [id number] Date of Birth: Apr 07, 1956 Referring Provider (PT): Dr. Ninfa Linden, MD, Erskine Emery PA-C   Encounter Date: 02/24/2020  PT End of Session - 02/25/20 8768    Visit Number  2    Number of Visits  16    Date for PT Re-Evaluation  04/22/20    PT Start Time  1157    PT Stop Time  1353    PT Time Calculation (min)  40 min    Activity Tolerance  Patient tolerated treatment well    Behavior During Therapy  [facility] for tasks assessed/performed       Past Medical History:  Diagnosis Date  . Anxiety    DULoxetine (CYMBALTA)  . Asthma   . Chest discomfort    in the past  . Chest pain    in the past  . History of hiatal hernia   . Hypercholesterolemia   . Hypertriglyceridemia   . Osteoarthritis of left hip   . PONV (postoperative nausea and vomiting)   . Pre-diabetes    year ago was on metformin, then she has been of metforming for many years    Past Surgical History:  Procedure Laterality Date  . CARDIAC CATHETERIZATION     2008  . [place]  . COLONOSCOPY    . feet surgery  1990   four  . herniated disc  1990 and 1992   two  . LUMBAR LAMINECTOMY     rods screws, cadiver bone   . TONSILLECTOMY    . TOOTH EXTRACTION    . TOTAL HIP ARTHROPLASTY Left 02/11/2018  . TOTAL HIP ARTHROPLASTY Left 02/11/2018   Procedure: LEFT TOTAL HIP ARTHROPLASTY ANTERIOR APPROACH;  Surgeon: Mcarthur Rossetti, MD;  Location: [place];  Service: Orthopedics;  Laterality: Left;  . WISDOM TOOTH EXTRACTION      There were no vitals filed for this visit.  Subjective Assessment - 02/24/20 1314    Subjective  shoulder is sore today.  pain about 5-6/10.  almost fell coming into clinic so Lt hip is hurting.    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  Yes    Pain Score  6     Pain Location  Shoulder    Pain Orientation  Right    Pain Descriptors / Indicators  Aching;Sore    Pain Type  Surgical pain    Pain Onset  1 to 4 weeks ago    Pain Frequency  Constant    Aggravating Factors   radiated to elbow and ant bicep    Pain Relieving Factors  ice, OTC pain mets                       [facility] Adult PT Treatment/Exercise - 02/24/20 1316      Exercises   Exercises  Shoulder      Shoulder Exercises: Supine   Flexion  AAROM;20 reps   1# bar     Shoulder Exercises: Sidelying   External Rotation  Right;20 reps;Weights    External Rotation Weight (  lbs)  1    ABduction  Right;20 reps;Weights    ABduction Weight (lbs)  1      Shoulder Exercises: Standing   Internal Rotation  AAROM;20 reps;Left   1# bar   Extension  Both;20 reps;AAROM   1# bar   Extension Limitations  x20 reps with L1 band    Row  Both;20 reps;Theraband    Theraband Level (Shoulder Row)  Level 1 (Yellow)    Other Standing Exercises  flexion/abduction wall ladder x 10 reps    Other Standing Exercises  internal rotation/external rotation x 10 with L1 band - right      Shoulder Exercises: Pulleys   Flexion  2 minutes    Scaption  2 minutes      Modalities   Modalities  --   declined-will ice at home     Manual Therapy   Manual Therapy  Passive ROM    Passive ROM  Rt shoulder flexion, abduction, ir/er to tolerance - good motion throughout with tightness at end range                  PT Long Term Goals - 02/22/20 1440      PT LONG TERM GOAL #1   Title  Pt will be independent in her HEP and progression.    Baseline  initial program issued on 02/22/2020    Time  8    Period  Weeks    Status  New      PT LONG TERM GOAL #2   Title  pt wil be able to  imporve R shoulder flexion to >/= 140 degrees.    Time  8    Period  Weeks    Status  New      PT LONG TERM GOAL #3   Title  Pt will be able to improve R shoulder ER to >/= 60 degrees.    Time  8    Period  Weeks    Status  New      PT LONG TERM GOAL #4   Title  Pt will be albe to reporting ability to don/doff her shirts/bras with pain </= 2/10.    Baseline  increased pain reported    Time  8    Period  Weeks    Status  New            Plan - 02/25/20 1610    Clinical Impression Statement  Pt reports compliance with HEP, and will plan to review next visit.  Overall doing well with ROM, strength is limited with some pain at this time.  Is interested in decreasing freq to 1x/wk due to financial concerns so plan to add to HEP to give additional exercises for her to work on at home.  No goals met as only 2nd visit.    Personal Factors and Comorbidities  Comorbidity 3+    Comorbidities  anxiety, asthma, cardiac cath, lumbar laminectomy, foot surgery, C-section, hypercholesterolemia    Examination-Activity Limitations  Dressing;Reach Overhead;Carry    Examination-Participation Restrictions  Other;Cleaning;Meal Prep    Stability/Clinical Decision Making  Stable/Uncomplicated    Rehab Potential  Good    PT Frequency  2x / week    PT Duration  8 weeks    PT Treatment/Interventions  ADLs/Self Care Home Management;Cryotherapy;Electrical Stimulation;Iontophoresis 59m/ml Dexamethasone;Moist Heat;Ultrasound;Gait training;Functional mobility training;Therapeutic exercise;Therapeutic activities;Neuromuscular re-education;Patient/family education;Taping;Dry needling;Passive range of motion;Vasopneumatic Device    PT Next Visit Plan  continue with shoulder ROM, gentle strengthening, manual/modalities PRN  PT Home Exercise Plan  [email or web address]    Consulted and Agree with Plan of Care  Patient       Patient will benefit from skilled therapeutic intervention in order to improve the following deficits  and impairments:  Pain, Postural dysfunction, Impaired flexibility, Decreased strength, Decreased range of motion  Visit Diagnosis: Acute pain of right shoulder  Stiffness of right shoulder, not elsewhere classified  Localized edema  Muscle weakness (generalized)     Problem List Patient Active Problem List   Diagnosis Date Noted  . 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 Unilateral primary osteoarthritis, left hip 01/14/2018  . Spondylolisthesis at L4-L5 level 09/09/2017      Laureen Abrahams, PT, DPT 02/25/20 7:25 AM    [facility] [address] [place], [place], [address] Phone: [phone]   Fax:  [phone]  Name: Ellen Sawyer MRN: [id number] Date of Birth: 15-Mar-1956

## 2020-02-29 ENCOUNTER — Ambulatory Visit: Payer: PRIVATE HEALTH INSURANCE | Admitting: Physical Therapy

## 2020-02-29 ENCOUNTER — Other Ambulatory Visit: Payer: Self-pay

## 2020-02-29 ENCOUNTER — Encounter: Payer: Self-pay | Admitting: Physical Therapy

## 2020-02-29 DIAGNOSIS — M6281 Muscle weakness (generalized): Secondary | ICD-10-CM

## 2020-02-29 DIAGNOSIS — R600 Localized edema: Secondary | ICD-10-CM

## 2020-02-29 DIAGNOSIS — M25511 Pain in right shoulder: Secondary | ICD-10-CM | POA: Diagnosis not present

## 2020-02-29 DIAGNOSIS — M25611 Stiffness of right shoulder, not elsewhere classified: Secondary | ICD-10-CM

## 2020-02-29 NOTE — Therapy (Signed)
[facility] [address] [place], [place], [address] Phone: [phone]   Fax:  [phone]  Physical Therapy Treatment  Patient Details  Name: Ellen Sawyer MRN: [id number] Date of Birth: [birth year] Referring Provider (PT): Dr. Magnus Ivan, MD, Richardean Canal PA-C   Encounter Date: 02/29/2020  PT End of Session - 02/29/20 0842    Visit Number  3    Number of Visits  16    Date for PT Re-Evaluation  04/22/20    PT Start Time  0800    PT Stop Time  0840    PT Time Calculation (min)  40 min    Activity Tolerance  Patient tolerated treatment well    Behavior During Therapy  [facility] for tasks assessed/performed       Past Medical History:  Diagnosis Date  . Anxiety    DULoxetine (CYMBALTA)  . Asthma   . Chest discomfort    in the past  . Chest pain    in the past  . History of hiatal hernia   . Hypercholesterolemia   . Hypertriglyceridemia   . Osteoarthritis of left hip   . PONV (postoperative nausea and vomiting)   . Pre-diabetes    year ago was on metformin, then she has been of metforming for many years    Past Surgical History:  Procedure Laterality Date  . CARDIAC CATHETERIZATION     2008  . CESAREAN SECTION  1986 and 1989  . COLONOSCOPY    . feet surgery  1990   four  . herniated disc  1990 and 1992   two  . LUMBAR LAMINECTOMY     rods screws, cadiver bone   . TONSILLECTOMY    . TOOTH EXTRACTION    . TOTAL HIP ARTHROPLASTY Left 02/11/2018  . TOTAL HIP ARTHROPLASTY Left 02/11/2018   Procedure: LEFT TOTAL HIP ARTHROPLASTY ANTERIOR APPROACH;  Surgeon: Kathryne Hitch, MD;  Location: [facility] OR;  Service: Orthopedics;  Laterality: Left;  . WISDOM TOOTH EXTRACTION      There were no vitals filed for this visit.  Subjective Assessment - 02/29/20 0802    Subjective  had a family emergency over the weekend so hasn't been able to do exercises. 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  No/denies                       [facility] Adult PT Treatment/Exercise - 02/29/20 0803      Shoulder Exercises: Supine   Flexion  Right;AROM;10 reps      Shoulder Exercises: Sidelying   External Rotation  Right;20 reps;Weights    External Rotation Weight (lbs)  1    ABduction  Right;20 reps;Weights    ABduction Weight (lbs)  1      Shoulder Exercises: Standing   External Rotation  AAROM;20 reps;Right   1# bar   Internal Rotation  AAROM;20 reps;Left   1# bar   Flexion  AAROM;20 reps   1# bar   ABduction  Right;AAROM;20 reps   1# bar   Extension  Both;20 reps;AAROM   1# bar   Row  Both;20 reps;Theraband    Theraband Level (Shoulder Row)  Level 1 (Yellow)  Shoulder Exercises: Pulleys   Flexion  2 minutes    Scaption  2 minutes      Shoulder Exercises: ROM/Strengthening   Rhythmic Stabilization, Supine  Rt 1#; CW/CCW x 20 reps, A-Z      Manual Therapy   Manual Therapy  Passive ROM    Passive ROM  Rt shoulder flexion, abduction, ir/er to tolerance - good motion throughout with tightness at end range             PT Education - 02/29/20 0842    Education Details  HEP    Person(s) Educated  Patient    Methods  Explanation;Demonstration;Handout    Comprehension  Verbalized understanding;Returned demonstration;Need further instruction          PT Long Term Goals - 02/22/20 1440      PT LONG TERM GOAL #1   Title  Pt will be independent in her HEP and progression.    Baseline  initial program issued on 02/22/2020    Time  8    Period  Weeks    Status  New      PT LONG TERM GOAL #2   Title  pt wil be able to imporve R shoulder flexion to >/= 140 degrees.    Time  8    Period  Weeks    Status  New      PT LONG TERM GOAL #3   Title  Pt will be able to  improve R shoulder ER to >/= 60 degrees.    Time  8    Period  Weeks    Status  New      PT LONG TERM GOAL #4   Title  Pt will be albe to reporting ability to don/doff her shirts/bras with pain </= 2/10.    Baseline  increased pain reported    Time  8    Period  Weeks    Status  New            Plan - 02/29/20 8850    Clinical Impression Statement  Pt tolerated session well, and added to HEP as she would like to consider decreasing to 1x/wk after this week.  Will continue to benefit from PT to maximize function.    Personal Factors and Comorbidities  Comorbidity 3+    Comorbidities  anxiety, asthma, cardiac cath, lumbar laminectomy, foot surgery, C-section, hypercholesterolemia    Examination-Activity Limitations  Dressing;Reach Overhead;Carry    Examination-Participation Restrictions  Other;Cleaning;Meal Prep    Stability/Clinical Decision Making  Stable/Uncomplicated    Rehab Potential  Good    PT Frequency  2x / week    PT Duration  8 weeks    PT Treatment/Interventions  ADLs/Self Care Home Management;Cryotherapy;Electrical Stimulation;Iontophoresis 4mg /ml Dexamethasone;Moist Heat;Ultrasound;Gait training;Functional mobility training;Therapeutic exercise;Therapeutic activities;Neuromuscular re-education;Patient/family education;Taping;Dry needling;Passive range of motion;Vasopneumatic Device    PT Next Visit Plan  continue with shoulder ROM, gentle strengthening, manual/modalities PRN    PT Home Exercise Plan  [email or web address]    Consulted and Agree with Plan of Care  Patient       Patient will benefit from skilled therapeutic intervention in order to improve the following deficits and impairments:  Pain, Postural dysfunction, Impaired flexibility, Decreased strength, Decreased range of motion  Visit Diagnosis: Acute pain of right shoulder  Stiffness of right shoulder, not elsewhere classified  Localized edema  Muscle weakness (generalized)     Problem List Patient Active  Problem List   Diagnosis Date Noted  . Status post total replacement  of left hip 02/11/2018  . Unilateral primary osteoarthritis, left hip 01/14/2018  . Spondylolisthesis at L4-L5 level 09/09/2017      Ellen Sawyer, PT, DPT 02/29/20 8:44 AM    [facility] [address] [place], [place], [address] Phone: [phone]   Fax:  [phone]  Name: Ellen Sawyer MRN: [id number] Date of Birth: Jul 19, 1956

## 2020-02-29 NOTE — Patient Instructions (Signed)
Access Code: [email or web address] URL: [email or web address] Date: 02/29/2020 Prepared by: Moshe Cipro  Exercises Seated Shoulder Flexion Towel Slide at Table Top - 2 x daily - 7 x weekly - 10 reps - 3-5 hold Seated Shoulder Scaption Slide at Table Top with Forearm in Neutral - 2 x daily - 7 x weekly - 10 reps - 3-5 hold Circular Shoulder Pendulum with Table Support - 2 x daily - 7 x weekly Flexion-Extension Shoulder Pendulum with Table Support - 2 x daily - 7 x weekly - 1 reps Isometric Shoulder Flexion at Wall - 2 x daily - 7 x weekly - 5 reps - 5 seconds hold Isometric Shoulder Extension at Wall - 2 x daily - 7 x weekly - 5 reps - 5 seconds hold Isometric Shoulder External Rotation at Wall - 2 x daily - 7 x weekly - 5 reps - 5 seconds hold Standing Isometric Shoulder Internal Rotation at Doorway - 2 x daily - 7 x weekly - 5 reps - 5 seconds hold Standing Shoulder Flexion AAROM with Dowel - 1 x daily - 7 x weekly - 2 sets - 10 reps Shoulder Scaption AAROM with Dowel - 1 x daily - 7 x weekly - 2 sets - 10 reps Standing Shoulder Extension with Dowel - 1 x daily - 7 x weekly - 2 sets - 10 reps Standing Bilateral Shoulder Internal Rotation AAROM with Dowel - 1 x daily - 7 x weekly - 2 sets - 10 reps Standing Shoulder Internal Rotation AAROM with Dowel - 1 x daily - 7 x weekly - 2 sets - 10 reps Standing Shoulder External Rotation AAROM with Dowel - 1 x daily - 7 x weekly - 2 sets - 10 reps

## 2020-03-02 ENCOUNTER — Ambulatory Visit: Payer: PRIVATE HEALTH INSURANCE | Admitting: Physical Therapy

## 2020-03-02 ENCOUNTER — Other Ambulatory Visit: Payer: Self-pay

## 2020-03-02 ENCOUNTER — Encounter: Payer: Self-pay | Admitting: Physical Therapy

## 2020-03-02 DIAGNOSIS — R600 Localized edema: Secondary | ICD-10-CM | POA: Diagnosis not present

## 2020-03-02 DIAGNOSIS — M25511 Pain in right shoulder: Secondary | ICD-10-CM | POA: Diagnosis not present

## 2020-03-02 DIAGNOSIS — M6281 Muscle weakness (generalized): Secondary | ICD-10-CM

## 2020-03-02 DIAGNOSIS — M25611 Stiffness of right shoulder, not elsewhere classified: Secondary | ICD-10-CM

## 2020-03-02 NOTE — Therapy (Signed)
[facility] Physical Therapy [address] [place], [place], [address] Phone: [phone]   Fax:  [phone]  Physical Therapy Treatment  Patient Details  Name: Ellen Sawyer MRN: [id number] Date of Birth: [birth year] Referring Provider (PT): Dr. Magnus Ivan, MD, Richardean Canal PA-C   Encounter Date: 03/02/2020  PT End of Session - 03/02/20 0923    Visit Number  4    Number of Visits  16    Date for PT Re-Evaluation  04/22/20    PT Start Time  0843    PT Stop Time  0922    PT Time Calculation (min)  39 min    Activity Tolerance  Patient tolerated treatment well    Behavior During Therapy  [facility] for tasks assessed/performed       Past Medical History:  Diagnosis Date  . Anxiety    DULoxetine (CYMBALTA)  . Asthma   . Chest discomfort    in the past  . Chest pain    in the past  . History of hiatal hernia   . Hypercholesterolemia   . Hypertriglyceridemia   . Osteoarthritis of left hip   . PONV (postoperative nausea and vomiting)   . Pre-diabetes    year ago was on metformin, then she has been of metforming for many years    Past Surgical History:  Procedure Laterality Date  . CARDIAC CATHETERIZATION     2008  . CESAREAN SECTION  1986 and 1989  . COLONOSCOPY    . feet surgery  1990   four  . herniated disc  1990 and 1992   two  . LUMBAR LAMINECTOMY     rods screws, cadiver bone   . TONSILLECTOMY    . TOOTH EXTRACTION    . TOTAL HIP ARTHROPLASTY Left 02/11/2018  . TOTAL HIP ARTHROPLASTY Left 02/11/2018   Procedure: LEFT TOTAL HIP ARTHROPLASTY ANTERIOR APPROACH;  Surgeon: Kathryne Hitch, MD;  Location: [facility];  Service: Orthopedics;  Laterality: Left;  . WISDOM TOOTH EXTRACTION      There were no vitals filed for this visit.  Subjective Assessment - 03/02/20 0844    Subjective  shoulder is really sore today.  did exercises last night.    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  No/denies   c/o soreness, no pain                      [facility] Adult PT Treatment/Exercise - 03/02/20 0845      Shoulder Exercises: Standing   Extension  Both;20 reps;AAROM    Theraband Level (Shoulder Extension)  Level 2 (Red)    Row  Both;20 reps;Theraband    Theraband Level (Shoulder Row)  Level 2 (Red)    Other Standing Exercises  cone from counter to 1st shelf height 2x10; no weight      Shoulder Exercises: Pulleys   Flexion  2 minutes    Scaption  2 minutes      Shoulder Exercises: Therapy Ball   Flexion  20 reps;Both    Flexion Limitations  AA/seated    Scaption  Right;20 reps    Scaption Limitations  AA, seated    Right/Left  20 reps    Right/Left Limitations  AA/ seated  Shoulder Exercises: ROM/Strengthening   UBE (Upper Arm Bike)  L1.0 x 4 min (alt forward/backwards each minute)    [place]  flexion, scaption, circles CW/CCW x  20 reps each    Plank Limitations  plank position at counter - cues for posture: lateral weight shift x 20 reps with 3 sec hold                  PT Long Term Goals - 02/22/20 1440      PT LONG TERM GOAL #1   Title  Pt will be independent in her HEP and progression.    Baseline  initial program issued on 02/22/2020    Time  8    Period  Weeks    Status  New      PT LONG TERM GOAL #2   Title  pt wil be able to imporve R shoulder flexion to >/= 140 degrees.    Time  8    Period  Weeks    Status  New      PT LONG TERM GOAL #3   Title  Pt will be able to improve R shoulder ER to >/= 60 degrees.    Time  8    Period  Weeks    Status  New      PT LONG TERM GOAL #4   Title  Pt will be albe to reporting ability to don/doff her shirts/bras with pain </= 2/10.    Baseline  increased pain reported    Time  8    Period  Weeks    Status  New            Plan - 03/02/20  7062    Clinical Impression Statement  Increased soreness today which is to be expected with increasing activity with Rt shoulder.  Progressing well with PT at this time.    Personal Factors and Comorbidities  Comorbidity 3+    Comorbidities  anxiety, asthma, cardiac cath, lumbar laminectomy, foot surgery, C-section, hypercholesterolemia    Examination-Activity Limitations  Dressing;Reach Overhead;Carry    Examination-Participation Restrictions  Other;Cleaning;Meal Prep    Stability/Clinical Decision Making  Stable/Uncomplicated    Rehab Potential  Good    PT Frequency  2x / week    PT Duration  8 weeks    PT Treatment/Interventions  ADLs/Self Care Home Management;Cryotherapy;Electrical Stimulation;Iontophoresis 4mg /ml Dexamethasone;Moist Heat;Ultrasound;Gait training;Functional mobility training;Therapeutic exercise;Therapeutic activities;Neuromuscular re-education;Patient/family education;Taping;Dry needling;Passive range of motion;Vasopneumatic Device    PT Next Visit Plan  continue with shoulder ROM, gentle strengthening, manual/modalities PRN    PT Home Exercise Plan  [email or web address]    Consulted and Agree with Plan of Care  Patient       Patient will benefit from skilled therapeutic intervention in order to improve the following deficits and impairments:  Pain, Postural dysfunction, Impaired flexibility, Decreased strength, Decreased range of motion  Visit Diagnosis: Acute pain of right shoulder  Stiffness of right shoulder, not elsewhere classified  Localized edema  Muscle weakness (generalized)     Problem List Patient Active Problem List   Diagnosis Date Noted  . 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 Unilateral primary osteoarthritis, left hip 01/14/2018  . Spondylolisthesis at L4-L5 level 09/09/2017        Laureen Abrahams, PT, DPT 03/02/20 9:24 AM     [facility] Physical Therapy [address] [place], [place],  [address] Phone: [phone]   Fax:  [phone]  Name: Ellen Sawyer MRN: [id number] Date  of Birth: [birth year]

## 2020-03-09 ENCOUNTER — Encounter: Payer: PRIVATE HEALTH INSURANCE | Admitting: Physical Therapy

## 2020-03-14 ENCOUNTER — Ambulatory Visit: Payer: PRIVATE HEALTH INSURANCE | Admitting: Physical Therapy

## 2020-03-14 ENCOUNTER — Encounter: Payer: Self-pay | Admitting: Physical Therapy

## 2020-03-14 ENCOUNTER — Other Ambulatory Visit: Payer: Self-pay

## 2020-03-14 DIAGNOSIS — M25611 Stiffness of right shoulder, not elsewhere classified: Secondary | ICD-10-CM | POA: Diagnosis not present

## 2020-03-14 DIAGNOSIS — M25511 Pain in right shoulder: Secondary | ICD-10-CM | POA: Diagnosis not present

## 2020-03-14 DIAGNOSIS — M6281 Muscle weakness (generalized): Secondary | ICD-10-CM

## 2020-03-14 DIAGNOSIS — R600 Localized edema: Secondary | ICD-10-CM

## 2020-03-14 NOTE — Therapy (Signed)
[facility] Physical Therapy [address] [place], [place], [address] Phone: [phone]   Fax:  [phone]  Physical Therapy Treatment  Patient Details  Name: Ellen Sawyer MRN: [id number] Date of Birth: 15-Jul-1956 Referring Provider (PT): Dr. Ninfa Linden, MD, Erskine Emery PA-C   Encounter Date: 03/14/2020  PT End of Session - 03/14/20 0915    Visit Number  5    Number of Visits  16    Date for PT Re-Evaluation  04/22/20    PT Start Time  0845    PT Stop Time  0915    PT Time Calculation (min)  30 min    Activity Tolerance  Patient tolerated treatment well    Behavior During Therapy  [facility] for tasks assessed/performed       Past Medical History:  Diagnosis Date  . Anxiety    DULoxetine (CYMBALTA)  . Asthma   . Chest discomfort    in the past  . Chest pain    in the past  . History of hiatal hernia   . Hypercholesterolemia   . Hypertriglyceridemia   . Osteoarthritis of left hip   . PONV (postoperative nausea and vomiting)   . Pre-diabetes    year ago was on metformin, then she has been of metforming for many years    Past Surgical History:  Procedure Laterality Date  . CARDIAC CATHETERIZATION     2008  . [place]  . COLONOSCOPY    . feet surgery  1990   four  . herniated disc  1990 and 1992   two  . LUMBAR LAMINECTOMY     rods screws, cadiver bone   . TONSILLECTOMY    . TOOTH EXTRACTION    . TOTAL HIP ARTHROPLASTY Left 02/11/2018  . TOTAL HIP ARTHROPLASTY Left 02/11/2018   Procedure: LEFT TOTAL HIP ARTHROPLASTY ANTERIOR APPROACH;  Surgeon: Mcarthur Rossetti, MD;  Location: [place];  Service: Orthopedics;  Laterality: Left;  . WISDOM TOOTH EXTRACTION      There were no vitals filed for this visit.  Subjective Assessment - 03/14/20 0848    Subjective  doing well-wants today to be her last day, feels she's in a good spot with her home exercises.    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  Yes    Pain Score  5     Pain Location  Shoulder    Pain Orientation  Right    Pain Descriptors / Indicators  Aching;Sore    Pain Type  Surgical pain    Pain Onset  More than a month ago    Pain Frequency  Intermittent    Aggravating Factors   radiated to elbow and bicep    Pain Relieving Factors  ice, OTC pain meds         [facility] PT Assessment - 03/14/20 0853      Assessment   Medical Diagnosis  R shoulder arthroscopy M75.121    Referring Provider (PT)  Dr. Ninfa Linden, MD, Erskine Emery PA-C      AROM   Right Shoulder Flexion  120 Degrees   standing   Right Shoulder ABduction  72 Degrees   95 scaption   Right Shoulder Internal Rotation  --  hand to sacrum   Right Shoulder External Rotation  60 Degrees   hand to back of head                  [facility] Adult PT Treatment/Exercise - 03/14/20 0001      Shoulder Exercises: Standing   External Rotation  Right;20 reps;Theraband    Theraband Level (Shoulder External Rotation)  Level 1 (Yellow)    Internal Rotation  Right;20 reps;Theraband    Theraband Level (Shoulder Internal Rotation)  Level 3 (Green)    Flexion  Right;20 reps;Weights    Shoulder Flexion Weight (lbs)  1/2#    ABduction  Right;20 reps;Weights    Shoulder ABduction Weight (lbs)  1/2#    ABduction Limitations  scaption    Row  Both;20 reps;Theraband    Theraband Level (Shoulder Row)  Level 3 Nyoka Cowden)             PT Education - 03/14/20 0915    Education Details  strengthening HEP    Person(s) Educated  Patient    Methods  Explanation;Demonstration;Handout    Comprehension  Verbalized understanding;Returned demonstration;Need further instruction          PT Long Term Goals - 03/14/20 0915      PT LONG TERM GOAL #1   Title  Pt will be independent in her HEP  and progression.    Baseline  updated 4/12    Time  8    Period  Weeks    Status  On-going    Target Date  04/22/20      PT LONG TERM GOAL #2   Title  pt wil be able to imporve R shoulder flexion to >/= 140 degrees.    Baseline  4/12: 120    Time  8    Period  Weeks    Status  On-going      PT LONG TERM GOAL #3   Title  Pt will be able to improve R shoulder ER to >/= 60 degrees.    Time  8    Period  Weeks    Status  Achieved      PT LONG TERM GOAL #4   Title  Pt will be albe to reporting ability to don/doff her shirts/bras with pain </= 2/10.    Baseline  4/12:1/10    Time  8    Period  Weeks    Status  Achieved            Plan - 03/14/20 0916    Clinical Impression Statement  Pt initially reporting she wanted today to be her last visit so LTGs assessed today, and she's met 2 LTGs to date.  [name] demonstrated progress towards other goals, just not met yet.  With further discussion recommend working at home on strengthening and ROM HEP and will follow up with pt in 3 weeks, and she will call if she needs to be seen sooner.    Personal Factors and Comorbidities  Comorbidity 3+    Comorbidities  anxiety, asthma, cardiac cath, lumbar laminectomy, foot surgery, C-section, hypercholesterolemia    Examination-Activity Limitations  Dressing;Reach Overhead;Carry    Examination-Participation Restrictions  Other;Cleaning;Meal Prep    Stability/Clinical Decision Making  Stable/Uncomplicated    Rehab Potential  Good    PT Frequency  2x / week    PT Duration  8 weeks    PT Treatment/Interventions  ADLs/Self Care Home Management;Cryotherapy;Electrical Stimulation;Iontophoresis 85m/ml Dexamethasone;Moist Heat;Ultrasound;Gait training;Functional mobility training;Therapeutic exercise;Therapeutic activities;Neuromuscular re-education;Patient/family education;Taping;Dry needling;Passive  range of motion;Vasopneumatic Device    PT Next Visit Plan  follow up in 3 weeks - see how home program  is going    PT Home Exercise Plan  [email or web address]    Consulted and Agree with Plan of Care  Patient       Patient will benefit from skilled therapeutic intervention in order to improve the following deficits and impairments:  Pain, Postural dysfunction, Impaired flexibility, Decreased strength, Decreased range of motion  Visit Diagnosis: Acute pain of right shoulder  Stiffness of right shoulder, not elsewhere classified  Localized edema  Muscle weakness (generalized)     Problem List Patient Active Problem List   Diagnosis Date Noted  . 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 Unilateral primary osteoarthritis, left hip 01/14/2018  . Spondylolisthesis at L4-L5 level 09/09/2017      Laureen Abrahams, PT, DPT 03/14/20 9:19 AM    [facility] Physical Therapy [address] [place], [place], [address] Phone: [phone]   Fax:  [phone]  Name: Ellen Sawyer MRN: [id number] Date of Birth: 27-Mar-1956

## 2020-03-14 NOTE — Patient Instructions (Signed)
Access Code: [email or web address] URL: [email or web address] Date: 03/14/2020 Prepared by: Moshe Cipro  Exercises Seated Shoulder Flexion Towel Slide at Table Top - 2 x daily - 7 x weekly - 10 reps - 3-5 hold Seated Shoulder Scaption Slide at Table Top with Forearm in Neutral - 2 x daily - 7 x weekly - 10 reps - 3-5 hold Isometric Shoulder Flexion at Wall - 2 x daily - 7 x weekly - 5 reps - 5 seconds hold Isometric Shoulder Extension at Wall - 2 x daily - 7 x weekly - 5 reps - 5 seconds hold Isometric Shoulder External Rotation at Wall - 2 x daily - 7 x weekly - 5 reps - 5 seconds hold Standing Isometric Shoulder Internal Rotation at Doorway - 2 x daily - 7 x weekly - 5 reps - 5 seconds hold Standing Shoulder Flexion AAROM with Dowel - 1 x daily - 7 x weekly - 2 sets - 10 reps Shoulder Scaption AAROM with Dowel - 1 x daily - 7 x weekly - 2 sets - 10 reps Standing Shoulder Extension with Dowel - 1 x daily - 7 x weekly - 2 sets - 10 reps Standing Bilateral Shoulder Internal Rotation AAROM with Dowel - 1 x daily - 7 x weekly - 2 sets - 10 reps Standing Shoulder Internal Rotation AAROM with Dowel - 1 x daily - 7 x weekly - 2 sets - 10 reps Standing Shoulder External Rotation AAROM with Dowel - 1 x daily - 7 x weekly - 2 sets - 10 reps Standing Single Arm Shoulder Flexion with Dumbbell - 1 x daily - 7 x weekly - 2 sets - 10 reps Single Arm Scaption with Dumbbell - 1 x daily - 7 x weekly - 2 sets - 10 reps Shoulder External Rotation with Anchored Resistance - 1 x daily - 7 x weekly - 2 sets - 10 reps Standing Shoulder Internal Rotation with Anchored Resistance - 1 x daily - 7 x weekly - 2 sets - 10 reps Standing Row with Anchored Resistance - 1 x daily - 7 x weekly - 2 sets - 10 reps

## 2020-03-16 ENCOUNTER — Encounter: Payer: PRIVATE HEALTH INSURANCE | Admitting: Physical Therapy

## 2020-03-21 ENCOUNTER — Encounter: Payer: PRIVATE HEALTH INSURANCE | Admitting: Physical Therapy

## 2020-04-04 ENCOUNTER — Encounter: Payer: Self-pay | Admitting: Physical Therapy

## 2020-04-04 ENCOUNTER — Ambulatory Visit: Payer: PRIVATE HEALTH INSURANCE | Admitting: Physical Therapy

## 2020-04-04 ENCOUNTER — Other Ambulatory Visit: Payer: Self-pay

## 2020-04-04 DIAGNOSIS — R600 Localized edema: Secondary | ICD-10-CM

## 2020-04-04 DIAGNOSIS — M25611 Stiffness of right shoulder, not elsewhere classified: Secondary | ICD-10-CM

## 2020-04-04 DIAGNOSIS — M6281 Muscle weakness (generalized): Secondary | ICD-10-CM | POA: Diagnosis not present

## 2020-04-04 DIAGNOSIS — M25511 Pain in right shoulder: Secondary | ICD-10-CM

## 2020-04-04 NOTE — Patient Instructions (Signed)
Access Code: [email or web address] URL: [email or web address] Date: 04/04/2020 Prepared by: Moshe Cipro  Exercises Seated Shoulder Scaption Slide at Table Top with Forearm in Neutral - 2 x daily - 7 x weekly - 10 reps - 3-5 hold Standing Shoulder Flexion AAROM with Dowel - 1 x daily - 7 x weekly - 2 sets - 10 reps Shoulder Scaption AAROM with Dowel - 1 x daily - 7 x weekly - 2 sets - 10 reps Standing Shoulder Extension with Dowel - 1 x daily - 7 x weekly - 2 sets - 10 reps Standing Bilateral Shoulder Internal Rotation AAROM with Dowel - 1 x daily - 7 x weekly - 2 sets - 10 reps Standing Shoulder Internal Rotation AAROM with Dowel - 1 x daily - 7 x weekly - 2 sets - 10 reps Standing Shoulder External Rotation AAROM with Dowel - 1 x daily - 7 x weekly - 2 sets - 10 reps Standing Single Arm Shoulder Flexion with Dumbbell - 1 x daily - 7 x weekly - 2 sets - 10 reps Single Arm Scaption with Dumbbell - 1 x daily - 7 x weekly - 2 sets - 10 reps Shoulder External Rotation with Anchored Resistance - 1 x daily - 7 x weekly - 2 sets - 10 reps Standing Shoulder Internal Rotation with Anchored Resistance - 1 x daily - 7 x weekly - 2 sets - 10 reps Standing Row with Anchored Resistance - 1 x daily - 7 x weekly - 2 sets - 10 reps

## 2020-04-04 NOTE — Therapy (Signed)
[facility] Physical Therapy [address] [place], [place], [address] Phone: [phone]   Fax:  [phone]  Physical Therapy Treatment/Discharge Summary  Patient Details  Name: Ellen Sawyer MRN: [id number] Date of Birth: 28-Mar-1956 Referring Provider (PT): Dr. Ninfa Linden, MD, Erskine Emery PA-C   Encounter Date: 04/04/2020  PT End of Session - 04/04/20 1003    Visit Number  6    Number of Visits  16    PT Start Time  0930    PT Stop Time  0955    PT Time Calculation (min)  25 min    Activity Tolerance  Patient tolerated treatment well    Behavior During Therapy  [facility] for tasks assessed/performed       Past Medical History:  Diagnosis Date  . Anxiety    DULoxetine (CYMBALTA)  . Asthma   . Chest discomfort    in the past  . Chest pain    in the past  . History of hiatal hernia   . Hypercholesterolemia   . Hypertriglyceridemia   . Osteoarthritis of left hip   . PONV (postoperative nausea and vomiting)   . Pre-diabetes    year ago was on metformin, then she has been of metforming for many years    Past Surgical History:  Procedure Laterality Date  . CARDIAC CATHETERIZATION     2008  . [place]  . COLONOSCOPY    . feet surgery  1990   four  . herniated disc  1990 and 1992   two  . LUMBAR LAMINECTOMY     rods screws, cadiver bone   . TONSILLECTOMY    . TOOTH EXTRACTION    . TOTAL HIP ARTHROPLASTY Left 02/11/2018  . TOTAL HIP ARTHROPLASTY Left 02/11/2018   Procedure: LEFT TOTAL HIP ARTHROPLASTY ANTERIOR APPROACH;  Surgeon: Mcarthur Rossetti, MD;  Location: [place];  Service: Orthopedics;  Laterality: Left;  . WISDOM TOOTH EXTRACTION      There were no vitals filed for this visit.  Subjective Assessment - 04/04/20 0932    Subjective  shoulder feels about the same, doing exercises and feels like overall she's doing well.    Pertinent History  s/p R shouldre arthroscopy debridement on 02/04/2020    Diagnostic  tests  MRI    Patient Stated Goals  I want to do my daily activities. 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  Yes    Pain Score  6     Pain Location  Shoulder    Pain Orientation  Right    Pain Descriptors / Indicators  Aching;Sore    Pain Type  Surgical pain    Pain Onset  More than a month ago    Pain Frequency  Intermittent    Aggravating Factors   movement, constant    Pain Relieving Factors  ice, OTC medication         [facility] PT Assessment - 04/04/20 0937      Assessment   Medical Diagnosis  R shoulder arthroscopy M75.121    Referring Provider (PT)  Dr. Ninfa Linden, MD, Erskine Emery PA-C    Onset Date/Surgical Date  02/04/20      AROM   Right Shoulder Flexion  146 Degrees    Right Shoulder External Rotation  63 Degrees                   [facility] Adult PT Treatment/Exercise -  04/04/20 0933      Shoulder Exercises: Standing   Internal Rotation  AAROM;10 reps   3# bar   Flexion  AAROM;Both;10 reps   3# bar   ABduction  Right;AAROM;10 reps   3# bar; scaption   Other Standing Exercises  IR/ER with red theraband x 10 reps on Rt      Shoulder Exercises: ROM/Strengthening   UBE (Upper Arm Bike)  L2.5 x 6 min (3' each direction)             PT Education - 04/04/20 1003    Education Details  updated HEP - added weight to AA exercises    Person(s) Educated  Patient    Methods  Explanation;Demonstration;Handout    Comprehension  Verbalized understanding;Returned demonstration;Need further instruction          PT Long Term Goals - 04/04/20 1004      PT LONG TERM GOAL #1   Title  Pt will be independent in her HEP and progression.    Time  8    Period  Weeks    Status  Achieved      PT LONG TERM GOAL #2   Title  pt wil be able to imporve R shoulder flexion to >/= 140 degrees.    Baseline  4/12: 120    Time  8    Period  Weeks    Status  Achieved      PT LONG TERM GOAL #3   Title  Pt will be able to improve R  shoulder ER to >/= 60 degrees.    Time  8    Period  Weeks    Status  Achieved      PT LONG TERM GOAL #4   Title  Pt will be albe to reporting ability to don/doff her shirts/bras with pain </= 2/10.    Baseline  4/12:1/10    Time  8    Period  Weeks    Status  Achieved            Plan - 04/04/20 1004    Clinical Impression Statement  Pt has met all goals and feels ready for d/c today.  She still has some limitations with ROM, strength and continued pain which is expected with her surgery and recovery.  Goals for pt are to maximize strength available.  Plans to continue with HEP.    Personal Factors and Comorbidities  Comorbidity 3+    Comorbidities  anxiety, asthma, cardiac cath, lumbar laminectomy, foot surgery, C-section, hypercholesterolemia    Examination-Activity Limitations  Dressing;Reach Overhead;Carry    Examination-Participation Restrictions  Other;Cleaning;Meal Prep    Stability/Clinical Decision Making  Stable/Uncomplicated    Rehab Potential  Good    PT Frequency  2x / week    PT Duration  8 weeks    PT Treatment/Interventions  ADLs/Self Care Home Management;Cryotherapy;Electrical Stimulation;Iontophoresis 48m/ml Dexamethasone;Moist Heat;Ultrasound;Gait training;Functional mobility training;Therapeutic exercise;Therapeutic activities;Neuromuscular re-education;Patient/family education;Taping;Dry needling;Passive range of motion;Vasopneumatic Device    PT Next Visit Plan  d/c PT today    PT Home Exercise Plan  [email or web address]    Consulted and Agree with Plan of Care  Patient       Patient will benefit from skilled therapeutic intervention in order to improve the following deficits and impairments:  Pain, Postural dysfunction, Impaired flexibility, Decreased strength, Decreased range of motion  Visit Diagnosis: Acute pain of right shoulder  Stiffness of right shoulder, not elsewhere classified  Localized edema  Muscle weakness (generalized)  Problem  List Patient Active Problem List   Diagnosis Date Noted  . 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 Unilateral primary osteoarthritis, left hip 01/14/2018  . Spondylolisthesis at L4-L5 level 09/09/2017      Laureen Abrahams, PT, DPT 04/04/20 10:07 AM    [facility] Physical Therapy [address] [place], [place], [address] Phone: [phone]   Fax:  [phone]  Name: JASMINE MCBETH MRN: [id number] Date of Birth: September 01, 1956      PHYSICAL THERAPY DISCHARGE SUMMARY  Visits from Start of Care: 6  Current functional level related to goals / functional outcomes: See above   Remaining deficits: See above   Education / Equipment: HEP  Plan: Patient agrees to discharge.  Patient goals were met. Patient is being discharged due to meeting the stated rehab goals.  ?????    Laureen Abrahams, PT, DPT 04/04/20 10:07 AM   [facility] Physical Therapy [address] [place], [place], [address] Phone: [phone]   Fax:  [phone]

## 2021-01-04 ENCOUNTER — Other Ambulatory Visit: Payer: Self-pay | Admitting: Physician Assistant

## 2021-01-04 DIAGNOSIS — M25511 Pain in right shoulder: Secondary | ICD-10-CM

## 2021-03-13 ENCOUNTER — Ambulatory Visit (INDEPENDENT_AMBULATORY_CARE_PROVIDER_SITE_OTHER): Payer: PRIVATE HEALTH INSURANCE

## 2021-03-13 ENCOUNTER — Encounter: Payer: Self-pay | Admitting: Physician Assistant

## 2021-03-13 ENCOUNTER — Ambulatory Visit: Payer: PRIVATE HEALTH INSURANCE | Admitting: Physician Assistant

## 2021-03-13 ENCOUNTER — Ambulatory Visit: Payer: Self-pay

## 2021-03-13 DIAGNOSIS — M7742 Metatarsalgia, left foot: Secondary | ICD-10-CM

## 2021-03-13 DIAGNOSIS — M7672 Peroneal tendinitis, left leg: Secondary | ICD-10-CM

## 2021-03-13 NOTE — Progress Notes (Signed)
Office Visit Note   Patient: Ellen Sawyer           Date of Birth: [birth year]           MRN: [id number] Visit Date: 03/13/2021              Requested by: Richmond Campbell., PA-C [address]  [place] [address] PCP: Richmond Campbell., PA-C   Assessment & Plan: Visit Diagnoses:  1. Metatarsalgia of left foot   2. Peroneal tendinitis of left lower extremity     Plan: Patient has a cam walker boot at home-see her notes for the next 2 weeks to set this is peroneal tendon debridement.  After she comes out of the cam walker boot she is to get into good supportive shoes with the back or stop.  We will send her to formal physical therapy for modalities to the left peroneal tendons eccentric exercises to the left peroneal tendons ,gastrocsoleus stretching, home exercise program, gait and balance.  Discussed with her placing her on a Medrol Dosepak she states that she does not like to take Medrol Dosepak therefore we will have her try Voltaren gel particularly over the metatarsal heads of the left foot and over the peroneal tendon region.  Follow-Up Instructions: Return in about 4 weeks (around 04/10/2021).   Orders:  Orders Placed This Encounter  Procedures  . XR Ankle Complete Left  . XR Foot Complete Left   No orders of the defined types were placed in this encounter.     Procedures: No procedures performed   Clinical Data: No additional findings.   Subjective: Chief Complaint  Patient presents with  . Left Foot - Pain  . Left Ankle - Pain    HPI Ellen Sawyer is well-known to our department service comes in today with left foot and ankle pain.  She states pains been ongoing for a couple of weeks.  She states she felt a pop lateral aspect of her ankle after stepping in a hole 2 years ago.  She has been dealing with pain for pain is becoming worse.  She is also having pain around the heads of the left foot.. Pain is worse with walking and standing.  She has  tried naproxen ice and elevation without any real relief.  She notes swelling over the dorsal aspect of the left foot.  Review of Systems See HPI  Objective: Vital Signs: There were no vitals taken for this visit.  Physical Exam Constitutional:      Appearance: She is not ill-appearing or diaphoretic.  Pulmonary:     Effort: Pulmonary effort is normal.  Neurological:     Mental Status: She is alert and oriented to person, place, and time.  Psychiatric:        Mood and Affect: Mood normal.     Ortho Exam Gastrocs left higher than right.  Dorsal pedal pulses are present bilaterally.  No rashes skin lesions ulcerations bilateral feet.  Slight dorsal left foot edema.  No ecchymosis about either foot.  Tenderness over the second third metatarsal head region.  She has 5 5 strength with inversion eversion against resistance left foot.  Extreme inversion in left foot causes pain laterally.  Tenderness over the left foot peroneal tendon region down to the insertion of the brevis.  Specialty Comments:  No specialty comments available.  Imaging: XR Foot Complete Left  Result Date: 03/13/2021 Left foot 3 views: No acute fractures.  First TMT joint arthritic  changes.  No subluxations dislocations.  Otherwise no bony abnormalities.    PMFS History: Patient Active Problem List   Diagnosis Date Noted  . 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 Unilateral primary osteoarthritis, left hip 01/14/2018  . Spondylolisthesis at L4-L5 level 09/09/2017   Past Medical History:  Diagnosis Date  . Anxiety    DULoxetine (CYMBALTA)  . Asthma   . Chest discomfort    in the past  . Chest pain    in the past  . History of hiatal hernia   . Hypercholesterolemia   . Hypertriglyceridemia   . Osteoarthritis of left hip   . PONV (postoperative nausea and vomiting)   . Pre-diabetes    year ago was on metformin, then she has been of metforming for many years    Family History   Problem Relation Age of Onset  . Coronary artery disease Father        1970, several bypass  . Dementia Father   . Pancreatic cancer Mother     Past Surgical History:  Procedure Laterality Date  . CARDIAC CATHETERIZATION     2008  . CESAREAN SECTION  1986 and 1989  . COLONOSCOPY    . feet surgery  1990   four  . herniated disc  1990 and 1992   two  . LUMBAR LAMINECTOMY     rods screws, cadiver bone   . TONSILLECTOMY    . TOOTH EXTRACTION    . TOTAL HIP ARTHROPLASTY Left 02/11/2018  . TOTAL HIP ARTHROPLASTY Left 02/11/2018   Procedure: LEFT TOTAL HIP ARTHROPLASTY ANTERIOR APPROACH;  Surgeon: Kathryne Hitch, MD;  Location: [facility] OR;  Service: Orthopedics;  Laterality: Left;  . WISDOM TOOTH EXTRACTION     Social History   Occupational History  . Not on file  Tobacco Use  . Smoking status: Never Smoker  . Smokeless tobacco: Never Used  Vaping Use  . Vaping Use: Never used  Substance and Sexual Activity  . Alcohol use: No  . Drug use: No  . Sexual activity: Not on file

## 2021-03-22 ENCOUNTER — Other Ambulatory Visit: Payer: Self-pay

## 2021-03-22 ENCOUNTER — Telehealth: Payer: Self-pay | Admitting: Physician Assistant

## 2021-03-22 DIAGNOSIS — M7672 Peroneal tendinitis, left leg: Secondary | ICD-10-CM

## 2021-03-22 DIAGNOSIS — M7742 Metatarsalgia, left foot: Secondary | ICD-10-CM

## 2021-03-22 NOTE — Telephone Encounter (Signed)
Pt states that she was suppose to be referred to physical therapy? Just let me know and I will get her scheduled!

## 2021-03-22 NOTE — Telephone Encounter (Signed)
I am not sure how that referral got missed but can we get her in pretty soon?

## 2021-03-27 ENCOUNTER — Ambulatory Visit (INDEPENDENT_AMBULATORY_CARE_PROVIDER_SITE_OTHER): Payer: PRIVATE HEALTH INSURANCE | Admitting: Physical Therapy

## 2021-03-27 ENCOUNTER — Other Ambulatory Visit: Payer: Self-pay

## 2021-03-27 DIAGNOSIS — M25572 Pain in left ankle and joints of left foot: Secondary | ICD-10-CM | POA: Diagnosis not present

## 2021-03-27 DIAGNOSIS — M79672 Pain in left foot: Secondary | ICD-10-CM

## 2021-03-27 DIAGNOSIS — R600 Localized edema: Secondary | ICD-10-CM

## 2021-03-27 NOTE — Addendum Note (Signed)
Addended by: Narda Amber R on: 03/27/2021 11:49 AM   Modules accepted: Orders

## 2021-03-27 NOTE — Therapy (Signed)
[facility] Physical Therapy [address] [place], [place], [address] Phone: [phone]   Fax:  [phone]  Physical Therapy Evaluation  Patient Details  Name: Ellen Sawyer MRN: [id number] Date of Birth: 12-Apr-1956 Referring Provider (PT): Doneen Poisson, MD   Encounter Date: 03/27/2021   PT End of Session - 03/27/21 1140    Visit Number 1    Number of Visits 8    Date for PT Re-Evaluation 06/02/21    Progress Note Due on Visit 10    PT Start Time 1018    PT Stop Time 1100    PT Time Calculation (min) 42 min    Activity Tolerance Patient tolerated treatment well    Behavior During Therapy [facility] for tasks assessed/performed           Past Medical History:  Diagnosis Date  . Anxiety    DULoxetine (CYMBALTA)  . Asthma   . Chest discomfort    in the past  . Chest pain    in the past  . History of hiatal hernia   . Hypercholesterolemia   . Hypertriglyceridemia   . Osteoarthritis of left hip   . PONV (postoperative nausea and vomiting)   . Pre-diabetes    year ago was on metformin, then she has been of metforming for many years    Past Surgical History:  Procedure Laterality Date  . CARDIAC CATHETERIZATION     2008  . CESAREAN SECTION  1986 and 1989  . COLONOSCOPY    . feet surgery  1990   four  . herniated disc  1990 and 1992   two  . LUMBAR LAMINECTOMY     rods screws, cadiver bone   . TONSILLECTOMY    . TOOTH EXTRACTION    . TOTAL HIP ARTHROPLASTY Left 02/11/2018  . TOTAL HIP ARTHROPLASTY Left 02/11/2018   Procedure: LEFT TOTAL HIP ARTHROPLASTY ANTERIOR APPROACH;  Surgeon: Kathryne Hitch, MD;  Location: [facility] OR;  Service: Orthopedics;  Laterality: Left;  . WISDOM TOOTH EXTRACTION      There were no vitals filed for this visit.    Subjective Assessment - 03/27/21 1024    Subjective Pt arriving reporting left foot pain which is more on the bottom and lateral side. Pt dx with metatarsalgia and peroneal tendonitis  left LE. Pt states that this has been ongoing for about 1 month. Pt reported MD suggested wearing her CAM walker for 2 weeks which Friday was her last day. Pt still reporting not being able to fit in her normal shoes. Arriving today in [place].    Pertinent History h/o stress fracture years ago, anxiety, asthma, L THA 2019, lumbar surgery 2018    How long can you walk comfortably? 15-20 minutes    Diagnostic tests X-ray, no fracture    Currently in Pain? Yes    Pain Score 6     Pain Location Foot    Pain Orientation Left    Pain Descriptors / Indicators Aching;Stabbing    Pain Type Acute pain    Pain Onset 1 to 4 weeks ago    Aggravating Factors  walking, standing, working all day    Pain Relieving Factors voltaran gel, ibrophen, ice and elevation    Effect of Pain on Daily Activities dificulty with ADL's and work related activities              [facility] PT Assessment - 03/27/21 0001      Assessment   Medical Diagnosis L foot pain M77.42,  M76.72 peroneal tendinitis    Referring Provider (PT) Doneen Poisson, MD    Hand Dominance Right    Prior Therapy yes for shoulder      Precautions   Precautions None      Restrictions   Weight Bearing Restrictions No      Balance Screen   Has the patient fallen in the past 6 months No    Is the patient reluctant to leave their home because of a fear of falling?  No      Home Environment   Living Environment Private residence    Living Arrangements Spouse/significant other    Type of Home House    Home Access Stairs to enter    Entrance Stairs-Number of Steps 5    Entrance Stairs-Rails Right    Home Layout One level      Prior Function   Level of Independence Independent    Vocation Full time employment    Vocation Requirements LPN at [employer]    Leisure working in yard in flowers, taking road trips, going to [employer]   Overall Cognitive Status Within Functional Limits for tasks assessed       Posture/Postural Control   Posture/Postural Control Postural limitations    Postural Limitations Rounded Shoulders;Forward head      ROM / Strength   AROM / PROM / Strength AROM;Strength      AROM   Overall AROM  Deficits    AROM Assessment Site Ankle    Right/Left Ankle Right;Left    Right Ankle Dorsiflexion 16    Right Ankle Plantar Flexion 32    Right Ankle Inversion 30    Right Ankle Eversion 45    Left Ankle Dorsiflexion 8    Left Ankle Plantar Flexion 15    Left Ankle Inversion 25   painful   Left Ankle Eversion 15   painful     Strength   Strength Assessment Site Ankle    Right/Left Ankle Right;Left    Right Ankle Dorsiflexion 5/5    Right Ankle Plantar Flexion 5/5    Right Ankle Inversion 4/5    Right Ankle Eversion 4/5    Left Ankle Dorsiflexion 4/5    Left Ankle Plantar Flexion 4/5    Left Ankle Inversion 3/5    Left Ankle Eversion 3/5      Palpation   Palpation comment TTP: metarsal heads      Transfers   Five time sit to stand comments  19 seconds with UE support      Ambulation/Gait   Gait Comments antalgic gait with decreased weight bearin on the left and decreased heel strike and stance phase                      Objective measurements completed on examination: See above findings.       [facility] Adult PT Treatment/Exercise - 03/27/21 0001      Exercises   Exercises Ankle      Ankle Exercises: Stretches   Soleus Stretch 2 reps;20 seconds    Gastroc Stretch 2 reps;20 seconds      Ankle Exercises: Seated   ABC's 1 rep    Other Seated Ankle Exercises 4 way ankle exercises with yellow theraband x10 each direction .                    PT Short Term Goals - 03/27/21 1031  PT SHORT TERM GOAL #1   Title Pt will be independent in her HEP.    Time 3    Period Weeks    Status New    Target Date 04/18/21             PT Long Term Goals - 03/27/21 1125      PT LONG TERM GOAL #1   Title Pt will be independent in her  HEP and progression.    Time 8    Period Weeks    Status New    Target Date 05/26/21      PT LONG TERM GOAL #2   Title Pt will be able to improve her left ankle dorsiflexion to >= 20 degrees with no pain reported.    Time 8    Period Weeks    Status New    Target Date 05/26/21      PT LONG TERM GOAL #3   Title Pt will be able to improve left plantar flexion to >/= 30 degrees with no pain reported.    Time 8    Period Weeks    Status New    Target Date 05/26/21      PT LONG TERM GOAL #4   Title Pt will be able to report pain </= 3/10 during work.    Time 8    Period Weeks    Status New    Target Date 05/26/21      PT LONG TERM GOAL #5   Title Pt will improve her FOTO to >/= 60.    Baseline FOTO 46.8 on 03/27/2021    Time 8    Period Weeks    Status New    Target Date 05/26/21                  Plan - 03/27/21 1058    Clinical Impression Statement Pt arriving to therapy reporting 1 month history of left ankle/foot pain. Pt reporting difficulty walking with no device. Pt dx with left peroneal tendonitis and left metatarsalgia. Pt presenting with limited ROM and strength in her left ankle. Skilled PT needed to address pt's impairments with the below interventions.    Examination-Activity Limitations Other;Lift;Stairs;Squat;Stand    Examination-Participation Restrictions Other;Occupation    Stability/Clinical Decision Making Stable/Uncomplicated    Clinical Decision Making Low    Rehab Potential Good    PT Frequency 1x / week    PT Duration 8 weeks    PT Treatment/Interventions ADLs/Self Care Home Management;Electrical Stimulation;Iontophoresis 4mg /ml Dexamethasone;Moist Heat;Ultrasound;Gait training;Stair training;Functional mobility training;Therapeutic activities;Therapeutic exercise;Balance training;Neuromuscular re-education;Patient/family education;Passive range of motion;Dry needling;Taping;Manual techniques    PT Next Visit Plan stretching, consider Ionto,  srengthening, gait    PT Home Exercise Plan Access Code: [email or web address]  URL: [email or web address]  Date: 03/27/2021  Prepared by: Narda AmberJennifer Antonea Gaut    Exercises  Seated Calf Stretch with Strap - 3 x daily - 7 x weekly - 3-5 reps - 30 seconds hold  Gastroc Stretch on Wall - 3 x daily - 7 x weekly - 3-5 reps - 30 secibds hold  Soleus Stretch on Wall - 3 x daily - 7 x weekly - 3-5 reps - 30 seconds hold  Seated Ankle Alphabet - 3 x daily - 7 x weekly - 1 reps  Ankle Eversion with Resistance - 3 x daily - 7 x weekly - 10 reps  Ankle Inversion with Resistance - 3 x daily - 7 x weekly - 10 reps  Ankle Dorsiflexion with Resistance -  3 x daily - 7 x weekly - 10 reps  Ankle and Toe Plantarflexion with Resistance - 3 x daily - 7 x weekly - 10 reps    Consulted and Agree with Plan of Care Patient           Patient will benefit from skilled therapeutic intervention in order to improve the following deficits and impairments:  Pain,Impaired flexibility,Difficulty walking,Decreased balance,Decreased activity tolerance,Decreased range of motion,Decreased strength  Visit Diagnosis: Pain in left ankle and joints of left foot  Pain in left foot  Localized edema     Problem List Patient Active Problem List   Diagnosis Date Noted  . 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 Unilateral primary osteoarthritis, left hip 01/14/2018  . Spondylolisthesis at L4-L5 level 09/09/2017    Sharmon Leyden, PT, MPT 03/27/2021, 11:47 AM  [facility] Physical Therapy [address] [place], [place], [address] Phone: [phone]   Fax:  [phone]  Name: GIULIA HICKEY MRN: [id number] Date of Birth: 1956-01-15

## 2021-03-27 NOTE — Patient Instructions (Signed)
Access Code: [email or web address] URL: [email or web address] Date: 03/27/2021 Prepared by: Narda Amber  Exercises Seated Calf Stretch with Strap - 3 x daily - 7 x weekly - 3-5 reps - 30 seconds hold Gastroc Stretch on Wall - 3 x daily - 7 x weekly - 3-5 reps - 30 secibds hold Soleus Stretch on Wall - 3 x daily - 7 x weekly - 3-5 reps - 30 seconds hold Seated Ankle Alphabet - 3 x daily - 7 x weekly - 1 reps Ankle Eversion with Resistance - 3 x daily - 7 x weekly - 10 reps Ankle Inversion with Resistance - 3 x daily - 7 x weekly - 10 reps Ankle Dorsiflexion with Resistance - 3 x daily - 7 x weekly - 10 reps Ankle and Toe Plantarflexion with Resistance - 3 x daily - 7 x weekly - 10 reps

## 2021-04-03 ENCOUNTER — Other Ambulatory Visit: Payer: Self-pay

## 2021-04-03 ENCOUNTER — Encounter: Payer: Self-pay | Admitting: Physical Therapy

## 2021-04-03 ENCOUNTER — Ambulatory Visit: Payer: PRIVATE HEALTH INSURANCE | Admitting: Physical Therapy

## 2021-04-03 DIAGNOSIS — M25511 Pain in right shoulder: Secondary | ICD-10-CM

## 2021-04-03 DIAGNOSIS — M25611 Stiffness of right shoulder, not elsewhere classified: Secondary | ICD-10-CM

## 2021-04-03 DIAGNOSIS — R600 Localized edema: Secondary | ICD-10-CM | POA: Diagnosis not present

## 2021-04-03 DIAGNOSIS — M79672 Pain in left foot: Secondary | ICD-10-CM

## 2021-04-03 DIAGNOSIS — M25572 Pain in left ankle and joints of left foot: Secondary | ICD-10-CM | POA: Diagnosis not present

## 2021-04-03 DIAGNOSIS — M6281 Muscle weakness (generalized): Secondary | ICD-10-CM

## 2021-04-03 NOTE — Therapy (Signed)
[facility] Physical Therapy [address] [place], [place], [address] Phone: [phone]   Fax:  [phone]  Physical Therapy Treatment  Patient Details  Name: BRIAR SWORD MRN: [id number] Date of Birth: [birth year] Referring Provider (PT): Doneen Poisson, MD   Encounter Date: 04/03/2021   PT End of Session - 04/03/21 1113    Visit Number 2    Number of Visits 8    Date for PT Re-Evaluation 06/02/21    Progress Note Due on Visit 10    PT Start Time 1015    PT Stop Time 1055    PT Time Calculation (min) 40 min    Activity Tolerance Patient tolerated treatment well    Behavior During Therapy [facility] for tasks assessed/performed           Past Medical History:  Diagnosis Date  . Anxiety    DULoxetine (CYMBALTA)  . Asthma   . Chest discomfort    in the past  . Chest pain    in the past  . History of hiatal hernia   . Hypercholesterolemia   . Hypertriglyceridemia   . Osteoarthritis of left hip   . PONV (postoperative nausea and vomiting)   . Pre-diabetes    year ago was on metformin, then she has been of metforming for many years    Past Surgical History:  Procedure Laterality Date  . CARDIAC CATHETERIZATION     2008  . CESAREAN SECTION  1986 and 1989  . COLONOSCOPY    . feet surgery  1990   four  . herniated disc  1990 and 1992   two  . LUMBAR LAMINECTOMY     rods screws, cadiver bone   . TONSILLECTOMY    . TOOTH EXTRACTION    . TOTAL HIP ARTHROPLASTY Left 02/11/2018  . TOTAL HIP ARTHROPLASTY Left 02/11/2018   Procedure: LEFT TOTAL HIP ARTHROPLASTY ANTERIOR APPROACH;  Surgeon: Kathryne Hitch, MD;  Location: [facility] OR;  Service: Orthopedics;  Laterality: Left;  . WISDOM TOOTH EXTRACTION      There were no vitals filed for this visit.   Subjective Assessment - 04/03/21 1109    Subjective Pt arriving today reporting some difficulty with medial and lateral exercises using resistance bands. Pt arriving today with 3-4/10 pain  in left foot.    Pertinent History h/o stress fracture years ago, anxiety, asthma, L THA 2019, lumbar surgery 2018    How long can you walk comfortably? 15-20 minutes    Diagnostic tests X-ray, no fracture    Patient Stated Goals Walk and work without pain    Currently in Pain? Yes    Pain Score 4     Pain Location Foot    Pain Orientation Left    Pain Descriptors / Indicators Aching    Pain Type Acute pain    Pain Onset 1 to 4 weeks ago                             [facility] Adult PT Treatment/Exercise - 04/03/21 0001      Exercises   Exercises Ankle      Modalities   Modalities Iontophoresis      Iontophoresis   Type of Iontophoresis Dexamethasone    Location left lateral ankle    Dose 10mg /ml (68ml dose)    Time 4 hour patch      Ankle Exercises: Stretches   Soleus Stretch 2 reps;30 seconds    Gastroc  Stretch 2 reps;30 seconds      Ankle Exercises: Standing   Rocker Board 1 minute    Heel Raises 10 reps    Toe Raise 10 reps      Ankle Exercises: Seated   Towel Crunch Limitations x 1 minute    BAPS Limitations 2 minutes each direction with rest breaks between each minute as needed L2    Other Seated Ankle Exercises 4 way ankle exercises with yellow theraband x10 each direction .                  PT Education - 04/03/21 1111    Education Details HEP review, exercise techniques    Person(s) Educated Patient    Methods Explanation;Demonstration;Verbal cues    Comprehension Verbalized understanding;Returned demonstration            PT Short Term Goals - 04/03/21 1118      PT SHORT TERM GOAL #1   Title Pt will be independent in her HEP.    Status On-going             PT Long Term Goals - 04/03/21 1118      PT LONG TERM GOAL #1   Title Pt will be independent in her HEP and progression.    Status On-going      PT LONG TERM GOAL #2   Title Pt will be able to improve her left ankle dorsiflexion to >= 20 degrees with no pain  reported.    Status On-going      PT LONG TERM GOAL #3   Title Pt will be able to improve left plantar flexion to >/= 30 degrees with no pain reported.    Status On-going      PT LONG TERM GOAL #4   Title Pt will be able to report pain </= 3/10 during work.    Status On-going      PT LONG TERM GOAL #5   Title Pt will improve her FOTO to >/= 60.    Status On-going                 Plan - 04/03/21 1114    Clinical Impression Statement Pt arriving today reporting 3-4/10 pain in left ankle/foot. Pt tolerating exercises well and we discussed wearing tennis shoes or shoes with back support when she is up on her feet longer periods. Ionto patch placed today and pt was instructed in wear and how to take off. Next visit assess response to Ionto patch. Continue skilled PT.    Examination-Activity Limitations Other;Lift;Stairs;Squat;Stand    Examination-Participation Restrictions Other;Occupation    Stability/Clinical Decision Making Stable/Uncomplicated    Rehab Potential Good    PT Frequency 1x / week    PT Duration 8 weeks    PT Treatment/Interventions ADLs/Self Care Home Management;Electrical Stimulation;Iontophoresis 4mg /ml Dexamethasone;Moist Heat;Ultrasound;Gait training;Stair training;Functional mobility training;Therapeutic activities;Therapeutic exercise;Balance training;Neuromuscular re-education;Patient/family education;Passive range of motion;Dry needling;Taping;Manual techniques    PT Next Visit Plan assess response to Ionto patch, stretching, srengthening, gait    PT Home Exercise Plan Access Code: [email or web address]  URL: [email or web address]  Date: 03/27/2021  Prepared by: 03/29/2021    Exercises  Seated Calf Stretch with Strap - 3 x daily - 7 x weekly - 3-5 reps - 30 seconds hold  Gastroc Stretch on Wall - 3 x daily - 7 x weekly - 3-5 reps - 30 secibds hold  Soleus Stretch on Wall - 3 x daily - 7 x weekly - 3-5 reps - 30  seconds hold  Seated Ankle Alphabet - 3 x  daily - 7 x weekly - 1 reps  Ankle Eversion with Resistance - 3 x daily - 7 x weekly - 10 reps  Ankle Inversion with Resistance - 3 x daily - 7 x weekly - 10 reps  Ankle Dorsiflexion with Resistance - 3 x daily - 7 x weekly - 10 reps  Ankle and Toe Plantarflexion with Resistance - 3 x daily - 7 x weekly - 10 reps    Consulted and Agree with Plan of Care Patient           Patient will benefit from skilled therapeutic intervention in order to improve the following deficits and impairments:  Pain,Impaired flexibility,Difficulty walking,Decreased balance,Decreased activity tolerance,Decreased range of motion,Decreased strength  Visit Diagnosis: Pain in left ankle and joints of left foot  Pain in left foot  Localized edema  Acute pain of right shoulder  Stiffness of right shoulder, not elsewhere classified  Muscle weakness (generalized)     Problem List Patient Active Problem List   Diagnosis Date Noted  . 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 Unilateral primary osteoarthritis, left hip 01/14/2018  . Spondylolisthesis at L4-L5 level 09/09/2017    Sharmon Leyden , PT, MPT 04/03/2021, 11:37 AM  [facility] Physical Therapy [address] [place], [place], [address] Phone: [phone]   Fax:  [phone]  Name: JAZIYAH GRADEL MRN: [id number] Date of Birth: 1956-05-17

## 2021-04-10 ENCOUNTER — Ambulatory Visit: Payer: PRIVATE HEALTH INSURANCE | Admitting: Physician Assistant

## 2021-04-10 ENCOUNTER — Encounter: Payer: Self-pay | Admitting: Physician Assistant

## 2021-04-10 ENCOUNTER — Other Ambulatory Visit: Payer: Self-pay

## 2021-04-10 ENCOUNTER — Ambulatory Visit: Payer: PRIVATE HEALTH INSURANCE | Admitting: Physical Therapy

## 2021-04-10 DIAGNOSIS — M79672 Pain in left foot: Secondary | ICD-10-CM | POA: Diagnosis not present

## 2021-04-10 DIAGNOSIS — M7672 Peroneal tendinitis, left leg: Secondary | ICD-10-CM

## 2021-04-10 DIAGNOSIS — M25572 Pain in left ankle and joints of left foot: Secondary | ICD-10-CM | POA: Diagnosis not present

## 2021-04-10 DIAGNOSIS — R600 Localized edema: Secondary | ICD-10-CM | POA: Diagnosis not present

## 2021-04-10 DIAGNOSIS — M7742 Metatarsalgia, left foot: Secondary | ICD-10-CM | POA: Diagnosis not present

## 2021-04-10 NOTE — Progress Notes (Signed)
HPI: Ellen Sawyer returns today follow-up of her left foot metatarsalgia and peroneal tendinitis.  She is been going to therapy feels like he is at least 50% better.  Her main complaint is still the metatarsal area of the left foot.  She has changed her shoewear and feels it is beneficial.  She using Voltaren gel but is unsure if this helps much.  She does feel that the iontophoresis therapy has helped.  Review of systems see HPI otherwise negative  Physical exam: Left foot dorsal pedal pulse 2+, 5 /5 strength with inversion eversion against resistance.  Tenderness over the left metatarsal head region.  She has tenderness over the posterior tibial tendon peroneal tendons.  Able to perform single heel raise on the left still difficult.  Minimal tenderness over the medial tubercle of the calcaneus.  No abnormal warmth erythema or impending ulcers.  Impression: Left foot peroneal tendinitis Left foot metatarsalgia  Plan: She will continue to work with therapy on range of motion strengthening.  When she is getting a home exercise program she can discontinue therapy.  She is given metatarsal pads to offload the metatarsal heads.  Recommend she continue to use Voltaren gel.  Follow-up with [place] in 3 months if she continues to have pain in the foot otherwise as needed.  Discussed with her that this will take at least 3 months to calm down completely.  Questions encouraged and answered.

## 2021-04-10 NOTE — Therapy (Addendum)
[facility] [address] [place], [place], [address] Phone: [phone]   Fax:  [phone]  Physical Therapy Treatment / Discharge   Patient Details  Name: Ellen Sawyer MRN: [id number] Date of Birth: [birth year] Referring Provider (PT): Jean Rosenthal, MD   Encounter Date: 04/10/2021   PT End of Session - 04/10/21 1059     Visit Number 3    Number of Visits 8    Date for PT Re-Evaluation 06/02/21    Progress Note Due on Visit 10    PT Start Time 1059    PT Stop Time 1137    PT Time Calculation (min) 38 min             Past Medical History:  Diagnosis Date   Anxiety    DULoxetine (CYMBALTA)   Asthma    Chest discomfort    in the past   Chest pain    in the past   History of hiatal hernia    Hypercholesterolemia    Hypertriglyceridemia    Osteoarthritis of left hip    PONV (postoperative nausea and vomiting)    Pre-diabetes    year ago was on metformin, then she has been of metforming for many years    Past Surgical History:  Procedure Laterality Date   CARDIAC CATHETERIZATION     2008   [place]     feet surgery  1990   four   herniated disc  1990 and 1992   two   LUMBAR LAMINECTOMY     rods screws, cadiver bone    TONSILLECTOMY     TOOTH EXTRACTION     TOTAL HIP ARTHROPLASTY Left 02/11/2018   TOTAL HIP ARTHROPLASTY Left 02/11/2018   Procedure: LEFT TOTAL HIP ARTHROPLASTY ANTERIOR APPROACH;  Surgeon: Mcarthur Rossetti, MD;  Location: [facility];  Service: Orthopedics;  Laterality: Left;   WISDOM TOOTH EXTRACTION      There were no vitals filed for this visit.   Subjective Assessment - 04/10/21 1059     Subjective PT just saw her MD and they issued a heel lift in the left shoe and it seems to be helping. She feels like the patch helped also. she requested to decrease frequency as she is having to pay for therapy and she will perform her HEP    Patient Stated  Goals Walk and work without pain    Currently in Pain? Yes    Pain Score 3     Pain Location Ankle    Pain Orientation Left    Pain Descriptors / Indicators Aching    Pain Type Acute pain    Pain Onset 1 to 4 weeks ago    Aggravating Factors  walking                               [facility] Adult PT Treatment/Exercise - 04/10/21 0001       Exercises   Exercises Ankle      Ankle Exercises: Stretches   Other Stretch supine HS with strap, SKTC and ITB stretch      Ankle Exercises: Aerobic   Nustep L4x5' LE only      Ankle Exercises: Standing   SLS Lt at counter, then with vectors    Heel Raises 10 reps    Toe Raise 10 reps    Other Standing Ankle Exercises stance phase of gait Lt  LE      Ankle Exercises: Supine   Other Supine Ankle Exercises 10 reps bridging and LAQ qith 7.5#                      PT Short Term Goals - 04/03/21 1118       PT SHORT TERM GOAL #1   Title Pt will be independent in her HEP.    Status On-going               PT Long Term Goals - 04/03/21 1118       PT LONG TERM GOAL #1   Title Pt will be independent in her HEP and progression.    Status On-going      PT LONG TERM GOAL #2   Title Pt will be able to improve her left ankle dorsiflexion to >= 20 degrees with no pain reported.    Status On-going      PT LONG TERM GOAL #3   Title Pt will be able to improve left plantar flexion to >/= 30 degrees with no pain reported.    Status On-going      PT LONG TERM GOAL #4   Title Pt will be able to report pain </= 3/10 during work.    Status On-going      PT LONG TERM GOAL #5   Title Pt will improve her FOTO to >/= 60.    Status On-going                   Plan - 04/10/21 1104     Clinical Impression Statement Pt requested to decrease frequency to every other or every third week as she is paying out of network. She was issued HEP progression to being in a bout a week.  MD issued a heel lift today as  well. She also reports she had hip surgery and has never recovered fully from this, feels like she needs strengthening.  Todays exercise worked the hip and ankle a lot and she began to use knee hyperextension to gain stability    Rehab Potential Good    PT Frequency 1x / week    PT Duration 8 weeks    PT Treatment/Interventions ADLs/Self Care Home Management;Electrical Stimulation;Iontophoresis 59m/ml Dexamethasone;Moist Heat;Ultrasound;Gait training;Stair training;Functional mobility training;Therapeutic activities;Therapeutic exercise;Balance training;Neuromuscular re-education;Patient/family education;Passive range of motion;Dry needling;Taping;Manual techniques    PT Next Visit Plan assess progress with HEP progression assess goals    PT Home Exercise Plan Access Code: [email or web address]  URL: [email or web address]  Date: 03/27/2021  Prepared by: JKearney Hard   Exercises  Seated Calf Stretch with Strap - 3 x daily - 7 x weekly - 3-5 reps - 30 seconds hold  Gastroc Stretch on Wall - 3 x daily - 7 x weekly - 3-5 reps - 30 secibds hold  Soleus Stretch on Wall - 3 x daily - 7 x weekly - 3-5 reps - 30 seconds hold  Seated Ankle Alphabet - 3 x daily - 7 x weekly - 1 reps  Ankle Eversion with Resistance - 3 x daily - 7 x weekly - 10 reps  Ankle Inversion with Resistance - 3 x daily - 7 x weekly - 10 reps  Ankle Dorsiflexion with Resistance - 3 x daily - 7 x weekly - 10 reps  Ankle and Toe Plantarflexion with Resistance - 3 x daily - 7 x weekly - 10 reps    Consulted and Agree with Plan of Care Patient  Patient will benefit from skilled therapeutic intervention in order to improve the following deficits and impairments:  Pain,Impaired flexibility,Difficulty walking,Decreased balance,Decreased activity tolerance,Decreased range of motion,Decreased strength  Visit Diagnosis: Pain in left ankle and joints of left foot  Pain in left foot  Localized edema     Problem  List Patient Active Problem List   Diagnosis Date Noted   Status post total replacement of left hip 02/11/2018   Unilateral primary osteoarthritis, left hip 01/14/2018   Spondylolisthesis at L4-L5 level 09/09/2017    Jeral Pinch PT  04/10/2021, 12:40 PM  PHYSICAL THERAPY DISCHARGE SUMMARY  Visits from Start of Care: 3  Current functional level related to goals / functional outcomes: See note   Remaining deficits: See note   Education / Equipment: HEP   Patient agrees to discharge. Patient goals were partially met. Patient is being discharged due to not returning since the last visit.  Scot Jun, PT, DPT, OCS, ATC 05/30/21  1:16 PM     [facility] [address] [place], [place], [address] Phone: [phone]   Fax:  [phone]  Name: BRIAHNA PESCADOR MRN: [id number] Date of Birth: [birth year]

## 2021-04-17 ENCOUNTER — Encounter: Payer: PRIVATE HEALTH INSURANCE | Admitting: Physical Therapy

## 2021-04-24 ENCOUNTER — Encounter: Payer: PRIVATE HEALTH INSURANCE | Admitting: Physical Therapy

## 2021-05-08 ENCOUNTER — Encounter: Payer: PRIVATE HEALTH INSURANCE | Admitting: Physical Therapy

## 2021-09-08 ENCOUNTER — Encounter: Payer: Self-pay | Admitting: Internal Medicine

## 2021-09-08 ENCOUNTER — Ambulatory Visit (INDEPENDENT_AMBULATORY_CARE_PROVIDER_SITE_OTHER): Payer: No Typology Code available for payment source

## 2021-09-08 ENCOUNTER — Ambulatory Visit: Payer: PRIVATE HEALTH INSURANCE | Admitting: Internal Medicine

## 2021-09-08 ENCOUNTER — Other Ambulatory Visit: Payer: Self-pay

## 2021-09-08 DIAGNOSIS — R0609 Other forms of dyspnea: Secondary | ICD-10-CM

## 2021-09-08 DIAGNOSIS — J45991 Cough variant asthma: Secondary | ICD-10-CM

## 2021-09-08 LAB — CBC WITH DIFFERENTIAL/PLATELET
Basophils Absolute: 0.1 10*3/uL (ref 0.0–0.1)
Basophils Relative: 1.2 % (ref 0.0–3.0)
Eosinophils Absolute: 0.3 10*3/uL (ref 0.0–0.7)
Eosinophils Relative: 3.3 % (ref 0.0–5.0)
HCT: 41 % (ref 36.0–46.0)
Hemoglobin: 13.1 g/dL (ref 12.0–15.0)
Lymphocytes Relative: 31 % (ref 12.0–46.0)
Lymphs Abs: 3 10*3/uL (ref 0.7–4.0)
MCHC: 31.9 g/dL (ref 30.0–36.0)
MCV: 83.3 fl (ref 78.0–100.0)
Monocytes Absolute: 0.6 10*3/uL (ref 0.1–1.0)
Monocytes Relative: 6.7 % (ref 3.0–12.0)
Neutro Abs: 5.6 10*3/uL (ref 1.4–7.7)
Neutrophils Relative %: 57.8 % (ref 43.0–77.0)
Platelets: 275 10*3/uL (ref 150.0–400.0)
RBC: 4.92 Mil/uL (ref 3.87–5.11)
RDW: 16.4 % — ABNORMAL HIGH (ref 11.5–15.5)
WBC: 9.7 10*3/uL (ref 4.0–10.5)

## 2021-09-08 MED ORDER — PANTOPRAZOLE SODIUM 40 MG PO TBEC: 40.0000 mg | DELAYED_RELEASE_TABLET | Freq: Every day | ORAL | 2 refills | Status: DC

## 2021-09-08 MED ORDER — BUDESONIDE-FORMOTEROL FUMARATE 80-4.5 MCG/ACT IN AERO: INHALATION_SPRAY | RESPIRATORY_TRACT | 12 refills | Status: AC

## 2021-09-08 MED ORDER — METHYLPREDNISOLONE ACETATE 80 MG/ML IJ SUSP
120.0000 mg | Freq: Once | INTRAMUSCULAR | Status: AC
Start: 2021-09-08 — End: 2021-09-08
  Administered 2021-09-08: 120 mg via INTRAMUSCULAR

## 2021-09-08 MED ORDER — HYDROCODONE BIT-HOMATROP MBR 5-1.5 MG/5ML PO SOLN: 5.0000 mL | ORAL | 0 refills | Status: DC | PRN

## 2021-09-08 MED ORDER — FAMOTIDINE 20 MG PO TABS: ORAL_TABLET | ORAL | 11 refills | Status: DC

## 2021-09-08 NOTE — Patient Instructions (Addendum)
The key to effective treatment for your cough is eliminating the non-stop cycle of cough you're stuck in long enough to let your airway heal completely and then see if there is anything still making you cough once you stop the cough suppression, but this should take no more than 5 days to figure out  First take delsym two tsp every 12 hours and supplement if needed  hydromet 1 -2 tsp every 4 hours    Depomedrol 120 mg IM today  Protonix (pantoprazole) Take 30-60 min before first meal of the day and Pepcid 20 mg one bedtime plus chlorpheniramine 4 mg x 2 at bedtime (both available over the counter)  until cough is completely gone for at least a week without the need for cough suppression  GERD (REFLUX)  is an extremely common cause of respiratory symptoms, many times with no significant heartburn at all.    It can be treated with medication, but also with lifestyle changes including avoidance of late meals, excessive alcohol, smoking cessation, and avoid fatty foods, chocolate, peppermint, colas, red wine, and acidic juices such as orange juice.  NO MINT OR MENTHOL PRODUCTS SO NO COUGH DROPS  USE HARD CANDY INSTEAD (jolley ranchers or Stover's or Lifesavers (all available in sugarless versions) NO OIL BASED VITAMINS - use powdered substitutes.  Change symbicort 80  Take 2 puffs first thing in am and then another 2 puffs about 12 hours later for trouble breathing   Please remember to go to the lab and x-ray department  for your tests - we will call you with the results when they are available.  Work on inhaler technique:  relax and gently blow all the way out then take a nice smooth full deep breath back in, triggering the inhaler at same time you start breathing in.  Hold for up to 5 seconds if you can. Blow out thru nose. Rinse and gargle with water when done.  If mouth or throat bother you at all,  try brushing teeth/gums/tongue with arm and hammer toothpaste/ make a slurry and gargle and spit  out.           Please schedule a follow up office visit in 4 weeks, sooner if needed

## 2021-09-08 NOTE — Assessment & Plan Note (Signed)
Most likely related to uacs.  Upper airway cough syndrome (previously labeled PNDS),  is so named because it's frequently impossible to sort out how much is  CR/sinusitis with freq throat clearing (which can be related to primary GERD)   vs  causing  secondary (" extra esophageal")  GERD from wide swings in gastric pressure that occur with throat clearing, often  promoting self use of mint and menthol lozenges that reduce the lower esophageal sphincter tone and exacerbate the problem further in a cyclical fashion.   These are the same pts (now being labeled as having "irritable larynx syndrome" by some cough centers) who not infrequently have a history of having failed to tolerate ace inhibitors,  dry powder inhalers or biphosphonates or report having atypical/extraesophageal reflux symptoms that don't respond to standard doses of PPI  and are easily confused as having aecopd or asthma flares by even experienced allergists/ pulmonologists (myself included).

## 2021-09-08 NOTE — Progress Notes (Signed)
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 All asthma?    Onset was around 2016 intermittent flares of cough  esp iritants like perfume controlled with symbicort 160 prn and pepcid     History of Present Illness  09/08/2021  Pulmonary/ 1st office eval/Rondell Pardon flare 09/05/21 p exp to cleaning solution at work so started back so in addition had to add albuterol and steroid shot and neb nothing worked Stage manager Complaint  Patient presents with   Consult    SOB: during exertion and even at rest. Asthma  Dyspnea:  not between spells  Cough: severe hacking harsh dry worse in am p stirs x sev hours, worse at work, assoc with urinary incont  Sleep: tends to settles overnight / flat bed one pillow  SABA use: not working   No obvious other patterns day to day or daytime variability or assoc excess/ purulent sputum or mucus plugs or hemoptysis or cp or chest tightness, subjective wheeze or overt sinus or hb symptoms.   Sleeping ok as above without nocturnal  exacerbation  of respiratory  c/o's or need for noct saba. Also denies any obvious fluctuation of symptoms with weather or environmental changes or other aggravating or alleviating factors except as outlined above   No unusual exposure hx or h/o childhood pna/ asthma or knowledge of premature birth.  Current Allergies, Complete Past Medical History, Past Surgical History, Family History, and Social History were reviewed in [employer] record.  ROS  The following are not active complaints unless bolded Hoarseness, sore throat, dysphagia, dental problems, itching, sneezing,  nasal congestion or discharge of excess mucus or purulent secretions, ear ache,   fever, chills, sweats, unintended wt loss or wt gain,  classically pleuritic or exertional cp,  orthopnea pnd or arm/hand swelling  or leg swelling, presyncope, palpitations, abdominal pain, anorexia, nausea, vomiting, diarrhea  or change in bowel habits or change in bladder habits, change in stools or change in urine, dysuria, hematuria,  rash, arthralgias, visual complaints, headache, numbness, weakness or ataxia or problems with walking or coordination,  change in mood or  memory.           Past Medical History:  Diagnosis Date   Anxiety    DULoxetine (CYMBALTA)   Asthma    Chest discomfort    in the past   Chest pain    in the past   History of hiatal hernia    Hypercholesterolemia    Hypertriglyceridemia    Osteoarthritis of left hip    PONV (postoperative nausea and vomiting)    Pre-diabetes    year ago was on metformin, then she has been of metforming for many years    Outpatient Medications Prior to Visit  Medication Sig Dispense Refill   albuterol (VENTOLIN HFA) 108 (90 Base) MCG/ACT inhaler Inhale into the lungs.     aspirin 81 MG EC tablet TAKE 1 TABLET BY MOUTH EVERY DAY (Patient taking differently: 325 mg.) 60 tablet 0   b complex vitamins tablet Take 1 tablet by mouth daily.     b complex vitamins tablet Take by mouth.     B Complex-Biotin-FA (SUPER QUINTS B-50) TABS Take by mouth.  budesonide-formoterol (SYMBICORT) 160-4.5 MCG/ACT inhaler Inhale 2 puffs into the lungs 2 (two) times daily as needed (for shortness of breath).      DULoxetine (CYMBALTA) 60 MG capsule Take 60 mg by mouth daily.     levothyroxine (SYNTHROID, LEVOTHROID) 50 MCG tablet Take 50 mcg by mouth daily before breakfast.      ondansetron (ZOFRAN ODT) 4 MG disintegrating tablet Take 1 tablet (4 mg total) by mouth every 8 (eight) hours as needed for nausea or vomiting. 20 tablet 0   Vitamin D, Ergocalciferol, (DRISDOL) 50000 units CAPS capsule Take 50,000 Units by mouth every 7 (seven) days. Saturdays     No facility-administered medications prior to  visit.     Objective:     BP 108/78 (BP Location: Left Arm, Cuff Size: Normal)   Pulse 83   Temp 98 F (36.7 C)   Ht 5' (1.524 m)   Wt 194 lb 6.4 oz (88.2 kg)   SpO2 96% Comment: RA  BMI 37.97 kg/m   SpO2: 96 % (RA)  Amb pleasant wf nad harsh dry hacking cough    HEENT : pt wearing mask not removed for exam due to covid -19 concerns.    NECK :  without JVD/Nodes/TM/ nl carotid upstrokes bilaterally   LUNGS: no acc muscle use,  Nl contour chest which is clear to A and P bilaterally without cough on insp or exp maneuvers   CV:  RRR  no s3 or murmur or increase in P2, and no edema   ABD:  soft and nontender with nl inspiratory excursion in the supine position. No bruits or organomegaly appreciated, bowel sounds nl  MS:  Nl gait/ ext warm without deformities, calf tenderness, cyanosis or clubbing No obvious joint restrictions   SKIN: warm and dry without lesions    NEURO:  alert, approp, nl sensorium with  no motor or cerebellar deficits apparent.   CXR PA and Lateral:   09/08/2021 :    I personally reviewed images and agree with radiology impression as follows:   Slt kyphosis, low lung volumes/ no acute changes    Labs ordered 09/08/2021  :  allergy profile         Assessment   Cough variant asthma Onset 2016 triggered by irritants and then becoming cyclical  - during flare Allergy profile 09/08/2021 >  Eos 0. /  IgE  pending - 09/08/2021  After extensive coaching inhaler device,  effectiveness =   75% from a baseline of near 0 > try change symbicort to 80 2bid prn   The most common causes of chronic cough in immunocompetent adults include the following: upper airway cough syndrome (UACS), previously referred to as postnasal drip syndrome (PNDS), which is caused by variety of rhinosinus conditions; (2) asthma; (3) GERD; (4) chronic bronchitis from cigarette smoking or other inhaled environmental irritants; (5) nonasthmatic eosinophilic bronchitis; and (6)  bronchiectasis.   These conditions, singly or in combination, have accounted for up to 94% of the causes of chronic cough in prospective studies.   Other conditions have constituted no >6% of the causes in prospective studies These have included bronchogenic carcinoma, chronic interstitial pneumonia, sarcoidosis, left ventricular failure, ACEI-induced cough, and aspiration from a condition associated with pharyngeal dysfunction.    Chronic cough is often simultaneously caused by more than one condition. A single cause has been found from 38 to 82% of the time, multiple causes from 18 to 62%. Multiply caused cough has been the result of three diseases  up to 42% of the time.       Of the three most common causes of  Sub-acute / recurrent or chronic cough, only one (GERD)  can actually contribute to/ trigger  the other two (asthma and post nasal drip syndrome)  and perpetuate the cylce of cough.  While not intuitively obvious, many patients with chronic low grade reflux do not cough until there is a primary insult that disturbs the protective epithelial barrier and exposes sensitive nerve endings.   This is typically viral but can due to PNDS and  either may apply here.   >>>    The point is that once this occurs, it is difficult to eliminate the cycle  using anything but a maximally effective acid suppression regimen at least in the short run, accompanied by an appropriate diet to address non acid GERD and control / eliminate the cough itself for at least 3 days with hydromet    F/u in 4 weeks, all sooner if not improving   DOE (dyspnea on exertion) Most likely related to uacs.  Upper airway cough syndrome (previously labeled PNDS),  is so named because it's frequently impossible to sort out how much is  CR/sinusitis with freq throat clearing (which can be related to primary GERD)   vs  causing  secondary (" extra esophageal")  GERD from wide swings in gastric pressure that occur with throat  clearing, often  promoting self use of mint and menthol lozenges that reduce the lower esophageal sphincter tone and exacerbate the problem further in a cyclical fashion.   These are the same pts (now being labeled as having "irritable larynx syndrome" by some cough centers) who not infrequently have a history of having failed to tolerate ace inhibitors,  dry powder inhalers or biphosphonates or report having atypical/extraesophageal reflux symptoms that don't respond to standard doses of PPI  and are easily confused as having aecopd or asthma flares by even experienced allergists/ pulmonologists (myself included).      Each maintenance medication was reviewed in detail including emphasizing most importantly the difference between maintenance and prns and under what circumstances the prns are to be triggered using an action plan format where appropriate.  Total time for H and P, chart review, counseling, reviewing hfa  device(s) and generating customized AVS unique to this office visit / same day charting = 45 min         Sandrea Hughs, MD 09/08/2021

## 2021-09-08 NOTE — Assessment & Plan Note (Signed)
Onset 2016 triggered by irritants and then becoming cyclical  - during flare Allergy profile 09/08/2021 >  Eos 0. /  IgE  pending - 09/08/2021  After extensive coaching inhaler device,  effectiveness =   75% from a baseline of near 0 > try change symbicort to 80 2bid prn   The most common causes of chronic cough in immunocompetent adults include the following: upper airway cough syndrome (UACS), previously referred to as postnasal drip syndrome (PNDS), which is caused by variety of rhinosinus conditions; (2) asthma; (3) GERD; (4) chronic bronchitis from cigarette smoking or other inhaled environmental irritants; (5) nonasthmatic eosinophilic bronchitis; and (6) bronchiectasis.   These conditions, singly or in combination, have accounted for up to 94% of the causes of chronic cough in prospective studies.   Other conditions have constituted no >6% of the causes in prospective studies These have included bronchogenic carcinoma, chronic interstitial pneumonia, sarcoidosis, left ventricular failure, ACEI-induced cough, and aspiration from a condition associated with pharyngeal dysfunction.    Chronic cough is often simultaneously caused by more than one condition. A single cause has been found from 38 to 82% of the time, multiple causes from 18 to 62%. Multiply caused cough has been the result of three diseases up to 42% of the time.       Of the three most common causes of  Sub-acute / recurrent or chronic cough, only one (GERD)  can actually contribute to/ trigger  the other two (asthma and post nasal drip syndrome)  and perpetuate the cylce of cough.  While not intuitively obvious, many patients with chronic low grade reflux do not cough until there is a primary insult that disturbs the protective epithelial barrier and exposes sensitive nerve endings.   This is typically viral but can due to PNDS and  either may apply here.   >>>    The point is that once this occurs, it is difficult to eliminate  the cycle  using anything but a maximally effective acid suppression regimen at least in the short run, accompanied by an appropriate diet to address non acid GERD and control / eliminate the cough itself for at least 3 days with hydromet    F/u in 4 weeks, all sooner if not improving          Each maintenance medication was reviewed in detail including emphasizing most importantly the difference between maintenance and prns and under what circumstances the prns are to be triggered using an action plan format where appropriate.  Total time for H and P, chart review, counseling, reviewing hfa  device(s) and generating customized AVS unique to this office visit / same day charting = 45 min

## 2021-09-11 LAB — IGE: IgE (Immunoglobulin E), Serum: 29 kU/L (ref <=114)

## 2021-10-16 ENCOUNTER — Ambulatory Visit: Payer: No Typology Code available for payment source | Admitting: Internal Medicine

## 2022-01-13 IMAGING — DX DG CHEST 2V
2 series · 2 of 2 positions shown · non-contrast
Comparison: 02/25/2018 chest radiograph.

CLINICAL DATA: Cough variant asthma

EXAM:
CHEST - 2 VIEW

[chest pa]
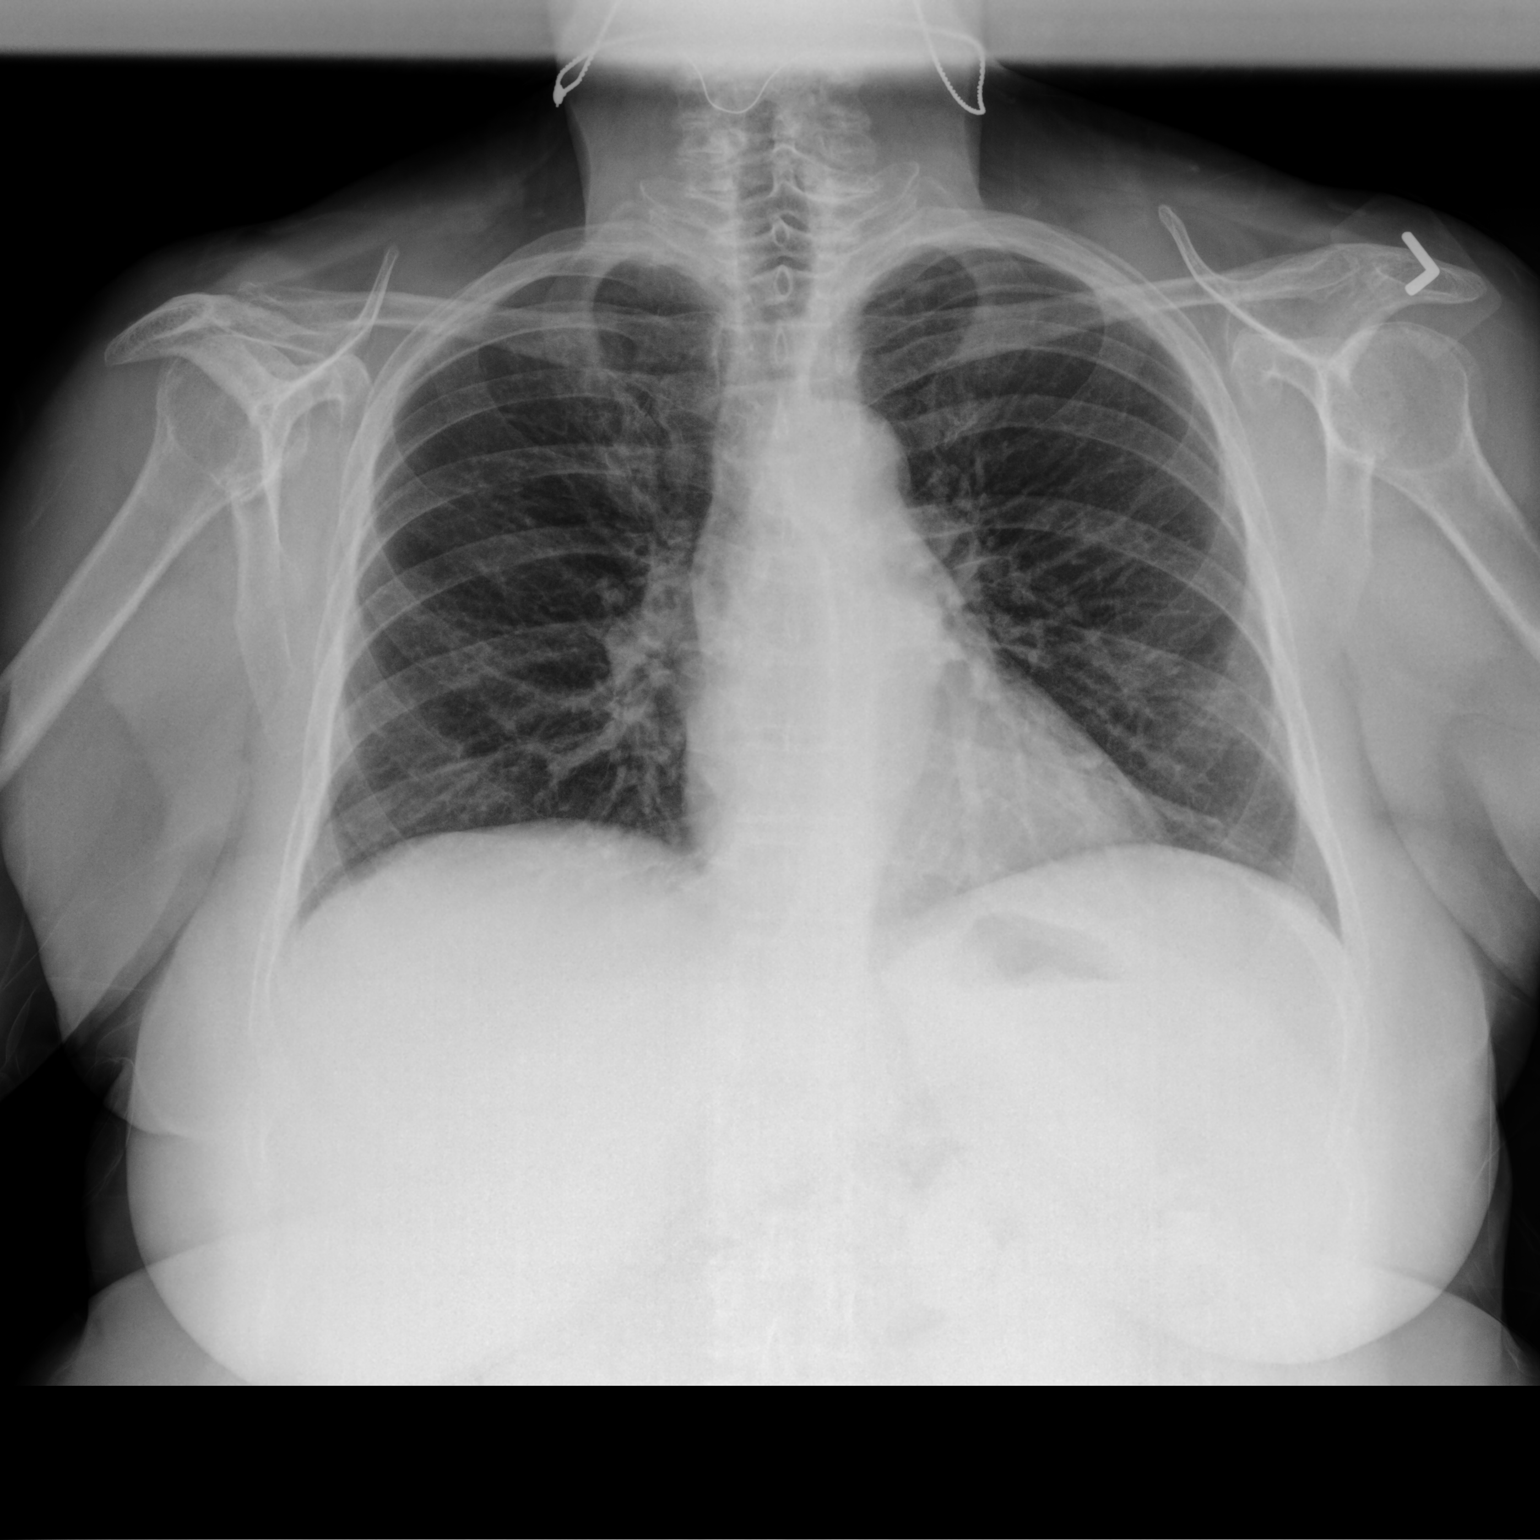

[chest lat]
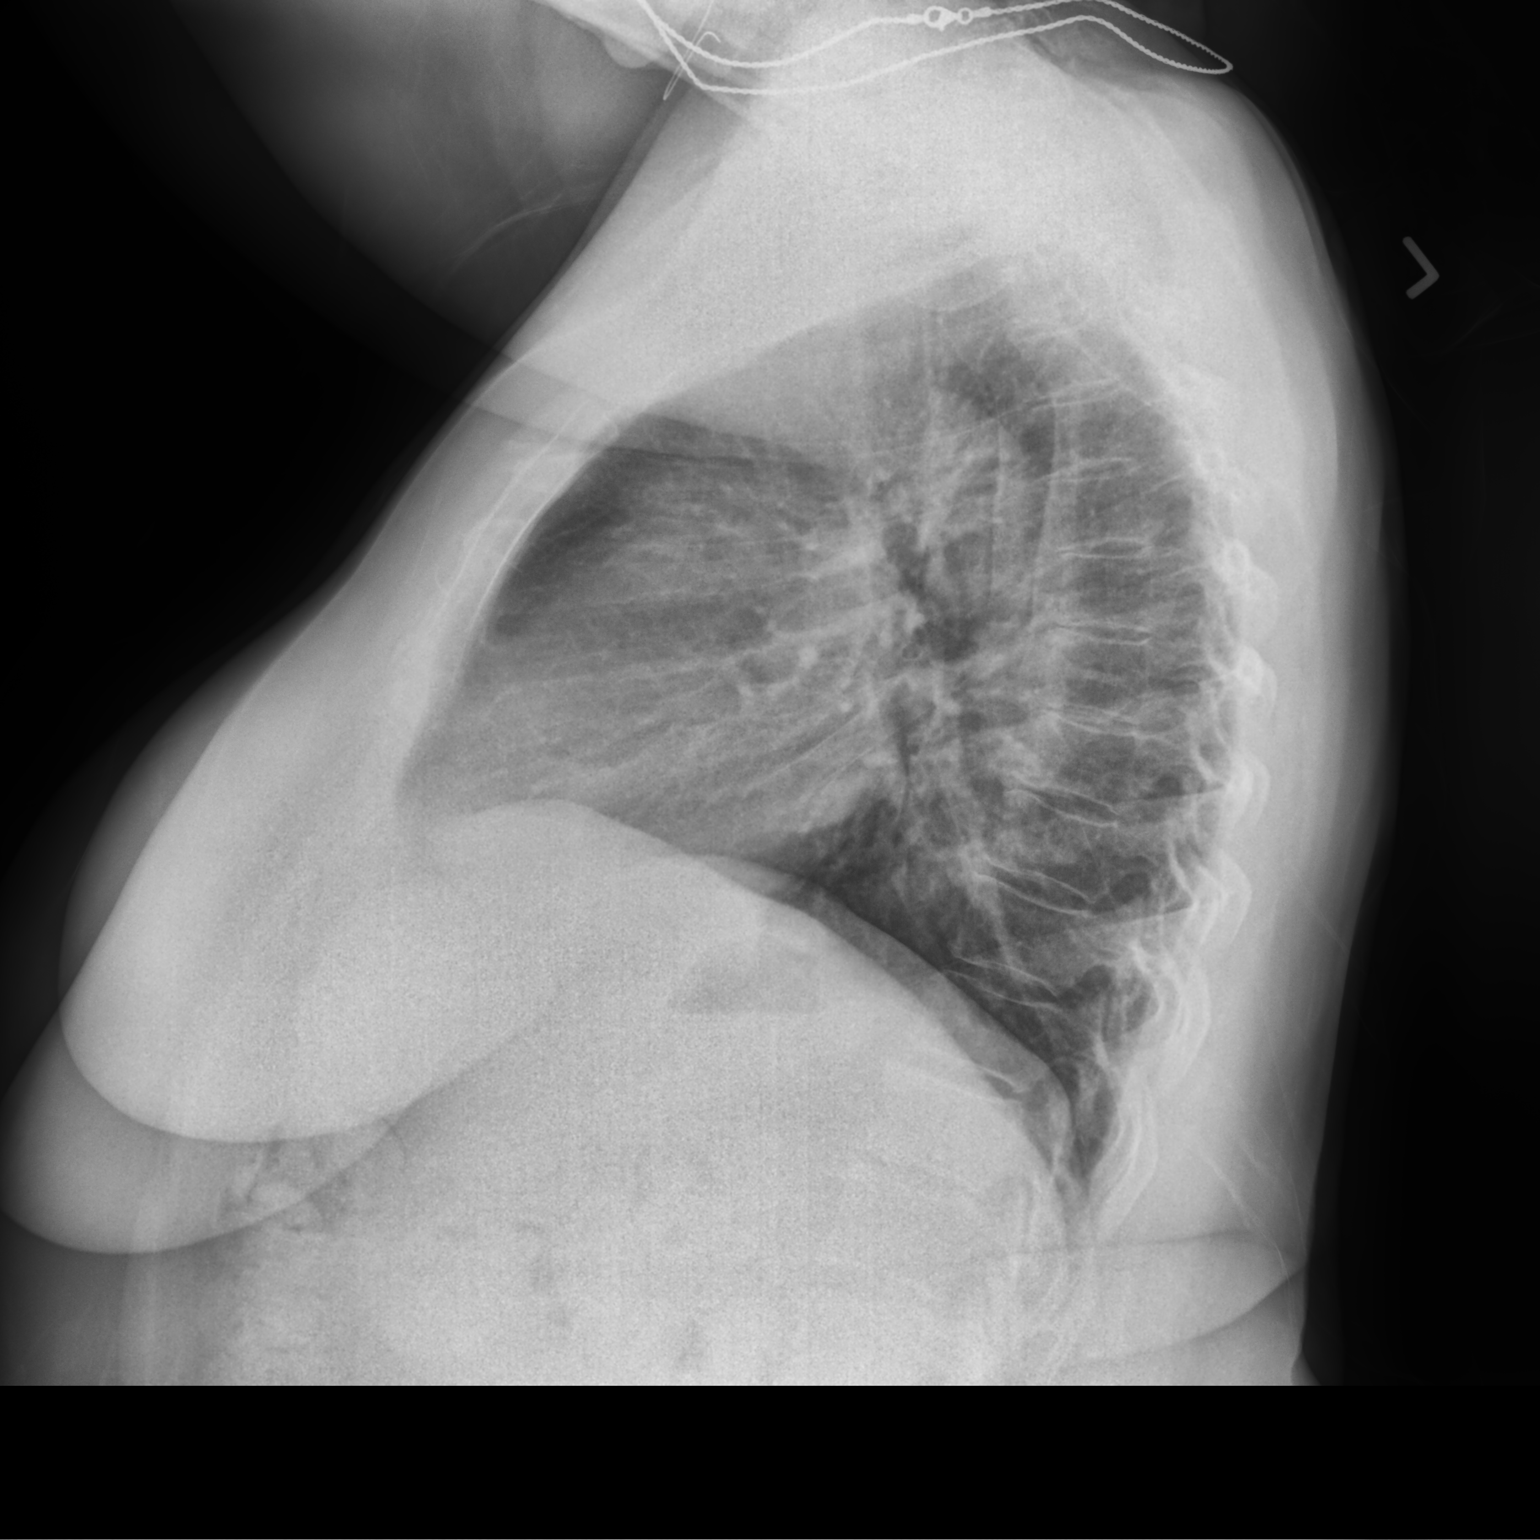

[2 of 2 positions shown; findings below may reference images not displayed]

FINDINGS: Partially visualized bilateral posterior spinal fusion hardware
extending inferiorly in the lumbar spine. Stable cardiomediastinal
silhouette with normal heart size. No pneumothorax. No pleural
effusion. Lungs appear clear, with no acute consolidative airspace
disease and no pulmonary edema.
IMPRESSION: No active cardiopulmonary disease.

## 2022-01-29 ENCOUNTER — Ambulatory Visit (INDEPENDENT_AMBULATORY_CARE_PROVIDER_SITE_OTHER): Payer: PRIVATE HEALTH INSURANCE | Admitting: Physician Assistant

## 2022-01-29 ENCOUNTER — Encounter: Payer: Self-pay | Admitting: Physician Assistant

## 2022-01-29 ENCOUNTER — Ambulatory Visit (INDEPENDENT_AMBULATORY_CARE_PROVIDER_SITE_OTHER): Payer: PRIVATE HEALTH INSURANCE

## 2022-01-29 DIAGNOSIS — M25512 Pain in left shoulder: Secondary | ICD-10-CM

## 2022-01-29 DIAGNOSIS — M542 Cervicalgia: Secondary | ICD-10-CM | POA: Diagnosis not present

## 2022-01-29 MED ORDER — TRAMADOL HCL 50 MG PO TABS: 50.0000 mg | ORAL_TABLET | Freq: Four times a day (QID) | ORAL | 0 refills | Status: DC | PRN

## 2022-01-29 NOTE — Progress Notes (Signed)
Office Visit Note   Patient: Ellen Sawyer           Date of Birth: 09/01/1956           MRN: [id number] Visit Date: 01/29/2022              Requested by: Richmond Campbell., PA-C [address],  [place] [address] PCP: Richmond Campbell., PA-C   Assessment & Plan: Visit Diagnoses:  1. Acute pain of left shoulder   2. Neck pain     Plan: Given her failure of conservative treatment which is consisted of time, injection and oral medications.  Recommend MRI to rule out rotator cuff tear.  Have her follow-up after the MRI to go over the results discuss further treatment.  She is given tramadol for pain.  Follow-Up Instructions: Return After MRI.   Orders:  Orders Placed This Encounter  Procedures   XR Cervical Spine 2 or 3 views   XR Shoulder Left   Meds ordered this encounter  Medications   traMADol (ULTRAM) 50 MG tablet    Sig: Take 1 tablet (50 mg total) by mouth every 6 (six) hours as needed.    Dispense:  30 tablet    Refill:  0      Procedures: No procedures performed   Clinical Data: No additional findings.   Subjective: Chief Complaint  Patient presents with   Left Shoulder - Pain    HPI Ellen Sawyer comes in today for left shoulder pain.  She reports 2 weeks ago she fell out of bed.  She was seen at [facility] for her shoulder.  Radiographs were obtained these are unavailable.  She also underwent subacromial injection for left shoulder pain.  Despite these measures she still having significant pain in her left shoulder.  She is someone that has undergone arthroscopy with extensive debridement of the nonrepairable rotator cuff tear by Dr. Raye Sawyer in the past.  Right shoulder she still has some pain in.  She states that the left shoulder pain feels similar to her right shoulder pain when she initially had the rotator cuff tear.  She notes occasional tingling in her fingers but no numbness tingling down the arm.  She has  good range of motion but when bringing her arm down from overhead causes her significant pain in the anterior aspect of the shoulder.  She has tried naproxen Flexeril and Voltaren gel without any real relief.  Review of Systems  Constitutional:  Negative for chills and fever.  Neurological:  Negative for syncope.    Objective: Vital Signs: There were no vitals taken for this visit.  Physical Exam Constitutional:      Appearance: She is not ill-appearing or diaphoretic.  Pulmonary:     Effort: Pulmonary effort is normal.  Neurological:     Mental Status: She is alert.  Psychiatric:        Mood and Affect: Mood normal.    Ortho Exam Bilateral shoulders external rotation against resistance reviewed feels 4-5 strength on the left 5 out of 5 on the right.  Internal rotation against resistance 5 out of 5 bilaterally.  Empty can test is negative bilaterally.  Impingement testing on the left is positive.  Liftoff test is positive bilaterally.  Specialty Comments:  No specialty comments available.  Imaging: XR Cervical Spine 2 or 3 views  Result Date: 01/29/2022 Cervical spine 2 views: Normal lordotic curvature.  Endplate spurring at multiple levels.  No spondylolisthesis no acute fractures.  XR Shoulder Left  Result Date: 01/29/2022 Left shoulder 3 views: Slight narrowing of the glenohumeral joint.  AC joint with mild arthritic changes.  No acute fracture or bony abnormalities otherwise.     PMFS History: Patient Active Problem List   Diagnosis Date Noted   DOE (dyspnea on exertion) 09/08/2021   Cough variant asthma 09/08/2021   Status post total replacement of left hip 02/11/2018   Unilateral primary osteoarthritis, left hip 01/14/2018   Spondylolisthesis at L4-L5 level 09/09/2017   Past Medical History:  Diagnosis Date   Anxiety    DULoxetine (CYMBALTA)   Asthma    Chest discomfort    in the past   Chest pain    in the past   History of hiatal hernia     Hypercholesterolemia    Hypertriglyceridemia    Osteoarthritis of left hip    PONV (postoperative nausea and vomiting)    Pre-diabetes    year ago was on metformin, then she has been of metforming for many years    Family History  Problem Relation Age of Onset   Coronary artery disease Father        15, several bypass   Dementia Father    Pancreatic cancer Mother     Past Surgical History:  Procedure Laterality Date   CARDIAC CATHETERIZATION     2008   CESAREAN SECTION  1986 and 1989   COLONOSCOPY     feet surgery  1990   four   herniated disc  1990 and 1992   two   LUMBAR LAMINECTOMY     rods screws, cadiver bone    TONSILLECTOMY     TOOTH EXTRACTION     TOTAL HIP ARTHROPLASTY Left 02/11/2018   TOTAL HIP ARTHROPLASTY Left 02/11/2018   Procedure: LEFT TOTAL HIP ARTHROPLASTY ANTERIOR APPROACH;  Surgeon: Kathryne Hitch, MD;  Location: [facility];  Service: Orthopedics;  Laterality: Left;   WISDOM TOOTH EXTRACTION     Social History   Occupational History   Not on file  Tobacco Use   Smoking status: Never   Smokeless tobacco: Never  Vaping Use   Vaping Use: Never used  Substance and Sexual Activity   Alcohol use: No   Drug use: No   Sexual activity: Not on file

## 2022-01-29 NOTE — Addendum Note (Signed)
Addended by: Barbette Or on: 01/29/2022 04:33 PM   Modules accepted: Orders

## 2022-02-26 ENCOUNTER — Ambulatory Visit (INDEPENDENT_AMBULATORY_CARE_PROVIDER_SITE_OTHER): Payer: PRIVATE HEALTH INSURANCE | Admitting: Physician Assistant

## 2022-02-26 DIAGNOSIS — M75121 Complete rotator cuff tear or rupture of right shoulder, not specified as traumatic: Secondary | ICD-10-CM | POA: Diagnosis not present

## 2022-02-26 DIAGNOSIS — M19012 Primary osteoarthritis, left shoulder: Secondary | ICD-10-CM

## 2022-02-26 NOTE — Progress Notes (Signed)
HPI: [name] returns today to go over the MRI of her left shoulder.  She states she continues to have severe left shoulder pain.  She is limited with overhead motion due to the shoulder pain.  She particularly has pain when bringing the arm down from overhead position.  She has had no new injury.  Again she did have a injury to the shoulder in early February of this year when she fell out of bed.  She states that shoulder pain is becoming quite debilitating. ? ?MRI images are reviewed.  MRI results reviewed with the patient.  She has a full-thickness supraspinatus tear with atrophy that is mild to moderate.  Moderate glenohumeral joint changes with high-grade partial to full-thickness cartilage loss involving the humeral head and high-grade partial and full-thickness loss involving the glenoid. ? ? ?Physical exam: Left shoulder abduction she has almost full overhead motion actively.  Forward flexion she is only able to come to 90 degrees actively.  Has difficulty bringing the arm back down to neutral after overhead activity in both directions of motion. ? ? ?Impression: Full-thickness left shoulder rotator cuff tear ?Left shoulder glenohumeral joint arthritis ? ?Plan: We will refer to Dr. August Saucer for further evaluation treatment of her left shoulder rotator cuff tear and glenohumeral arthritis.  Questions were encouraged and answered at length today. ?

## 2022-03-05 ENCOUNTER — Ambulatory Visit (INDEPENDENT_AMBULATORY_CARE_PROVIDER_SITE_OTHER): Payer: PRIVATE HEALTH INSURANCE | Admitting: Orthopedic Surgery

## 2022-03-05 DIAGNOSIS — M19012 Primary osteoarthritis, left shoulder: Secondary | ICD-10-CM

## 2022-03-08 ENCOUNTER — Encounter: Payer: Self-pay | Admitting: Orthopedic Surgery

## 2022-03-08 NOTE — Progress Notes (Signed)
? ?Office Visit Note ?  ?Patient: Ellen Sawyer           ?Date of Birth: [birth year]           ?MRN: [id number] ?Visit Date: 03/05/2022 ?Requested by: Aletha Halim., PA-C ?[phone] ?[place],  [place] [id number] ?PCP: Aletha Halim., PA-C ? ?Subjective: ?Chief Complaint  ?Patient presents with  ? Left Shoulder - Pain  ? ? ?HPI: Ellen Sawyer is a 65 year old patient with left shoulder pain.  She has had pain for about 8 weeks after she fell out of her bed.  She is right-hand dominant.  The pain is waking her from sleep at night.  No prior left shoulder surgery.  She reports decreased range of motion.  She has tried Ultram without relief.  She reports level 9 out of 10 pain.  Hard for her to sleep on that left-hand side.  She works as a Marine scientist at [employer].  She has had an MRI scan which was done at [facility].  That shows some atrophy on the sagittal views of the supraspinatus with what appears to be acute on chronic tearing of the rotator cuff.  Looks like there may be a component of mild to moderate arthritis in the glenohumeral joint as well.  She has had arthroscopy on the right side which did show some arthritis. ?             ?ROS: All systems reviewed are negative as they relate to the chief complaint within the history of present illness.  Patient denies  fevers or chills. ? ? ?Assessment & Plan: ?Visit Diagnoses:  ?1. Glenohumeral arthritis, left   ? ? ?Plan: Impression is left shoulder pain with acute on chronic rotator cuff tearing.  Was pretty asymptomatic until she fell out of bed.  She has tried and failed conservative treatment.  I think it is possible that the arthritis may be more severe than what it appears to be on the MRI scan.  Nonetheless the rotator cuff tear may be fully or at least partially repairable.  This could delay her need for reverse shoulder replacement in the future.  Plan at this time is arthroscopy with biceps tenodesis and rotator cuff tear  repair.  The risk benefits of the procedure discussed with the patient include not limited to infection nerve or vessel damage incomplete pain relief as well as incomplete restoration of function.  Extensive nature of the rehabilitative process is discussed.  We would use home CPM brace/machine for the first 2 weeks and then start physical therapy thereafter.  Patient understands risk and benefits and wishes to proceed.  All questions answered ? ?Follow-Up Instructions: No follow-ups on file.  ? ?Orders:  ?No orders of the defined types were placed in this encounter. ? ?No orders of the defined types were placed in this encounter. ? ? ? ? Procedures: ?No procedures performed ? ? ?Clinical Data: ?No additional findings. ? ?Objective: ?Vital Signs: There were no vitals taken for this visit. ? ?Physical Exam:  ? ?Constitutional: Patient appears well-developed ?HEENT:  ?Head: Normocephalic ?Eyes:EOM are normal ?Neck: Normal range of motion ?Cardiovascular: Normal rate ?Pulmonary/chest: Effort normal ?Neurologic: Patient is alert ?Skin: Skin is warm ?Psychiatric: Patient has normal mood and affect ? ? ?Ortho Exam: Ortho exam demonstrates good cervical spine range of motion.  Passive range of motion on the left is 30/105/170.  She does have weakness to infraspinatus and supraspinatus testing on the left.  [place]  strength is intact.  No discrete AC joint tenderness is present.  There is some crepitus with internal/external rotation of the left arm at 90 degrees of abduction. ? ?Specialty Comments:  ?No specialty comments available. ? ?Imaging: ?No results found. ? ? ?PMFS History: ?Patient Active Problem List  ? Diagnosis Date Noted  ? DOE (dyspnea on exertion) 09/08/2021  ? Cough variant asthma 09/08/2021  ? Status post total replacement of left hip 02/11/2018  ? Unilateral primary osteoarthritis, left hip 01/14/2018  ? Spondylolisthesis at L4-L5 level 09/09/2017  ? ?Past Medical History:  ?Diagnosis Date  ? Anxiety   ?  DULoxetine (CYMBALTA)  ? Asthma   ? Chest discomfort   ? in the past  ? Chest pain   ? in the past  ? History of hiatal hernia   ? Hypercholesterolemia   ? Hypertriglyceridemia   ? Osteoarthritis of left hip   ? PONV (postoperative nausea and vomiting)   ? Pre-diabetes   ? year ago was on metformin, then she has been of metforming for many years  ?  ?Family History  ?Problem Relation Age of Onset  ? Coronary artery disease Father   ?     1970, several bypass  ? Dementia Father   ? Pancreatic cancer Mother   ?  ?Past Surgical History:  ?Procedure Laterality Date  ? CARDIAC CATHETERIZATION    ? 2008  ? [place]  ? COLONOSCOPY    ? feet surgery  1990  ? four  ? herniated disc  1990 and 1992  ? two  ? LUMBAR LAMINECTOMY    ? rods screws, cadiver bone   ? TONSILLECTOMY    ? TOOTH EXTRACTION    ? TOTAL HIP ARTHROPLASTY Left 02/11/2018  ? TOTAL HIP ARTHROPLASTY Left 02/11/2018  ? Procedure: LEFT TOTAL HIP ARTHROPLASTY ANTERIOR APPROACH;  Surgeon: Mcarthur Rossetti, MD;  Location: [facility];  Service: Orthopedics;  Laterality: Left;  ? WISDOM TOOTH EXTRACTION    ? ?Social History  ? ?Occupational History  ? Not on file  ?Tobacco Use  ? Smoking status: Never  ? Smokeless tobacco: Never  ?Vaping Use  ? Vaping Use: Never used  ?Substance and Sexual Activity  ? Alcohol use: No  ? Drug use: No  ? Sexual activity: Not on file  ? ? ? ? ? ?

## 2022-03-16 ENCOUNTER — Telehealth: Payer: Self-pay | Admitting: Orthopedic Surgery

## 2022-03-16 NOTE — Telephone Encounter (Signed)
Spoke with patient-FMLA forms faxed to her at [phone] per her request. ?

## 2022-03-19 ENCOUNTER — Other Ambulatory Visit: Payer: Self-pay | Admitting: Surgical

## 2022-03-19 ENCOUNTER — Encounter: Payer: Self-pay | Admitting: Orthopedic Surgery

## 2022-03-19 DIAGNOSIS — M75122 Complete rotator cuff tear or rupture of left shoulder, not specified as traumatic: Secondary | ICD-10-CM | POA: Diagnosis not present

## 2022-03-19 DIAGNOSIS — M7522 Bicipital tendinitis, left shoulder: Secondary | ICD-10-CM | POA: Diagnosis not present

## 2022-03-19 DIAGNOSIS — M67312 Transient synovitis, left shoulder: Secondary | ICD-10-CM | POA: Diagnosis not present

## 2022-03-19 MED ORDER — OXYCODONE-ACETAMINOPHEN 5-325 MG PO TABS: 1.0000 | ORAL_TABLET | ORAL | 0 refills | Status: DC | PRN

## 2022-03-19 MED ORDER — METHOCARBAMOL 500 MG PO TABS: 500.0000 mg | ORAL_TABLET | Freq: Three times a day (TID) | ORAL | 0 refills | Status: DC | PRN

## 2022-03-19 MED ORDER — CELECOXIB 100 MG PO CAPS: 100.0000 mg | ORAL_CAPSULE | Freq: Two times a day (BID) | ORAL | 0 refills | Status: AC

## 2022-03-21 ENCOUNTER — Telehealth: Payer: Self-pay | Admitting: Surgical

## 2022-03-21 NOTE — Telephone Encounter (Signed)
Pt called requesting a call back from Lauren F. Pt states she had surgery last week and Dr. August Saucer ordered CPM machine. Pt states her insurance company denied it and stated they need a medical necessity to be sent to the claims department for her insurance [employer]. Pt states her member ID number is [id number] and there address [place] [address] [address] [facility] [place] [address]. Pt did not provide phone number to [employer]. Please call pt about this matter at [phone]. ?

## 2022-03-21 NOTE — Telephone Encounter (Signed)
Ellen Sawyer is a 66 year old patient with rotator cuff repair surgery performed a week ago.  She had an extensive tear which required many suture anchors.  I would not want her to start physical therapy at this time because of the wrist to the repair.  However in my 23 years of experience passive motion with CPM machine is very effective at maintaining shoulder motion without risking injury to the repair.  In my opinion it is a medical necessity that Ellen Sawyer is able to use either a CPM brace or CPM machine to facilitate passive range of motion to prevent her from developing a frozen shoulder which would require more surgery down the road and would be a more expensive alternative than just using a CPM machine for the first 2 to 3 weeks after surgery.  If you have any questions please not hesitate to call.  Thank you

## 2022-03-22 ENCOUNTER — Telehealth: Payer: Self-pay | Admitting: Orthopedic Surgery

## 2022-03-22 NOTE — Telephone Encounter (Signed)
Note written  ?Patient will call back with fax number instead of mailing it ?

## 2022-03-22 NOTE — Telephone Encounter (Signed)
Faxed  [phone] ?

## 2022-03-22 NOTE — Telephone Encounter (Signed)
Faxed note.

## 2022-03-22 NOTE — Telephone Encounter (Signed)
Patient called to give fax# to Lauren    The fax# is [phone] ?

## 2022-03-26 ENCOUNTER — Ambulatory Visit (INDEPENDENT_AMBULATORY_CARE_PROVIDER_SITE_OTHER): Payer: PRIVATE HEALTH INSURANCE | Admitting: Surgical

## 2022-03-26 ENCOUNTER — Encounter: Payer: Self-pay | Admitting: Surgical

## 2022-03-26 DIAGNOSIS — M75122 Complete rotator cuff tear or rupture of left shoulder, not specified as traumatic: Secondary | ICD-10-CM

## 2022-03-26 NOTE — Progress Notes (Signed)
? ?Post-Op Visit Note ?  ?Patient: Ellen Sawyer           ?Date of Birth: May 06, 1956           ?MRN: [id number] ?Visit Date: 03/26/2022 ?PCP: Aletha Halim., PA-C ? ? ?Assessment & Plan: ? ?Chief Complaint:  ?Chief Complaint  ?Patient presents with  ? Left Shoulder - Routine Post Op  ?  03/19/22 left shoulder scope; DOA; deb; MORCR with BT  ? ?Visit Diagnoses:  ?1. Complete tear of left rotator cuff, unspecified whether traumatic   ? ? ?Plan: Patient is a 66 year old female who presents s/p left shoulder arthroscopy with debridement, mini open rotator cuff tear repair, biceps tenodesis.  Rotator cuff was only able to be partially repaired.  She is using CPM machine 1.5 hours 3 times per day.  Up to 62 degrees.  Not having to take but 1 dose of pain medication throughout the day and only takes another pain medication before going to bed along with muscle relaxer.  Denies any fevers, chills, night sweats, chest pain, shortness of breath.  Block is fully worn off. ? ?On exam, patient has 20 degrees external rotation, 80 degrees abduction, 90 degrees forward flexion passively.  Axillary nerve is intact with deltoid firing.  Intact EPL, FPL, finger abduction, finger adduction, pronation/supination, bicep, tricep, deltoid.  Incisions are healing well without evidence of infection or dehiscence.  Sutures removed and replaced with Steri-Strips. ? ?Plan is continue with CPM machine.  Use sling at all times.  No active range of motion of the operative arm at the shoulder and no lifting with the operative arm.  Follow-up in 2 weeks for clinical recheck with initiation of physical therapy. ? ?Follow-Up Instructions: No follow-ups on file.  ? ?Orders:  ?No orders of the defined types were placed in this encounter. ? ?No orders of the defined types were placed in this encounter. ? ? ?Imaging: ?No results found. ? ?PMFS History: ?Patient Active Problem List  ? Diagnosis Date Noted  ? DOE (dyspnea on exertion) 09/08/2021   ? Cough variant asthma 09/08/2021  ? Status post total replacement of left hip 02/11/2018  ? Unilateral primary osteoarthritis, left hip 01/14/2018  ? Spondylolisthesis at L4-L5 level 09/09/2017  ? ?Past Medical History:  ?Diagnosis Date  ? Anxiety   ? DULoxetine (CYMBALTA)  ? Asthma   ? Chest discomfort   ? in the past  ? Chest pain   ? in the past  ? History of hiatal hernia   ? Hypercholesterolemia   ? Hypertriglyceridemia   ? Osteoarthritis of left hip   ? PONV (postoperative nausea and vomiting)   ? Pre-diabetes   ? year ago was on metformin, then she has been of metforming for many years  ?  ?Family History  ?Problem Relation Age of Onset  ? Coronary artery disease Father   ?     1970, several bypass  ? Dementia Father   ? Pancreatic cancer Mother   ?  ?Past Surgical History:  ?Procedure Laterality Date  ? CARDIAC CATHETERIZATION    ? 2008  ? [place]  ? COLONOSCOPY    ? feet surgery  1990  ? four  ? herniated disc  1990 and 1992  ? two  ? LUMBAR LAMINECTOMY    ? rods screws, cadiver bone   ? TONSILLECTOMY    ? TOOTH EXTRACTION    ? TOTAL HIP ARTHROPLASTY Left 02/11/2018  ? TOTAL HIP  ARTHROPLASTY Left 02/11/2018  ? Procedure: LEFT TOTAL HIP ARTHROPLASTY ANTERIOR APPROACH;  Surgeon: Mcarthur Rossetti, MD;  Location: [facility];  Service: Orthopedics;  Laterality: Left;  ? WISDOM TOOTH EXTRACTION    ? ?Social History  ? ?Occupational History  ? Not on file  ?Tobacco Use  ? Smoking status: Never  ? Smokeless tobacco: Never  ?Vaping Use  ? Vaping Use: Never used  ?Substance and Sexual Activity  ? Alcohol use: No  ? Drug use: No  ? Sexual activity: Not on file  ? ? ? ?

## 2022-04-13 ENCOUNTER — Ambulatory Visit: Payer: PRIVATE HEALTH INSURANCE | Admitting: Surgical

## 2022-04-16 ENCOUNTER — Encounter: Payer: Self-pay | Admitting: Orthopedic Surgery

## 2022-04-16 ENCOUNTER — Ambulatory Visit (INDEPENDENT_AMBULATORY_CARE_PROVIDER_SITE_OTHER): Payer: PRIVATE HEALTH INSURANCE | Admitting: Surgical

## 2022-04-16 DIAGNOSIS — M75122 Complete rotator cuff tear or rupture of left shoulder, not specified as traumatic: Secondary | ICD-10-CM

## 2022-04-16 MED ORDER — METHOCARBAMOL 500 MG PO TABS: 500.0000 mg | ORAL_TABLET | Freq: Three times a day (TID) | ORAL | 0 refills | Status: DC | PRN

## 2022-04-16 NOTE — Progress Notes (Signed)
Post-Op Visit Note   Patient: Ellen Sawyer           Date of Birth: Mar 24, 1956           MRN: [id number] Visit Date: 04/16/2022 PCP: Aletha Halim., PA-C   Assessment & Plan:  Chief Complaint:  Chief Complaint  Patient presents with   Left Shoulder - Routine Post Op   Visit Diagnoses:  1. Complete tear of left rotator cuff, unspecified whether traumatic     Plan: Ellen Sawyer is a 66 y.o. female who presents s/p left shoulder rotator cuff repair and biceps tenodesis on 03/19/2022.  Patient is doing well and pain is overall controlled.  Up to 90 degrees on CPM machine.  Denies any chest pain, SOB, fevers, chills. Taking pain medication , just half a tablet at night to help with sleeping.  Otherwise does not take oxycodone and just takes Robaxin.  On exam, patient has range of motion 25/90/140.  Intact EPL, FPL, finger abduction, finger adduction, pronation/supination, bicep, tricep, deltoid of operative extremity.  Axillary nerve intact with deltoid firing.  Incisions are healing well without evidence of infection or dehiscence. 2+ radial pulse of the operative extremity  Plan is continue with CPM machine.  Set her up with physical therapy upstairs.  Cautioned her against any active range of motion of the left shoulder or any lifting with the left shoulder.  She may start to do some full active range of motion in about 2 weeks which would mark 6 weeks out from surgery.  Okay for rotator cuff strengthening exercises in 2 weeks.  Until then, just stick with the CPM machine for passive range of motion and she may do a little bit of active assisted range of motion as well.  Follow-up on or around June 15 to determine if she is okay to return to work in some capacity on June 17.  Next appointment must be with Dr. Marlou Sa.  Now that she is about a month out from surgery, she can discontinue the sling at home but recommended she continue using the sling when she is out and about such  as at the grocery store or at a friend's house..   Follow-Up Instructions: No follow-ups on file.   Orders:  No orders of the defined types were placed in this encounter.  No orders of the defined types were placed in this encounter.   Imaging: No results found.  PMFS History: Patient Active Problem List   Diagnosis Date Noted   DOE (dyspnea on exertion) 09/08/2021   Cough variant asthma 09/08/2021   Status post total replacement of left hip 02/11/2018   Unilateral primary osteoarthritis, left hip 01/14/2018   Spondylolisthesis at L4-L5 level 09/09/2017   Past Medical History:  Diagnosis Date   Anxiety    DULoxetine (CYMBALTA)   Asthma    Chest discomfort    in the past   Chest pain    in the past   History of hiatal hernia    Hypercholesterolemia    Hypertriglyceridemia    Osteoarthritis of left hip    PONV (postoperative nausea and vomiting)    Pre-diabetes    year ago was on metformin, then she has been of metforming for many years    Family History  Problem Relation Age of Onset   Coronary artery disease Father        41, several bypass   Dementia Father    Pancreatic cancer Mother  Past Surgical History:  Procedure Laterality Date   CARDIAC CATHETERIZATION     2008   CESAREAN SECTION  1986 and 1989   COLONOSCOPY     feet surgery  1990   four   herniated disc  1990 and 1992   two   LUMBAR LAMINECTOMY     rods screws, cadiver bone    TONSILLECTOMY     TOOTH EXTRACTION     TOTAL HIP ARTHROPLASTY Left 02/11/2018   TOTAL HIP ARTHROPLASTY Left 02/11/2018   Procedure: LEFT TOTAL HIP ARTHROPLASTY ANTERIOR APPROACH;  Surgeon: Mcarthur Rossetti, MD;  Location: [facility];  Service: Orthopedics;  Laterality: Left;   WISDOM TOOTH EXTRACTION     Social History   Occupational History   Not on file  Tobacco Use   Smoking status: Never   Smokeless tobacco: Never  Vaping Use   Vaping Use: Never used  Substance and Sexual Activity   Alcohol use: No    Drug use: No   Sexual activity: Not on file

## 2022-04-17 ENCOUNTER — Other Ambulatory Visit: Payer: Self-pay

## 2022-04-17 DIAGNOSIS — M75122 Complete rotator cuff tear or rupture of left shoulder, not specified as traumatic: Secondary | ICD-10-CM

## 2022-04-20 ENCOUNTER — Other Ambulatory Visit: Payer: Self-pay

## 2022-04-20 ENCOUNTER — Ambulatory Visit (INDEPENDENT_AMBULATORY_CARE_PROVIDER_SITE_OTHER): Payer: PRIVATE HEALTH INSURANCE | Admitting: Rehabilitative and Restorative Service Providers"

## 2022-04-20 ENCOUNTER — Encounter: Payer: Self-pay | Admitting: Rehabilitative and Restorative Service Providers"

## 2022-04-20 DIAGNOSIS — M25512 Pain in left shoulder: Secondary | ICD-10-CM

## 2022-04-20 DIAGNOSIS — R600 Localized edema: Secondary | ICD-10-CM | POA: Diagnosis not present

## 2022-04-20 DIAGNOSIS — M6281 Muscle weakness (generalized): Secondary | ICD-10-CM | POA: Diagnosis not present

## 2022-04-20 DIAGNOSIS — G8929 Other chronic pain: Secondary | ICD-10-CM

## 2022-04-20 NOTE — Therapy (Signed)
OUTPATIENT PHYSICAL THERAPY EVALUATION   Patient Name: Ellen Sawyer MRN: [id number] DOB:Oct 09, 1956, 66 y.o., female Today's Date: 04/20/2022   PT End of Session - 04/20/22 1200     Visit Number 1    Number of Visits 20    Date for PT Re-Evaluation 06/29/22    Authorization Type [employer]    PT Start Time 1150    PT Stop Time 1223    PT Time Calculation (min) 33 min    Activity Tolerance Patient tolerated treatment well    Behavior During Therapy WFL for tasks assessed/performed             Past Medical History:  Diagnosis Date   Anxiety    DULoxetine (CYMBALTA)   Asthma    Chest discomfort    in the past   Chest pain    in the past   History of hiatal hernia    Hypercholesterolemia    Hypertriglyceridemia    Osteoarthritis of left hip    PONV (postoperative nausea and vomiting)    Pre-diabetes    year ago was on metformin, then she has been of metforming for many years   Past Surgical History:  Procedure Laterality Date   CARDIAC CATHETERIZATION     2008   [place] and 1989   COLONOSCOPY     feet surgery  1990   four   herniated disc  1990 and 1992   two   LUMBAR LAMINECTOMY     rods screws, cadiver bone    TONSILLECTOMY     TOOTH EXTRACTION     TOTAL HIP ARTHROPLASTY Left 02/11/2018   TOTAL HIP ARTHROPLASTY Left 02/11/2018   Procedure: LEFT TOTAL HIP ARTHROPLASTY ANTERIOR APPROACH;  Surgeon: Mcarthur Rossetti, MD;  Location: [facility];  Service: Orthopedics;  Laterality: Left;   WISDOM TOOTH EXTRACTION     Patient Active Problem List   Diagnosis Date Noted   DOE (dyspnea on exertion) 09/08/2021   Cough variant asthma 09/08/2021   Status post total replacement of left hip 02/11/2018   Unilateral primary osteoarthritis, left hip 01/14/2018   Spondylolisthesis at L4-L5 level 09/09/2017    PCP: Aletha Halim., PA-C  REFERRING PROVIDER: Donella Stade, PA-C  REFERRING DIAG: [phone] (ICD-10-CM) - Complete tear of left  rotator cuff, unspecified whether traumatic  THERAPY DIAG:  Chronic left shoulder pain  Muscle weakness (generalized)  Localized edema  Rationale for Evaluation and Treatment Rehabilitation  ONSET DATE: Surgery 03/19/2022  SUBJECTIVE:  SUBJECTIVE STATEMENT: Left shoulder rotator cuff repair and biceps tenodesis on 03/19/2022.  Pt indicated initial injury happened in March when falling off bed.  Pt indicated having difficulty moving arm with pain leading to surgery.  Pt indicated difficulty sleeping since surgery.  Now sleeping in bed.  Taking medicine.   PERTINENT HISTORY: Hypercholesterolemia, Pre-diabetes, hypertricglyceridemia, OA Lt hip.  History of Rt shoulder rotator cuff repair (a couple years per Pt. ).   PAIN:  NPRS scale: current 2-3/10, at worst 6/10 Pain location: Lt shoulder Pain description: sharp, achy Aggravating factors: using arm at all, sleeping Relieving factors: medicine, ice  PRECAUTIONS: Shoulder - PROM/AAROM until 05/03/2022, then AROM and strengthening (per referral)  WEIGHT BEARING RESTRICTIONS Yes Shoulder Lt  FALLS:  Has patient fallen in last 6 months? 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  PLOF: Independent , Gardening, mowing.  Rt hand dominant  PATIENT GOALS Reduce pain  OBJECTIVE:   PATIENT SURVEYS:  04/20/2022: FOTO intake:  29  predicted:  91  COGNITION: 04/20/2022: Overall cognitive status: Within functional limits for tasks assessed     SENSATION: 04/20/2022 WFL  POSTURE: 04/20/2022 : Arm sling.   UPPER EXTREMITY ROM:   ROM Right 04/20/2022 Left 04/20/2022 PROM in SUPINE Pain limited all directions  Shoulder flexion  120  Shoulder extension    Shoulder abduction  85  Shoulder adduction     Shoulder internal rotation  40 in 30 deg abduction  Shoulder external rotation  45 in 30 deg abduction  Elbow flexion    Elbow extension    Wrist flexion    Wrist extension    Wrist ulnar deviation    Wrist radial deviation    Wrist pronation    Wrist supination    (Blank rows = not tested)  UPPER EXTREMITY MMT:  MMT Right 04/20/2022 Left 04/20/2022 Not tested today due to surgical protocol  Shoulder flexion 45   Shoulder extension    Shoulder abduction 3+/5   Shoulder adduction    Shoulder internal rotation 5/5   Shoulder external rotation 4/5   Middle trapezius    Lower trapezius    Elbow flexion 5/5   Elbow extension 5/5   Wrist flexion    Wrist extension    Wrist ulnar deviation    Wrist radial deviation    Wrist pronation    Wrist supination    Grip strength (lbs)    (Blank rows = not tested)  JOINT MOBILITY TESTING:  04/20/2022: not specific limitation in mid range Lt GH joint  PALPATION:  04/20/2022 : General tenderness Lt shoulder region    TODAY'S TREATMENT:  04/20/2022   Therex:    HEP instruction/performance c cues for techniques, handout provided.  Trial set performed of each for comprehension and symptom assessment.  See below for exercise list.    Manual:    G2 inferior jt mobs for pain relief, PROM   PATIENT EDUCATION: 04/20/2022 Education details: HEP, POC, precautions from surgery (no active movement, strengthening) Person educated: Patient Education method: Explanation, Demonstration, Verbal cues, and Handouts Education comprehension: verbalized understanding, returned demonstration, and verbal cues required   HOME EXERCISE PROGRAM: 04/20/2022 Access Code: [email or web address] URL: [email or web address] Date: 04/20/2022 Prepared by: Scot Jun  Exercises - Supine Shoulder Flexion AAROM with Hands Clasped  - 2-3 x daily - 7 x weekly - 1-2 sets - 10 reps - 5 hold - Supine Shoulder External Rotation with Dowel  -  2-3 x daily - 7 x  weekly - 1-2 sets - 10 reps - 5 hold - Seated Scapular Retraction  - 2-3 x daily - 7 x weekly - 1-2 sets - 10 reps - 5 hold - Standing Circular Shoulder Pendulum Supported with Arm Bent (Mirrored)  - 2-3 x daily - 7 x weekly - 1-2 sets - 10 reps - Standing Flexion Extension Shoulder Pendulum Supported with Arm Bent (Mirrored)  - 2-3 x daily - 7 x weekly - 1-2 sets - 10 reps  ASSESSMENT:  CLINICAL IMPRESSION: Patient is a 66 y.o. who comes to clinic with complaints of Lt shoulder pain s/p recent rotator cuff repair c biceps tenodesis on 03/19/2022 with mobility, strength and movement coordination deficits that impair their ability to perform usual daily and recreational functional activities without increase difficulty/symptoms at this time.  Patient to benefit from skilled PT services to address impairments and limitations to improve to previous level of function without restriction secondary to condition.    OBJECTIVE IMPAIRMENTS decreased activity tolerance, decreased coordination, decreased endurance, decreased mobility, decreased ROM, decreased strength, hypomobility, increased edema, impaired perceived functional ability, increased muscle spasms, impaired flexibility, impaired UE functional use, improper body mechanics, postural dysfunction, and pain.   ACTIVITY LIMITATIONS meal prep, cleaning, laundry, interpersonal relationship, driving, shopping, community activity, occupation, and yard work.   PERSONAL FACTORS Hypercholesterolemia, Pre-diabetes, hypertricglyceridemia, OA Lt hip are also affecting patient's functional outcome.    REHAB POTENTIAL: Good  CLINICAL DECISION MAKING: Stable/uncomplicated  EVALUATION COMPLEXITY: Low   GOALS: Goals reviewed with patient? Yes  Eval Date;  04/20/2022  Short term PT Goals (target date for Short term goals are 3 weeks 05/11/2022) Patient will demonstrate independent use of home exercise program to maintain progress from in clinic  treatments. Goal status: New   Long term PT goals (target dates for all long term goals are 10 weeks  06/29/2022 )  1. Patient will demonstrate/report pain at worst less than or equal to 2/10 to facilitate minimal limitation in daily activity secondary to pain symptoms. Goal status: New  2. Patient will demonstrate independent use of home exercise program to facilitate ability to maintain/progress functional gains from skilled physical therapy services. Goal status: New  3. Patient will demonstrate FOTO outcome > or = 61 % to indicate reduced disability due to condition. Goal status: New  4.  Patient will demonstrate Lt  [facility] joint mobility WFL to facilitate usual self care, dressing, reaching overhead at PLOF s limitation due to symptoms.  Goal status: New  5.  Patient will demonstrate Lt UE MMT 4/5 or greater throughout to facilitate usual lifting, carrying in functional activity to PLOF s limitation.   Goal status: New  6.  Patient will demonstrate/report ability to return to work at [employer].   Goal status: New    PLAN: PT FREQUENCY: 1-2x/week  PT DURATION: 10 weeks  PLANNED INTERVENTIONS: Therapeutic exercises, Therapeutic activity, Neuro Muscular re-education, Balance training, Gait training, Patient/Family education, Joint mobilization, Stair training, DME instructions, Dry Needling, Electrical stimulation, Cryotherapy, Moist heat, Taping, Ultrasound, Ionotophoresis 4mg /ml Dexamethasone, and Manual therapy.  All included unless contraindicated  PLAN FOR NEXT SESSION: Review HEP.  PROM/AAROM only until 05/03/2022  Scot Jun, PT, DPT, OCS, ATC 04/20/22  12:37 PM

## 2022-04-21 ENCOUNTER — Telehealth: Payer: Self-pay | Admitting: Orthopedic Surgery

## 2022-04-21 NOTE — Telephone Encounter (Signed)
I called lmom

## 2022-04-24 ENCOUNTER — Ambulatory Visit (INDEPENDENT_AMBULATORY_CARE_PROVIDER_SITE_OTHER): Payer: PRIVATE HEALTH INSURANCE | Admitting: Rehabilitative and Restorative Service Providers"

## 2022-04-24 ENCOUNTER — Encounter: Payer: Self-pay | Admitting: Rehabilitative and Restorative Service Providers"

## 2022-04-24 DIAGNOSIS — M25512 Pain in left shoulder: Secondary | ICD-10-CM | POA: Diagnosis not present

## 2022-04-24 DIAGNOSIS — G8929 Other chronic pain: Secondary | ICD-10-CM | POA: Diagnosis not present

## 2022-04-24 DIAGNOSIS — M6281 Muscle weakness (generalized): Secondary | ICD-10-CM

## 2022-04-24 DIAGNOSIS — R600 Localized edema: Secondary | ICD-10-CM | POA: Diagnosis not present

## 2022-04-24 NOTE — Therapy (Signed)
OUTPATIENT PHYSICAL THERAPY TREATMENT NOTE   Patient Name: Ellen Sawyer MRN: [id number] DOB:Nov 24, 1956, 66 y.o., female Today's Date: 04/24/2022   END OF SESSION:   PT End of Session - 04/24/22 3016     Visit Number 2    Number of Visits 20    Date for PT Re-Evaluation 06/29/22    Authorization Type [employer]    PT Start Time 0918    PT Stop Time 1006    PT Time Calculation (min) 48 min    Activity Tolerance Patient tolerated treatment well    Behavior During Therapy WFL for tasks assessed/performed             Past Medical History:  Diagnosis Date   Anxiety    DULoxetine (CYMBALTA)   Asthma    Chest discomfort    in the past   Chest pain    in the past   History of hiatal hernia    Hypercholesterolemia    Hypertriglyceridemia    Osteoarthritis of left hip    PONV (postoperative nausea and vomiting)    Pre-diabetes    year ago was on metformin, then she has been of metforming for many years   Past Surgical History:  Procedure Laterality Date   CARDIAC CATHETERIZATION     2008   CESAREAN SECTION  1986 and 1989   COLONOSCOPY     feet surgery  1990   four   herniated disc  1990 and 1992   two   LUMBAR LAMINECTOMY     rods screws, cadiver bone    TONSILLECTOMY     TOOTH EXTRACTION     TOTAL HIP ARTHROPLASTY Left 02/11/2018   TOTAL HIP ARTHROPLASTY Left 02/11/2018   Procedure: LEFT TOTAL HIP ARTHROPLASTY ANTERIOR APPROACH;  Surgeon: Kathryne Hitch, MD;  Location: [facility];  Service: Orthopedics;  Laterality: Left;   WISDOM TOOTH EXTRACTION     Patient Active Problem List   Diagnosis Date Noted   DOE (dyspnea on exertion) 09/08/2021   Cough variant asthma 09/08/2021   Status post total replacement of left hip 02/11/2018   Unilateral primary osteoarthritis, left hip 01/14/2018   Spondylolisthesis at L4-L5 level 09/09/2017    PCP: Richmond Campbell., PA-C   REFERRING PROVIDER: Julieanne Cotton, PA-C   REFERRING DIAG: [phone] (ICD-10-CM)  - Complete tear of left rotator cuff, unspecified whether traumatic  ONSET DATE: Surgery 03/19/2022  THERAPY DIAG:  Chronic left shoulder pain  Muscle weakness (generalized)  Localized edema  Rationale for Evaluation and Treatment Rehabilitation  PERTINENT HISTORY: Hypercholesterolemia, Pre-diabetes, hypertricglyceridemia, OA Lt hip.  History of Rt shoulder rotator cuff repair (a couple years per Pt. ).   PRECAUTIONS: Shoulder - PROM/AAROM until 05/03/2022, then AROM and strengthening (per referral)  SUBJECTIVE: Pt indicated arm can feel stiff at times.  Pt indicated doing the HEP.    PAIN:  NPRS scale: current : 0/10 pain 2/10 soreness reported Pain location: Lt shoulder Pain description: sharp, achy Aggravating factors: using arm at all, sleeping Relieving factors: medicine, ice   OBJECTIVE:    PATIENT SURVEYS:  04/20/2022: FOTO intake:  29  predicted:  91   COGNITION: 04/20/2022: Overall cognitive status: Within functional limits for tasks assessed                                     SENSATION: 04/20/2022 WFL   POSTURE: 04/20/2022 : Arm sling.  UPPER EXTREMITY ROM:    ROM Right 04/20/2022 Left 04/20/2022 PROM in SUPINE Pain limited all directions  Shoulder flexion   120  Shoulder extension      Shoulder abduction   85  Shoulder adduction      Shoulder internal rotation   40 in 30 deg abduction  Shoulder external rotation   45 in 30 deg abduction  Elbow flexion      Elbow extension      Wrist flexion      Wrist extension      Wrist ulnar deviation      Wrist radial deviation      Wrist pronation      Wrist supination      (Blank rows = not tested)   UPPER EXTREMITY MMT:   MMT Right 04/20/2022 Left 04/20/2022 Not tested today due to surgical protocol  Shoulder flexion 45    Shoulder extension      Shoulder abduction 3+/5    Shoulder adduction      Shoulder internal rotation 5/5    Shoulder external rotation 4/5    Middle trapezius      Lower  trapezius      Elbow flexion 5/5    Elbow extension 5/5    Wrist flexion      Wrist extension      Wrist ulnar deviation      Wrist radial deviation      Wrist pronation      Wrist supination      Grip strength (lbs)      (Blank rows = not tested)   JOINT MOBILITY TESTING:  04/20/2022: not specific limitation in mid range Lt GH joint   PALPATION:  04/20/2022 : General tenderness Lt shoulder region               TODAY'S TREATMENT:  04/24/2022             Therex:                         UBE AAROM fwd/back 3 mins each way   Pulleys flexion, scaption 2 mins each way   Supine AAROM c wand c Rt hand close to Lt hand (4 inches) 2 x 10   PROM Lt elbow x 10 (cues for home use)   Standing AAROM abduction Lt shoulder to tolerance x 10 (not for home yet due to difficulty)   Seated scapular retraction 5 sec hold x 10                Manual:                         G2 Lt shoulder inferior jt mobs for pain relief, PROM all directions.    Vasopneumatic   Lt shoulder in sitting 34 degrees low compression  04/20/2022             Therex:                         HEP instruction/performance c cues for techniques, handout provided.  Trial set performed of each for comprehension and symptom assessment.  See below for exercise list.               Manual:                         G2 inferior  jt mobs for pain relief, PROM     PATIENT EDUCATION: 04/24/2022 Education details: HEP progression Person educated: Patient Education method: Programmer, multimedia, Facilities manager, Verbal cues, and Handouts Education comprehension: verbalized understanding, returned demonstration, and verbal cues required     HOME EXERCISE PROGRAM: Access Code: [email or web address] URL: [email or web address] Date: 04/24/2022 Prepared by: Chyrel Masson  Exercises - Supine Shoulder Flexion AAROM with Hands Clasped  - 2-3 x daily - 7 x weekly - 1-2 sets - 10 reps - 5 hold - Supine Shoulder External Rotation with Dowel  - 2-3 x  daily - 7 x weekly - 1-2 sets - 10 reps - 5 hold - Seated Scapular Retraction  - 2-3 x daily - 7 x weekly - 1-2 sets - 10 reps - 5 hold - Standing Circular Shoulder Pendulum Supported with Arm Bent (Mirrored)  - 2-3 x daily - 7 x weekly - 1-2 sets - 10 reps - Standing Flexion Extension Shoulder Pendulum Supported with Arm Bent (Mirrored)  - 2-3 x daily - 7 x weekly - 1-2 sets - 10 reps - Supine Shoulder Flexion Extension AAROM with Dowel  - 2-3 x daily - 7 x weekly - 1-2 sets - 10 reps - 5 hold   ASSESSMENT:   CLINICAL IMPRESSION: Overall passive mobility showing well with end range primarily limited due to pain at this time.  AAROM introduction performed today c fair to good response overall in flexion.  Difficulty c abduction movement (held from HEP at this time).  Continued skilled PT services warranted at this time.      OBJECTIVE IMPAIRMENTS decreased activity tolerance, decreased coordination, decreased endurance, decreased mobility, decreased ROM, decreased strength, hypomobility, increased edema, impaired perceived functional ability, increased muscle spasms, impaired flexibility, impaired UE functional use, improper body mechanics, postural dysfunction, and pain.    ACTIVITY LIMITATIONS meal prep, cleaning, laundry, interpersonal relationship, driving, shopping, community activity, occupation, and yard work.    PERSONAL FACTORS Hypercholesterolemia, Pre-diabetes, hypertricglyceridemia, OA Lt hip are also affecting patient's functional outcome.      REHAB POTENTIAL: Good   CLINICAL DECISION MAKING: Stable/uncomplicated   EVALUATION COMPLEXITY: Low     GOALS: Goals reviewed with patient? Yes   Eval Date;  04/20/2022   Short term PT Goals (target date for Short term goals are 3 weeks 05/11/2022) Patient will demonstrate independent use of home exercise program to maintain progress from in clinic treatments. Goal status: on going - assessed 04/24/2022   Long term PT goals (target  dates for all long term goals are 10 weeks  06/29/2022 )   1. Patient will demonstrate/report pain at worst less than or equal to 2/10 to facilitate minimal limitation in daily activity secondary to pain symptoms. Goal status: New   2. Patient will demonstrate independent use of home exercise program to facilitate ability to maintain/progress functional gains from skilled physical therapy services. Goal status: New   3. Patient will demonstrate FOTO outcome > or = 61 % to indicate reduced disability due to condition. Goal status: New   4.  Patient will demonstrate Lt  [facility] joint mobility WFL to facilitate usual self care, dressing, reaching overhead at PLOF s limitation due to symptoms.  Goal status: New   5.  Patient will demonstrate Lt UE MMT 4/5 or greater throughout to facilitate usual lifting, carrying in functional activity to PLOF s limitation.   Goal status: New   6.  Patient will demonstrate/report ability to return to work at [employer].   Goal status: New  PLAN: PT FREQUENCY: 1-2x/week   PT DURATION: 10 weeks   PLANNED INTERVENTIONS: Therapeutic exercises, Therapeutic activity, Neuro Muscular re-education, Balance training, Gait training, Patient/Family education, Joint mobilization, Stair training, DME instructions, Dry Needling, Electrical stimulation, Cryotherapy, Moist heat, Taping, Ultrasound, Ionotophoresis 4mg /ml Dexamethasone, and Manual therapy.  All included unless contraindicated   PLAN FOR NEXT SESSION: PROM/AAROM only until 05/03/2022.  Vaso as desired.   07/03/2022, PT, DPT, OCS, ATC 04/24/22  9:58 AM

## 2022-04-27 ENCOUNTER — Ambulatory Visit (INDEPENDENT_AMBULATORY_CARE_PROVIDER_SITE_OTHER): Payer: PRIVATE HEALTH INSURANCE | Admitting: Rehabilitative and Restorative Service Providers"

## 2022-04-27 ENCOUNTER — Encounter: Payer: Self-pay | Admitting: Rehabilitative and Restorative Service Providers"

## 2022-04-27 DIAGNOSIS — G8929 Other chronic pain: Secondary | ICD-10-CM

## 2022-04-27 DIAGNOSIS — M25512 Pain in left shoulder: Secondary | ICD-10-CM | POA: Diagnosis not present

## 2022-04-27 DIAGNOSIS — M6281 Muscle weakness (generalized): Secondary | ICD-10-CM

## 2022-04-27 DIAGNOSIS — R600 Localized edema: Secondary | ICD-10-CM | POA: Diagnosis not present

## 2022-04-27 NOTE — Therapy (Signed)
OUTPATIENT PHYSICAL THERAPY TREATMENT NOTE   Patient Name: Ellen Sawyer MRN: [id number] DOB:1956/05/10, 66 y.o., female Today's Date: 04/27/2022   END OF SESSION:   PT End of Session - 04/27/22 1253     Visit Number 3    Number of Visits 20    Date for PT Re-Evaluation 06/29/22    Authorization Type [employer]    PT Start Time 1254    PT Stop Time 1333    PT Time Calculation (min) 39 min    Activity Tolerance Patient tolerated treatment well    Behavior During Therapy WFL for tasks assessed/performed             Past Medical History:  Diagnosis Date   Anxiety    DULoxetine (CYMBALTA)   Asthma    Chest discomfort    in the past   Chest pain    in the past   History of hiatal hernia    Hypercholesterolemia    Hypertriglyceridemia    Osteoarthritis of left hip    PONV (postoperative nausea and vomiting)    Pre-diabetes    year ago was on metformin, then she has been of metforming for many years   Past Surgical History:  Procedure Laterality Date   CARDIAC CATHETERIZATION     2008   CESAREAN SECTION  1986 and 1989   COLONOSCOPY     feet surgery  1990   four   herniated disc  1990 and 1992   two   LUMBAR LAMINECTOMY     rods screws, cadiver bone    TONSILLECTOMY     TOOTH EXTRACTION     TOTAL HIP ARTHROPLASTY Left 02/11/2018   TOTAL HIP ARTHROPLASTY Left 02/11/2018   Procedure: LEFT TOTAL HIP ARTHROPLASTY ANTERIOR APPROACH;  Surgeon: Kathryne Hitch, MD;  Location: [facility];  Service: Orthopedics;  Laterality: Left;   WISDOM TOOTH EXTRACTION     Patient Active Problem List   Diagnosis Date Noted   DOE (dyspnea on exertion) 09/08/2021   Cough variant asthma 09/08/2021   Status post total replacement of left hip 02/11/2018   Unilateral primary osteoarthritis, left hip 01/14/2018   Spondylolisthesis at L4-L5 level 09/09/2017    PCP: Richmond Campbell., PA-C   REFERRING PROVIDER: Julieanne Cotton, PA-C   REFERRING DIAG: [phone] (ICD-10-CM)  - Complete tear of left rotator cuff, unspecified whether traumatic  ONSET DATE: Surgery 03/19/2022  THERAPY DIAG:  Chronic left shoulder pain  Muscle weakness (generalized)  Localized edema  Rationale for Evaluation and Treatment Rehabilitation  PERTINENT HISTORY: Hypercholesterolemia, Pre-diabetes, hypertricglyceridemia, OA Lt hip.  History of Rt shoulder rotator cuff repair (a couple years per Pt. ).   PRECAUTIONS: Shoulder - PROM/AAROM until 05/03/2022, then AROM and strengthening (per referral)  SUBJECTIVE: Pt. Indicated feeling complaints to 1-2/10 ache at worst in last 24 hours.  Pt indicated AAROM c wand in supine is tough.   PAIN:  NPRS scale: current : 0/10 pain 2/10 soreness reported Pain location: Lt shoulder Pain description: sharp, achy Aggravating factors: using arm at all, sleeping Relieving factors: medicine, ice   OBJECTIVE:    PATIENT SURVEYS:  04/20/2022: FOTO intake:  29  predicted:  91   COGNITION: 04/20/2022: Overall cognitive status: Within functional limits for tasks assessed                                     SENSATION: 04/20/2022 [facility]  POSTURE: 04/20/2022 : Arm sling.    UPPER EXTREMITY ROM:    ROM Right 04/20/2022 Left 04/20/2022 PROM in SUPINE Pain limited all directions Left 04/27/2022 PROM in supine  Shoulder flexion   120 140  Shoulder extension       Shoulder abduction   85 130  Shoulder adduction       Shoulder internal rotation   40 in 30 deg abduction   Shoulder external rotation   45 in 30 deg abduction   Elbow flexion       Elbow extension       Wrist flexion       Wrist extension       Wrist ulnar deviation       Wrist radial deviation       Wrist pronation       Wrist supination       (Blank rows = not tested)   UPPER EXTREMITY MMT:   MMT Right 04/20/2022 Left 04/20/2022 Not tested today due to surgical protocol  Shoulder flexion 45    Shoulder extension      Shoulder abduction 3+/5    Shoulder adduction       Shoulder internal rotation 5/5    Shoulder external rotation 4/5    Middle trapezius      Lower trapezius      Elbow flexion 5/5    Elbow extension 5/5    Wrist flexion      Wrist extension      Wrist ulnar deviation      Wrist radial deviation      Wrist pronation      Wrist supination      Grip strength (lbs)      (Blank rows = not tested)   JOINT MOBILITY TESTING:  04/20/2022: not specific limitation in mid range Lt GH joint   PALPATION:  04/20/2022 : General tenderness Lt shoulder region               TODAY'S TREATMENT:  04/27/2022             Therex:                         UBE AAROM fwd/back 3 mins each way   Pulleys flexion, scaption 2 mins each way   Supine AAROM c wand c Rt hand close to Lt hand (5 inches) 2 x 10   Supine wand ER in 60 deg abduction Lt arm 5 sec hold x 10    Supine protraction c wand AAROM 3 sec hold x 10   Supine scapular retraction 5 sec hold x 10                Manual:                         G2 Lt shoulder inferior jt mobs for pain relief, PROM all directions.   04/24/2022             Therex:                         Orson Slick fwd/back 3 mins each way   Pulleys flexion, scaption 2 mins each way   Supine AAROM c wand c Rt hand close to Lt hand (4 inches) 2 x 10   PROM Lt elbow x 10 (cues for home use)   Standing AAROM abduction Lt shoulder  to tolerance x 10 (not for home yet due to difficulty)   Seated scapular retraction 5 sec hold x 10                Manual:                         G2 Lt shoulder inferior jt mobs for pain relief, PROM all directions.    Vasopneumatic   Lt shoulder in sitting 34 degrees low compression  04/20/2022             Therex:                         HEP instruction/performance c cues for techniques, handout provided.  Trial set performed of each for comprehension and symptom assessment.  See below for exercise list.               Manual:                         G2 inferior jt mobs for pain relief, PROM      PATIENT EDUCATION: 04/24/2022 Education details: HEP progression Person educated: Patient Education method: Programmer, multimediaxplanation, Facilities managerDemonstration, Verbal cues, and Handouts Education comprehension: verbalized understanding, returned demonstration, and verbal cues required     HOME EXERCISE PROGRAM: Access Code: [email or web address] URL: [email or web address] Date: 04/24/2022 Prepared by: Chyrel MassonMichael Deryk Bozman  Exercises - Supine Shoulder Flexion AAROM with Hands Clasped  - 2-3 x daily - 7 x weekly - 1-2 sets - 10 reps - 5 hold - Supine Shoulder External Rotation with Dowel  - 2-3 x daily - 7 x weekly - 1-2 sets - 10 reps - 5 hold - Seated Scapular Retraction  - 2-3 x daily - 7 x weekly - 1-2 sets - 10 reps - 5 hold - Standing Circular Shoulder Pendulum Supported with Arm Bent (Mirrored)  - 2-3 x daily - 7 x weekly - 1-2 sets - 10 reps - Standing Flexion Extension Shoulder Pendulum Supported with Arm Bent (Mirrored)  - 2-3 x daily - 7 x weekly - 1-2 sets - 10 reps - Supine Shoulder Flexion Extension AAROM with Dowel  - 2-3 x daily - 7 x weekly - 1-2 sets - 10 reps - 5 hold   ASSESSMENT:   CLINICAL IMPRESSION: Improved measured PROM noted today for Lt shoulder.  AAROM in gravity reduced positioning still provided challenge due to muscle weakness in movement.  Continued skilled PT services indicated at this time.      OBJECTIVE IMPAIRMENTS decreased activity tolerance, decreased coordination, decreased endurance, decreased mobility, decreased ROM, decreased strength, hypomobility, increased edema, impaired perceived functional ability, increased muscle spasms, impaired flexibility, impaired UE functional use, improper body mechanics, postural dysfunction, and pain.    ACTIVITY LIMITATIONS meal prep, cleaning, laundry, interpersonal relationship, driving, shopping, community activity, occupation, and yard work.    PERSONAL FACTORS Hypercholesterolemia, Pre-diabetes, hypertricglyceridemia, OA Lt hip are  also affecting patient's functional outcome.      REHAB POTENTIAL: Good   CLINICAL DECISION MAKING: Stable/uncomplicated   EVALUATION COMPLEXITY: Low     GOALS: Goals reviewed with patient? Yes   Eval Date;  04/20/2022   Short term PT Goals (target date for Short term goals are 3 weeks 05/11/2022) Patient will demonstrate independent use of home exercise program to maintain progress from in clinic treatments. Goal status: on going - assessed 04/24/2022   Long term PT goals (  target dates for all long term goals are 10 weeks  06/29/2022 )   1. Patient will demonstrate/report pain at worst less than or equal to 2/10 to facilitate minimal limitation in daily activity secondary to pain symptoms. Goal status: New   2. Patient will demonstrate independent use of home exercise program to facilitate ability to maintain/progress functional gains from skilled physical therapy services. Goal status: New   3. Patient will demonstrate FOTO outcome > or = 61 % to indicate reduced disability due to condition. Goal status: New   4.  Patient will demonstrate Lt  [facility] joint mobility WFL to facilitate usual self care, dressing, reaching overhead at PLOF s limitation due to symptoms.  Goal status: New   5.  Patient will demonstrate Lt UE MMT 4/5 or greater throughout to facilitate usual lifting, carrying in functional activity to PLOF s limitation.   Goal status: New   6.  Patient will demonstrate/report ability to return to work at [employer].   Goal status: New       PLAN: PT FREQUENCY: 1-2x/week   PT DURATION: 10 weeks   PLANNED INTERVENTIONS: Therapeutic exercises, Therapeutic activity, Neuro Muscular re-education, Balance training, Gait training, Patient/Family education, Joint mobilization, Stair training, DME instructions, Dry Needling, Electrical stimulation, Cryotherapy, Moist heat, Taping, Ultrasound, Ionotophoresis /ml Dexamethasone, and Manual therapy.  All included unless  contraindicated   PLAN FOR NEXT SESSION: PROM/AAROM only until 05/03/2022.  Vaso as desired.   Chyrel Masson, PT, DPT, OCS, ATC 04/27/22  1:31 PM

## 2022-05-09 ENCOUNTER — Ambulatory Visit (INDEPENDENT_AMBULATORY_CARE_PROVIDER_SITE_OTHER): Payer: PRIVATE HEALTH INSURANCE | Admitting: Rehabilitative and Restorative Service Providers"

## 2022-05-09 ENCOUNTER — Encounter: Payer: Self-pay | Admitting: Rehabilitative and Restorative Service Providers"

## 2022-05-09 DIAGNOSIS — R600 Localized edema: Secondary | ICD-10-CM | POA: Diagnosis not present

## 2022-05-09 DIAGNOSIS — M6281 Muscle weakness (generalized): Secondary | ICD-10-CM

## 2022-05-09 DIAGNOSIS — G8929 Other chronic pain: Secondary | ICD-10-CM | POA: Diagnosis not present

## 2022-05-09 DIAGNOSIS — M25512 Pain in left shoulder: Secondary | ICD-10-CM

## 2022-05-09 NOTE — Therapy (Signed)
OUTPATIENT PHYSICAL THERAPY TREATMENT NOTE   Patient Name: SHARDAY MICHL MRN: [id number] DOB:1956-11-17, 66 y.o., female Today's Date: 05/09/2022   END OF SESSION:   PT End of Session - 05/09/22 1115     Visit Number 4    Number of Visits 20    Date for PT Re-Evaluation 06/29/22    Authorization Type [employer]    PT Start Time 1059    PT Stop Time 1139    PT Time Calculation (min) 40 min    Activity Tolerance Patient tolerated treatment well    Behavior During Therapy WFL for tasks assessed/performed              Past Medical History:  Diagnosis Date   Anxiety    DULoxetine (CYMBALTA)   Asthma    Chest discomfort    in the past   Chest pain    in the past   History of hiatal hernia    Hypercholesterolemia    Hypertriglyceridemia    Osteoarthritis of left hip    PONV (postoperative nausea and vomiting)    Pre-diabetes    year ago was on metformin, then she has been of metforming for many years   Past Surgical History:  Procedure Laterality Date   CARDIAC CATHETERIZATION     2008   [place] and 1989   COLONOSCOPY     feet surgery  1990   four   herniated disc  1990 and 1992   two   LUMBAR LAMINECTOMY     rods screws, cadiver bone    TONSILLECTOMY     TOOTH EXTRACTION     TOTAL HIP ARTHROPLASTY Left 02/11/2018   TOTAL HIP ARTHROPLASTY Left 02/11/2018   Procedure: LEFT TOTAL HIP ARTHROPLASTY ANTERIOR APPROACH;  Surgeon: Mcarthur Rossetti, MD;  Location: [facility];  Service: Orthopedics;  Laterality: Left;   WISDOM TOOTH EXTRACTION     Patient Active Problem List   Diagnosis Date Noted   DOE (dyspnea on exertion) 09/08/2021   Cough variant asthma 09/08/2021   Status post total replacement of left hip 02/11/2018   Unilateral primary osteoarthritis, left hip 01/14/2018   Spondylolisthesis at L4-L5 level 09/09/2017    PCP: Aletha Halim., PA-C   REFERRING PROVIDER: Donella Stade, PA-C   REFERRING DIAG: [phone] (ICD-10-CM)  - Complete tear of left rotator cuff, unspecified whether traumatic  ONSET DATE: Surgery 03/19/2022  THERAPY DIAG:  Chronic left shoulder pain  Muscle weakness (generalized)  Localized edema  Rationale for Evaluation and Treatment Rehabilitation  PERTINENT HISTORY: Hypercholesterolemia, Pre-diabetes, hypertricglyceridemia, OA Lt hip.  History of Rt shoulder rotator cuff repair (a couple years per Pt. ).   PRECAUTIONS: Shoulder - PROM/AAROM until 05/03/2022, then AROM and strengthening (per referral)  SUBJECTIVE:   Pt indicated no pain at rest and doing fairly well.  Asked about getting out of sling.   PAIN:  NPRS scale: current : 0/10 pain 2/10 soreness reported Pain location: Lt shoulder Pain description: sharp, achy Aggravating factors: using arm at all, sleeping Relieving factors: medicine, ice   OBJECTIVE:    PATIENT SURVEYS:  04/20/2022: FOTO intake:  29  predicted:  91   COGNITION: 04/20/2022: Overall cognitive status: Within functional limits for tasks assessed                                     SENSATION: 04/20/2022 WFL   POSTURE: 04/20/2022 :  Arm sling.    UPPER EXTREMITY ROM:    ROM Right 04/20/2022 Left 04/20/2022 PROM in SUPINE Pain limited all directions Left 04/27/2022 PROM in supine Left 05/03/2022  AROM  Shoulder flexion   120 140 164  Shoulder extension        Shoulder abduction   85 130 140  Shoulder adduction        Shoulder internal rotation   40 in 30 deg abduction  75 in 45 deg abduction  Shoulder external rotation   45 in 30 deg abduction  60 in 45 deg abduction  Elbow flexion        Elbow extension        Wrist flexion        Wrist extension        Wrist ulnar deviation        Wrist radial deviation        Wrist pronation        Wrist supination        (Blank rows = not tested)   UPPER EXTREMITY MMT:   MMT Right 04/20/2022 Left 04/20/2022 Not tested today due to surgical protocol Left 05/09/2022  Shoulder flexion 4/5     Shoulder  extension       Shoulder abduction 3+/5     Shoulder adduction       Shoulder internal rotation 5/5     Shoulder external rotation 4/5     Middle trapezius       Lower trapezius       Elbow flexion 5/5     Elbow extension 5/5     Wrist flexion       Wrist extension       Wrist ulnar deviation       Wrist radial deviation       Wrist pronation       Wrist supination       Grip strength (lbs)       (Blank rows = not tested)   JOINT MOBILITY TESTING:  04/20/2022: not specific limitation in mid range Lt GH joint   PALPATION:  04/20/2022 : General tenderness Lt shoulder region               TODAY'S TREATMENT:  05/09/2022             Therex:                         UBE AAROM fwd/back 3 mins each way lvl 2.0   Supine AAROM c wand 2 lb x 15 flexion   Supine AROM flexion x 15 (added to HEP)   Sidelying shoulder abduction AROM Lt 2 x 10   Sidelying shoulder ER c towel at side AROM Lt 2 x 10    Standing isometric flexion, er, ir, abduction holds 5 sec on/off x 12 each c arm at side (added to HEP)    Neuro Re-ed   Stabilizations in 100 deg flexion c mild resistance, 15-20 sec bouts          04/27/2022             Therex:                         UBE AAROM fwd/back 3 mins each way   Pulleys flexion, scaption 2 mins each way   Supine AAROM c wand c Rt hand close to Lt hand (5 inches) 2 x  10   Supine wand ER in 60 deg abduction Lt arm 5 sec hold x 10    Supine protraction c wand AAROM 3 sec hold x 10   Supine scapular retraction 5 sec hold x 10                Manual:                         G2 Lt shoulder inferior jt mobs for pain relief, PROM all directions.   04/24/2022             Therex:                         Dwaine Deter fwd/back 3 mins each way   Pulleys flexion, scaption 2 mins each way   Supine AAROM c wand c Rt hand close to Lt hand (4 inches) 2 x 10   PROM Lt elbow x 10 (cues for home use)   Standing AAROM abduction Lt shoulder to tolerance x 10 (not for home yet due to  difficulty)   Seated scapular retraction 5 sec hold x 10                Manual:                         G2 Lt shoulder inferior jt mobs for pain relief, PROM all directions.    Vasopneumatic   Lt shoulder in sitting 34 degrees low compression     PATIENT EDUCATION: 05/09/2022 Education details: HEP progression Person educated: Patient Education method: Consulting civil engineer, Media planner, Verbal cues, and Handouts Education comprehension: verbalized understanding, returned demonstration, and verbal cues required     HOME EXERCISE PROGRAM: Access Code: [email or web address] URL: [email or web address] Date: 05/09/2022 Prepared by: Scot Jun  Exercises - Supine Shoulder External Rotation with Dowel  - 2-3 x daily - 7 x weekly - 1-2 sets - 10 reps - 5 hold - Seated Scapular Retraction  - 2-3 x daily - 7 x weekly - 1-2 sets - 10 reps - 5 hold - Supine Shoulder Flexion Extension AAROM with Dowel  - 2-3 x daily - 7 x weekly - 1-2 sets - 10 reps - 5 hold - Supine Shoulder Flexion Extension Full Range AROM (Mirrored)  - 2-3 x daily - 7 x weekly - 1-2 sets - 10-15 reps - Standing Isometric Shoulder Flexion with Doorway - Arm Bent (Mirrored)  - 1-2 x daily - 7 x weekly - 1 sets - 10 reps - 5-10 hold - Standing Isometric Shoulder Internal Rotation with Towel Roll at Doorway (Mirrored)  - 1-2 x daily - 7 x weekly - 1 sets - 10 reps - 5-10 hold - Standing Isometric Shoulder Abduction with Doorway - Arm Bent  - 1-2 x daily - 7 x weekly - 1 sets - 10 reps - 5-10 hold - Standing Isometric Shoulder External Rotation with Doorway  - 2 x daily - 7 x weekly - 1 sets - 10 reps - 5 hold   ASSESSMENT:   CLINICAL IMPRESSION: Able to progress to AROM at this time.  Due to progress and in review of previous MD office notes, sling use decrease indicated.  Good overall tolerance to introduction of AROM.   Pt to continue to benefit from gravity reduced AROM and muscle activation to promote improved function.       OBJECTIVE IMPAIRMENTS decreased  activity tolerance, decreased coordination, decreased endurance, decreased mobility, decreased ROM, decreased strength, hypomobility, increased edema, impaired perceived functional ability, increased muscle spasms, impaired flexibility, impaired UE functional use, improper body mechanics, postural dysfunction, and pain.    ACTIVITY LIMITATIONS meal prep, cleaning, laundry, interpersonal relationship, driving, shopping, community activity, occupation, and yard work.    PERSONAL FACTORS Hypercholesterolemia, Pre-diabetes, hypertricglyceridemia, OA Lt hip are also affecting patient's functional outcome.      REHAB POTENTIAL: Good   CLINICAL DECISION MAKING: Stable/uncomplicated   EVALUATION COMPLEXITY: Low     GOALS: Goals reviewed with patient? Yes   Eval Date;  04/20/2022   Short term PT Goals (target date for Short term goals are 3 weeks 05/11/2022) Patient will demonstrate independent use of home exercise program to maintain progress from in clinic treatments. Goal status: MET 05/09/2022   Long term PT goals (target dates for all long term goals are 10 weeks  06/29/2022 )   1. Patient will demonstrate/report pain at worst less than or equal to 2/10 to facilitate minimal limitation in daily activity secondary to pain symptoms. Goal status: on going - assessed 05/09/2022   2. Patient will demonstrate independent use of home exercise program to facilitate ability to maintain/progress functional gains from skilled physical therapy services. Goal status: on going - assessed 05/09/2022   3. Patient will demonstrate FOTO outcome > or = 61 % to indicate reduced disability due to condition. Goal status: on going - assessed 05/09/2022   4.  Patient will demonstrate Lt  [facility] joint mobility WFL to facilitate usual self care, dressing, reaching overhead at PLOF s limitation due to symptoms.  Goal status: on going - assessed 05/09/2022   5.  Patient will demonstrate Lt UE  MMT 4/5 or greater throughout to facilitate usual lifting, carrying in functional activity to PLOF s limitation.   Goal status: on going - assessed 05/09/2022   6.  Patient will demonstrate/report ability to return to work at [employer].   Goal status: on going - assessed 05/09/2022       PLAN: PT FREQUENCY: 1-2x/week   PT DURATION: 10 weeks   PLANNED INTERVENTIONS: Therapeutic exercises, Therapeutic activity, Neuro Muscular re-education, Balance training, Gait training, Patient/Family education, Joint mobilization, Stair training, DME instructions, Dry Needling, Electrical stimulation, Cryotherapy, Moist heat, Taping, Ultrasound, Ionotophoresis 33m/ml Dexamethasone, and Manual therapy.  All included unless contraindicated   PLAN FOR NEXT SESSION: AAROM/AROM transitioning as able.  Avoid shrug  MScot Jun PT, DPT, OCS, ATC 05/09/22  11:33 AM

## 2022-05-11 ENCOUNTER — Ambulatory Visit (INDEPENDENT_AMBULATORY_CARE_PROVIDER_SITE_OTHER): Payer: PRIVATE HEALTH INSURANCE | Admitting: Rehabilitative and Restorative Service Providers"

## 2022-05-11 ENCOUNTER — Encounter: Payer: Self-pay | Admitting: Rehabilitative and Restorative Service Providers"

## 2022-05-11 DIAGNOSIS — R600 Localized edema: Secondary | ICD-10-CM | POA: Diagnosis not present

## 2022-05-11 DIAGNOSIS — M6281 Muscle weakness (generalized): Secondary | ICD-10-CM

## 2022-05-11 DIAGNOSIS — M25512 Pain in left shoulder: Secondary | ICD-10-CM

## 2022-05-11 DIAGNOSIS — G8929 Other chronic pain: Secondary | ICD-10-CM | POA: Diagnosis not present

## 2022-05-11 NOTE — Therapy (Addendum)
OUTPATIENT PHYSICAL THERAPY TREATMENT NOTE   Patient Name: KATHLYN LEACHMAN MRN: [id number] DOB:Nov 13, 1956, 66 y.o., female Today's Date: 05/11/2022   END OF SESSION:   PT End of Session - 05/11/22 0928     Visit Number 5    Number of Visits 20    Date for PT Re-Evaluation 06/29/22    Authorization Type [employer]    PT Start Time 0923    PT Stop Time 1002    PT Time Calculation (min) 39 min    Activity Tolerance Patient tolerated treatment well    Behavior During Therapy WFL for tasks assessed/performed               Past Medical History:  Diagnosis Date   Anxiety    DULoxetine (CYMBALTA)   Asthma    Chest discomfort    in the past   Chest pain    in the past   History of hiatal hernia    Hypercholesterolemia    Hypertriglyceridemia    Osteoarthritis of left hip    PONV (postoperative nausea and vomiting)    Pre-diabetes    year ago was on metformin, then she has been of metforming for many years   Past Surgical History:  Procedure Laterality Date   CARDIAC CATHETERIZATION     2008   [place] and 1989   COLONOSCOPY     feet surgery  1990   four   herniated disc  1990 and 1992   two   LUMBAR LAMINECTOMY     rods screws, cadiver bone    TONSILLECTOMY     TOOTH EXTRACTION     TOTAL HIP ARTHROPLASTY Left 02/11/2018   TOTAL HIP ARTHROPLASTY Left 02/11/2018   Procedure: LEFT TOTAL HIP ARTHROPLASTY ANTERIOR APPROACH;  Surgeon: Mcarthur Rossetti, MD;  Location: [place];  Service: Orthopedics;  Laterality: Left;   WISDOM TOOTH EXTRACTION     Patient Active Problem List   Diagnosis Date Noted   DOE (dyspnea on exertion) 09/08/2021   Cough variant asthma 09/08/2021   Status post total replacement of left hip 02/11/2018   Unilateral primary osteoarthritis, left hip 01/14/2018   Spondylolisthesis at L4-L5 level 09/09/2017    PCP: Aletha Halim., PA-C   REFERRING PROVIDER: Donella Stade, PA-C   REFERRING DIAG: [phone]  (ICD-10-CM) - Complete tear of left rotator cuff, unspecified whether traumatic  ONSET DATE: Surgery 03/19/2022  THERAPY DIAG:  Chronic left shoulder pain  Muscle weakness (generalized)  Localized edema  Rationale for Evaluation and Treatment Rehabilitation  PERTINENT HISTORY: Hypercholesterolemia, Pre-diabetes, hypertricglyceridemia, OA Lt hip.  History of Rt shoulder rotator cuff repair (a couple years per Pt. ).   PRECAUTIONS: Shoulder - PROM/AAROM until 05/03/2022, then AROM and strengthening (per referral)  SUBJECTIVE:   No pain but soreness reported yesterday.   PAIN:  NPRS scale: current : 0/10 pain Pain location: Lt shoulder Pain description: sharp, achy Aggravating factors: post exercise soreness Relieving factors: medicine, ice   OBJECTIVE:    PATIENT SURVEYS:  04/20/2022: FOTO intake:  29  predicted:  91   COGNITION: 04/20/2022: Overall cognitive status: Within functional limits for tasks assessed                                     SENSATION: 04/20/2022 WFL   POSTURE: 04/20/2022 : Arm sling.    UPPER EXTREMITY ROM:    ROM Right 04/20/2022  Left 04/20/2022 PROM in SUPINE Pain limited all directions Left 04/27/2022 PROM in supine Left 05/03/2022  AROM  Shoulder flexion   120 140 164  Shoulder extension        Shoulder abduction   85 130 140  Shoulder adduction        Shoulder internal rotation   40 in 30 deg abduction  75 in 45 deg abduction  Shoulder external rotation   45 in 30 deg abduction  60 in 45 deg abduction  Elbow flexion        Elbow extension        Wrist flexion        Wrist extension        Wrist ulnar deviation        Wrist radial deviation        Wrist pronation        Wrist supination        (Blank rows = not tested)   UPPER EXTREMITY MMT:   MMT Right 04/20/2022 Left 04/20/2022 Not tested today due to surgical protocol Left 05/11/2022  Shoulder flexion 4/5   3+/5  Shoulder extension       Shoulder abduction 3+/5     Shoulder  adduction       Shoulder internal rotation 5/5     Shoulder external rotation 4/5     Middle trapezius       Lower trapezius       Elbow flexion 5/5     Elbow extension 5/5     Wrist flexion       Wrist extension       Wrist ulnar deviation       Wrist radial deviation       Wrist pronation       Wrist supination       Grip strength (lbs)       (Blank rows = not tested)   JOINT MOBILITY TESTING:  04/20/2022: not specific limitation in mid range Lt GH joint   PALPATION:  04/20/2022 : General tenderness Lt shoulder region               TODAY'S TREATMENT:  05/11/2022             Therex:                         UBE AAROM fwd/back 4 mins each way lvl 2.0   Pulley flexion, abduction 2 mins each way   Standing UE ranger AAROM flexion c ipsilateral leg step 2 x 15   Standing green band rows 2 x 15, gh ext 2 x 15   Supine AROM flexion x 15 (added to HEP)   Supine 90 deg flexion Lt shoulder circles ccw, ccw 2 x 30 each way 2 lbs   Supine Protraction 3 sec hold Lt arm 2 lb x 15   Sidelying shoulder abduction AROM Lt 2 x 15 1lb   Sidelying shoulder ER c towel at side AROM Lt 2 x 15 1 lb    05/09/2022             Therex:                         UBE AAROM fwd/back 3 mins each way lvl 2.0   Supine AAROM c wand 2 lb x 15 flexion   Supine AROM flexion x 15 (added to HEP)   Sidelying shoulder  abduction AROM Lt 2 x 10   Sidelying shoulder ER c towel at side AROM Lt 2 x 10    Standing isometric flexion, er, ir, abduction holds 5 sec on/off x 12 each c arm at side (added to HEP)    Neuro Re-ed   Stabilizations in 100 deg flexion c mild resistance, 15-20 sec bouts          04/27/2022             Therex:                         UBE AAROM fwd/back 3 mins each way   Pulleys flexion, scaption 2 mins each way   Supine AAROM c wand c Rt hand close to Lt hand (5 inches) 2 x 10   Supine wand ER in 60 deg abduction Lt arm 5 sec hold x 10    Supine protraction c wand AAROM 3 sec hold x 10   Supine  scapular retraction 5 sec hold x 10                Manual:                         G2 Lt shoulder inferior jt mobs for pain relief, PROM all directions.     PATIENT EDUCATION: 05/11/2022 Education details: HEP progression Person educated: Patient Education method: Consulting civil engineer, Media planner, Verbal cues, and Handouts Education comprehension: verbalized understanding, returned demonstration, and verbal cues required     HOME EXERCISE PROGRAM: Access Code: [email or web address] URL: [email or web address] Date: 05/11/2022 Prepared by: Scot Jun  Exercises - Supine Shoulder External Rotation with Dowel  - 2-3 x daily - 7 x weekly - 1-2 sets - 10 reps - 5 hold - Seated Scapular Retraction  - 2-3 x daily - 7 x weekly - 1-2 sets - 10 reps - 5 hold - Standing Isometric Shoulder Flexion with Doorway - Arm Bent (Mirrored)  - 1-2 x daily - 7 x weekly - 1 sets - 10 reps - 5-10 hold - Standing Isometric Shoulder Internal Rotation with Towel Roll at Doorway (Mirrored)  - 1-2 x daily - 7 x weekly - 1 sets - 10 reps - 5-10 hold - Standing Isometric Shoulder Abduction with Doorway - Arm Bent  - 1-2 x daily - 7 x weekly - 1 sets - 10 reps - 5-10 hold - Standing Isometric Shoulder External Rotation with Doorway  - 2 x daily - 7 x weekly - 1 sets - 10 reps - 5 hold - Sidelying Shoulder Abduction Palm Forward (Mirrored)  - 1-2 x daily - 7 x weekly - 2-3 sets - 10-15 reps - Sidelying Shoulder ER with Towel and Dumbbell (Mirrored)  - 1 x daily - 7 x weekly - 3 sets - 10 reps   ASSESSMENT:   CLINICAL IMPRESSION: Continued improvement in activity mobility with full range against gravity without shrug today.  Fatigue in repetitive AROM/strengthening noted and continued skilled PT services to help improve towards goals.      OBJECTIVE IMPAIRMENTS decreased activity tolerance, decreased coordination, decreased endurance, decreased mobility, decreased ROM, decreased strength, hypomobility, increased  edema, impaired perceived functional ability, increased muscle spasms, impaired flexibility, impaired UE functional use, improper body mechanics, postural dysfunction, and pain.    ACTIVITY LIMITATIONS meal prep, cleaning, laundry, interpersonal relationship, driving, shopping, community activity, occupation, and yard work.    PERSONAL FACTORS Hypercholesterolemia, Pre-diabetes, hypertricglyceridemia, OA  Lt hip are also affecting patient's functional outcome.      REHAB POTENTIAL: Good   CLINICAL DECISION MAKING: Stable/uncomplicated   EVALUATION COMPLEXITY: Low     GOALS: Goals reviewed with patient? Yes   Eval Date;  04/20/2022   Short term PT Goals (target date for Short term goals are 3 weeks 05/11/2022) Patient will demonstrate independent use of home exercise program to maintain progress from in clinic treatments. Goal status: MET 05/09/2022   Long term PT goals (target dates for all long term goals are 10 weeks  06/29/2022 )   1. Patient will demonstrate/report pain at worst less than or equal to 2/10 to facilitate minimal limitation in daily activity secondary to pain symptoms. Goal status: on going - assessed 05/09/2022   2. Patient will demonstrate independent use of home exercise program to facilitate ability to maintain/progress functional gains from skilled physical therapy services. Goal status: on going - assessed 05/09/2022   3. Patient will demonstrate FOTO outcome > or = 61 % to indicate reduced disability due to condition. Goal status: on going - assessed 05/09/2022   4.  Patient will demonstrate Lt  [facility] joint mobility WFL to facilitate usual self care, dressing, reaching overhead at PLOF s limitation due to symptoms.  Goal status: on going - assessed 05/09/2022   5.  Patient will demonstrate Lt UE MMT 4/5 or greater throughout to facilitate usual lifting, carrying in functional activity to PLOF s limitation.   Goal status: on going - assessed 05/09/2022   6.  Patient will  demonstrate/report ability to return to work at [employer].   Goal status: on going - assessed 05/09/2022       PLAN: PT FREQUENCY: 1-2x/week   PT DURATION: 10 weeks   PLANNED INTERVENTIONS: Therapeutic exercises, Therapeutic activity, Neuro Muscular re-education, Balance training, Gait training, Patient/Family education, Joint mobilization, Stair training, DME instructions, Dry Needling, Electrical stimulation, Cryotherapy, Moist heat, Taping, Ultrasound, Ionotophoresis 39m/ml Dexamethasone, and Manual therapy.  All included unless contraindicated   PLAN FOR NEXT SESSION: AROM strengthening against gravity as able.   MScot Jun PT, DPT, OCS, ATC 05/11/22  10:00 AM

## 2022-05-14 ENCOUNTER — Encounter: Payer: PRIVATE HEALTH INSURANCE | Admitting: Rehabilitative and Restorative Service Providers"

## 2022-05-16 ENCOUNTER — Encounter: Payer: Self-pay | Admitting: Rehabilitative and Restorative Service Providers"

## 2022-05-16 ENCOUNTER — Ambulatory Visit (INDEPENDENT_AMBULATORY_CARE_PROVIDER_SITE_OTHER): Payer: PRIVATE HEALTH INSURANCE | Admitting: Rehabilitative and Restorative Service Providers"

## 2022-05-16 DIAGNOSIS — R600 Localized edema: Secondary | ICD-10-CM

## 2022-05-16 DIAGNOSIS — M6281 Muscle weakness (generalized): Secondary | ICD-10-CM | POA: Diagnosis not present

## 2022-05-16 DIAGNOSIS — G8929 Other chronic pain: Secondary | ICD-10-CM | POA: Diagnosis not present

## 2022-05-16 DIAGNOSIS — M25512 Pain in left shoulder: Secondary | ICD-10-CM

## 2022-05-16 NOTE — Therapy (Signed)
OUTPATIENT PHYSICAL THERAPY TREATMENT NOTE   Patient Name: BASHA KRYGIER MRN: [id number] DOB:1956/07/04, 66 y.o., female Today's Date: 05/16/2022   END OF SESSION:   PT End of Session - 05/16/22 1015     Visit Number 6    Number of Visits 20    Date for PT Re-Evaluation 06/29/22    Authorization Type [employer]    PT Start Time 1013    PT Stop Time 1052    PT Time Calculation (min) 39 min    Activity Tolerance Patient tolerated treatment well    Behavior During Therapy WFL for tasks assessed/performed                Past Medical History:  Diagnosis Date   Anxiety    DULoxetine (CYMBALTA)   Asthma    Chest discomfort    in the past   Chest pain    in the past   History of hiatal hernia    Hypercholesterolemia    Hypertriglyceridemia    Osteoarthritis of left hip    PONV (postoperative nausea and vomiting)    Pre-diabetes    year ago was on metformin, then she has been of metforming for many years   Past Surgical History:  Procedure Laterality Date   CARDIAC CATHETERIZATION     2008   [place] and 1989   COLONOSCOPY     feet surgery  1990   four   herniated disc  1990 and 1992   two   LUMBAR LAMINECTOMY     rods screws, cadiver bone    TONSILLECTOMY     TOOTH EXTRACTION     TOTAL HIP ARTHROPLASTY Left 02/11/2018   TOTAL HIP ARTHROPLASTY Left 02/11/2018   Procedure: LEFT TOTAL HIP ARTHROPLASTY ANTERIOR APPROACH;  Surgeon: Mcarthur Rossetti, MD;  Location: [place];  Service: Orthopedics;  Laterality: Left;   WISDOM TOOTH EXTRACTION     Patient Active Problem List   Diagnosis Date Noted   DOE (dyspnea on exertion) 09/08/2021   Cough variant asthma 09/08/2021   Status post total replacement of left hip 02/11/2018   Unilateral primary osteoarthritis, left hip 01/14/2018   Spondylolisthesis at L4-L5 level 09/09/2017    PCP: Aletha Halim., PA-C   REFERRING PROVIDER: Donella Stade, PA-C   REFERRING DIAG: [id number]  (ICD-10-CM) - Complete tear of left rotator cuff, unspecified whether traumatic  ONSET DATE: Surgery 03/19/2022  THERAPY DIAG:  Chronic left shoulder pain  Muscle weakness (generalized)  Localized edema  Rationale for Evaluation and Treatment Rehabilitation  PERTINENT HISTORY: Hypercholesterolemia, Pre-diabetes, hypertricglyceridemia, OA Lt hip.  History of Rt shoulder rotator cuff repair (a couple years per Pt. ).   PRECAUTIONS: Shoulder - PROM/AAROM until 05/03/2022, then AROM and strengthening (per referral)  SUBJECTIVE:   Indicated no pain complaints upon arrival and doing well with new HEP.    PAIN:  NPRS scale: current : 0/10 pain Pain location: Lt shoulder Pain description: sharp, achy Aggravating factors: post exercise soreness Relieving factors: medicine, ice   OBJECTIVE:    PATIENT SURVEYS:  05/16/2022 : updated 54%  04/20/2022: FOTO intake:  29  predicted:  91   COGNITION: 04/20/2022: Overall cognitive status: Within functional limits for tasks assessed                                     SENSATION: 04/20/2022 WFL   POSTURE: 04/20/2022 : Arm  sling.    UPPER EXTREMITY ROM:    ROM Right 04/20/2022 Left 04/20/2022 PROM in SUPINE Pain limited all directions Left 04/27/2022 PROM in supine Left 05/03/2022  AROM  Shoulder flexion   120 140 164  Shoulder extension        Shoulder abduction   85 130 140  Shoulder adduction        Shoulder internal rotation   40 in 30 deg abduction  75 in 45 deg abduction  Shoulder external rotation   45 in 30 deg abduction  60 in 45 deg abduction  Elbow flexion        Elbow extension        Wrist flexion        Wrist extension        Wrist ulnar deviation        Wrist radial deviation        Wrist pronation        Wrist supination        (Blank rows = not tested)   UPPER EXTREMITY MMT:   MMT Right 04/20/2022 Left 04/20/2022 Not tested today due to surgical protocol Left 05/11/2022  Shoulder flexion 4/5   3+/5  Shoulder  extension       Shoulder abduction 3+/5     Shoulder adduction       Shoulder internal rotation 5/5     Shoulder external rotation 4/5     Middle trapezius       Lower trapezius       Elbow flexion 5/5     Elbow extension 5/5     Wrist flexion       Wrist extension       Wrist ulnar deviation       Wrist radial deviation       Wrist pronation       Wrist supination       Grip strength (lbs)       (Blank rows = not tested)   JOINT MOBILITY TESTING:  04/20/2022: not specific limitation in mid range Lt GH joint   PALPATION:  04/20/2022 : General tenderness Lt shoulder region               TODAY'S TREATMENT:  05/16/2022             Therex:                         UBE AAROM fwd/back 4 mins each way lvl 3.0   Seated AAROM 1 lb bar flexion x 15   Standing green band rows 2 x 15, gh ext 2 x 15   Standing shoulder flexion full range bilateral x 15   Standing wall push up 2 x 10    Supine horizontal abduction green band 2 x 10    Supine AROM flexion x 20 (to fatigue ) 1 lb   Supine 90 deg flexion Lt shoulder circles ccw, ccw 2 x 20 each way 3 lbs   Sidelying shoulder abduction AROM Lt 1 lb x 20   Sidelying shoulder ER c towel at side AROM Lt 2 x 15 1 lb c fatigue hold at end of set  05/11/2022             Therex:                         UBE AAROM fwd/back 4 mins each way lvl 2.0  Pulley flexion, abduction 2 mins each way   Standing UE ranger AAROM flexion c ipsilateral leg step 2 x 15   Standing green band rows 2 x 15, gh ext 2 x 15   Supine AROM flexion x 15 (added to HEP)   Supine 90 deg flexion Lt shoulder circles ccw, ccw 2 x 30 each way 2 lbs   Supine Protraction 3 sec hold Lt arm 2 lb x 15   Sidelying shoulder abduction AROM Lt 2 x 15 1lb   Sidelying shoulder ER c towel at side AROM Lt 2 x 15 1 lb    05/09/2022             Therex:                         UBE AAROM fwd/back 3 mins each way lvl 2.0   Supine AAROM c wand 2 lb x 15 flexion   Supine AROM flexion x 15 (added to  HEP)   Sidelying shoulder abduction AROM Lt 2 x 10   Sidelying shoulder ER c towel at side AROM Lt 2 x 10    Standing isometric flexion, er, ir, abduction holds 5 sec on/off x 12 each c arm at side (added to HEP)    Neuro Re-ed   Stabilizations in 100 deg flexion c mild resistance, 15-20 sec bouts            PATIENT EDUCATION: 05/11/2022 Education details: HEP progression Person educated: Patient Education method: Consulting civil engineer, Media planner, Verbal cues, and Handouts Education comprehension: verbalized understanding, returned demonstration, and verbal cues required     HOME EXERCISE PROGRAM: Access Code: [email or web address] URL: [email or web address] Date: 05/11/2022 Prepared by: Scot Jun  Exercises - Supine Shoulder External Rotation with Dowel  - 2-3 x daily - 7 x weekly - 1-2 sets - 10 reps - 5 hold - Seated Scapular Retraction  - 2-3 x daily - 7 x weekly - 1-2 sets - 10 reps - 5 hold - Standing Isometric Shoulder Flexion with Doorway - Arm Bent (Mirrored)  - 1-2 x daily - 7 x weekly - 1 sets - 10 reps - 5-10 hold - Standing Isometric Shoulder Internal Rotation with Towel Roll at Doorway (Mirrored)  - 1-2 x daily - 7 x weekly - 1 sets - 10 reps - 5-10 hold - Standing Isometric Shoulder Abduction with Doorway - Arm Bent  - 1-2 x daily - 7 x weekly - 1 sets - 10 reps - 5-10 hold - Standing Isometric Shoulder External Rotation with Doorway  - 2 x daily - 7 x weekly - 1 sets - 10 reps - 5 hold - Sidelying Shoulder Abduction Palm Forward (Mirrored)  - 1-2 x daily - 7 x weekly - 2-3 sets - 10-15 reps - Sidelying Shoulder ER with Towel and Dumbbell (Mirrored)  - 1 x daily - 7 x weekly - 3 sets - 10 reps   ASSESSMENT:   CLINICAL IMPRESSION: FOTO reassessment improved as documented.  Continued focus on progression of AROM c early resistance when appropriate to improve functional strength and mobility.  Fair to good tolerance c general fatigue noted due to muscle weakness.        OBJECTIVE IMPAIRMENTS decreased activity tolerance, decreased coordination, decreased endurance, decreased mobility, decreased ROM, decreased strength, hypomobility, increased edema, impaired perceived functional ability, increased muscle spasms, impaired flexibility, impaired UE functional use, improper body mechanics, postural dysfunction, and pain.    ACTIVITY LIMITATIONS meal prep,  cleaning, laundry, interpersonal relationship, driving, shopping, community activity, occupation, and yard work.    PERSONAL FACTORS Hypercholesterolemia, Pre-diabetes, hypertricglyceridemia, OA Lt hip are also affecting patient's functional outcome.      REHAB POTENTIAL: Good   CLINICAL DECISION MAKING: Stable/uncomplicated   EVALUATION COMPLEXITY: Low     GOALS: Goals reviewed with patient? Yes   Eval Date;  04/20/2022   Short term PT Goals (target date for Short term goals are 3 weeks 05/11/2022) Patient will demonstrate independent use of home exercise program to maintain progress from in clinic treatments. Goal status: MET 05/09/2022   Long term PT goals (target dates for all long term goals are 10 weeks  06/29/2022 )   1. Patient will demonstrate/report pain at worst less than or equal to 2/10 to facilitate minimal limitation in daily activity secondary to pain symptoms. Goal status: on going - assessed 05/09/2022   2. Patient will demonstrate independent use of home exercise program to facilitate ability to maintain/progress functional gains from skilled physical therapy services. Goal status: on going - assessed 05/09/2022   3. Patient will demonstrate FOTO outcome > or = 61 % to indicate reduced disability due to condition. Goal status: on going - assessed 05/09/2022   4.  Patient will demonstrate Lt  [facility] joint mobility WFL to facilitate usual self care, dressing, reaching overhead at PLOF s limitation due to symptoms.  Goal status: on going - assessed 05/09/2022   5.  Patient will demonstrate Lt UE  MMT 4/5 or greater throughout to facilitate usual lifting, carrying in functional activity to PLOF s limitation.   Goal status: on going - assessed 05/09/2022   6.  Patient will demonstrate/report ability to return to work at [employer].   Goal status: on going - assessed 05/09/2022       PLAN: PT FREQUENCY: 1-2x/week   PT DURATION: 10 weeks   PLANNED INTERVENTIONS: Therapeutic exercises, Therapeutic activity, Neuro Muscular re-education, Balance training, Gait training, Patient/Family education, Joint mobilization, Stair training, DME instructions, Dry Needling, Electrical stimulation, Cryotherapy, Moist heat, Taping, Ultrasound, Ionotophoresis 45m/ml Dexamethasone, and Manual therapy.  All included unless contraindicated   PLAN FOR NEXT SESSION: AROM strengthening against gravity (avoid shrug).  MD NOTE  MScot Jun PT, DPT, OCS, ATC 05/16/22  10:52 AM

## 2022-05-21 ENCOUNTER — Encounter: Payer: Self-pay | Admitting: Rehabilitative and Restorative Service Providers"

## 2022-05-21 ENCOUNTER — Ambulatory Visit (INDEPENDENT_AMBULATORY_CARE_PROVIDER_SITE_OTHER): Payer: PRIVATE HEALTH INSURANCE | Admitting: Rehabilitative and Restorative Service Providers"

## 2022-05-21 ENCOUNTER — Telehealth: Payer: Self-pay | Admitting: Orthopedic Surgery

## 2022-05-21 ENCOUNTER — Encounter: Payer: Self-pay | Admitting: Surgical

## 2022-05-21 ENCOUNTER — Ambulatory Visit (INDEPENDENT_AMBULATORY_CARE_PROVIDER_SITE_OTHER): Payer: PRIVATE HEALTH INSURANCE | Admitting: Surgical

## 2022-05-21 DIAGNOSIS — R600 Localized edema: Secondary | ICD-10-CM

## 2022-05-21 DIAGNOSIS — M25512 Pain in left shoulder: Secondary | ICD-10-CM

## 2022-05-21 DIAGNOSIS — G8929 Other chronic pain: Secondary | ICD-10-CM | POA: Diagnosis not present

## 2022-05-21 DIAGNOSIS — M75122 Complete rotator cuff tear or rupture of left shoulder, not specified as traumatic: Secondary | ICD-10-CM

## 2022-05-21 DIAGNOSIS — M6281 Muscle weakness (generalized): Secondary | ICD-10-CM | POA: Diagnosis not present

## 2022-05-21 NOTE — Therapy (Signed)
OUTPATIENT PHYSICAL THERAPY TREATMENT NOTE / PROGRESS NOTE   Patient Name: Ellen Sawyer MRN: [id number] DOB:Mar 07, 1956, 66 y.o., female Today's Date: 05/21/2022  Progress Note Reporting Period 04/20/2022 to 05/21/2022  See note below for Objective Data and Assessment of Progress/Goals.   END OF SESSION:   PT End of Session - 05/21/22 1351     Visit Number 7    Number of Visits 20    Date for PT Re-Evaluation 06/29/22    Authorization Type [employer]    PT Start Time 1345    PT Stop Time 1424    PT Time Calculation (min) 39 min    Activity Tolerance Patient tolerated treatment well    Behavior During Therapy WFL for tasks assessed/performed                 Past Medical History:  Diagnosis Date   Anxiety    DULoxetine (CYMBALTA)   Asthma    Chest discomfort    in the past   Chest pain    in the past   History of hiatal hernia    Hypercholesterolemia    Hypertriglyceridemia    Osteoarthritis of left hip    PONV (postoperative nausea and vomiting)    Pre-diabetes    year ago was on metformin, then she has been of metforming for many years   Past Surgical History:  Procedure Laterality Date   CARDIAC CATHETERIZATION     2008   [place] and 1989   COLONOSCOPY     feet surgery  1990   four   herniated disc  1990 and 1992   two   LUMBAR LAMINECTOMY     rods screws, cadiver bone    TONSILLECTOMY     TOOTH EXTRACTION     TOTAL HIP ARTHROPLASTY Left 02/11/2018   TOTAL HIP ARTHROPLASTY Left 02/11/2018   Procedure: LEFT TOTAL HIP ARTHROPLASTY ANTERIOR APPROACH;  Surgeon: Mcarthur Rossetti, MD;  Location: [facility];  Service: Orthopedics;  Laterality: Left;   WISDOM TOOTH EXTRACTION     Patient Active Problem List   Diagnosis Date Noted   DOE (dyspnea on exertion) 09/08/2021   Cough variant asthma 09/08/2021   Status post total replacement of left hip 02/11/2018   Unilateral primary osteoarthritis, left hip 01/14/2018   Spondylolisthesis  at L4-L5 level 09/09/2017    PCP: Aletha Halim., PA-C   REFERRING PROVIDER: Donella Stade, PA-C   REFERRING DIAG: [phone] (ICD-10-CM) - Complete tear of left rotator cuff, unspecified whether traumatic  ONSET DATE: Surgery 03/19/2022  THERAPY DIAG:  Chronic left shoulder pain  Muscle weakness (generalized)  Localized edema  Rationale for Evaluation and Treatment Rehabilitation  PERTINENT HISTORY: Hypercholesterolemia, Pre-diabetes, hypertricglyceridemia, OA Lt hip.  History of Rt shoulder rotator cuff repair (a couple years per Pt. ).   PRECAUTIONS: Shoulder - PROM/AAROM until 05/03/2022, then AROM and strengthening (per referral)  SUBJECTIVE:  Pt indicated complaints around 2/10.  She mentioned lifting a milk jug and was sore after.   Pt indicated overall improvement 95% overall improvement at this time.   PAIN:  NPRS scale: current : 2/10 pain Pain location: Lt shoulder Pain description: sharp, achy Aggravating factors: post exercise soreness Relieving factors: medicine, ice   OBJECTIVE:    PATIENT SURVEYS:  05/16/2022 : updated 54%  04/20/2022: FOTO intake:  29  predicted:  91   COGNITION: 04/20/2022: Overall cognitive status: Within functional limits for tasks assessed  SENSATION: 04/20/2022 WFL   POSTURE: 05/21/2022:  no sling at all per Pt report.   04/20/2022 : Arm sling.    UPPER EXTREMITY ROM:    ROM Left 04/20/2022 PROM in SUPINE Pain limited all directions Left 04/27/2022 PROM in supine Left 05/03/2022  AROM Left 05/21/2022  AROM in supine  Shoulder flexion 120 140 164 162  Shoulder extension       Shoulder abduction 85 130 140 153  Shoulder adduction       Shoulder internal rotation 40 in 30 deg abduction  75 in 45 deg abduction 75 in 45 deg abduction  Shoulder external rotation 45 in 30 deg abduction  60 in 45 deg abduction 64 in 45 deg abduction  Elbow flexion       Elbow extension       Wrist  flexion       Wrist extension       Wrist ulnar deviation       Wrist radial deviation       Wrist pronation       Wrist supination       (Blank rows = not tested)   UPPER EXTREMITY MMT:   MMT Right 04/20/2022 Left 04/20/2022 Not tested today due to surgical protocol Left 05/11/2022 Left 05/21/2022  Shoulder flexion 4/5   3+/5 4/5  Shoulder extension        Shoulder abduction 3+/5    3+/5  Shoulder adduction        Shoulder internal rotation 5/5    5/5  Shoulder external rotation 4/5    4/5  Middle trapezius        Lower trapezius        Elbow flexion 5/5      Elbow extension 5/5      Wrist flexion        Wrist extension        Wrist ulnar deviation        Wrist radial deviation        Wrist pronation        Wrist supination        Grip strength (lbs)        (Blank rows = not tested)   JOINT MOBILITY TESTING:  04/20/2022: not specific limitation in mid range Lt GH joint   PALPATION:  04/20/2022 : General tenderness Lt shoulder region               TODAY'S TREATMENT:  05/21/2022             Therex:                         UBE AAROM fwd/back 4 mins each way lvl 3.0   Supine AAROM 2 lb bar flexion x 15   Supine 90 deg flexion circles cw, ccw 2 x 30 each way 2 lbs     Sidelying shoulder abduction AROM Lt 1 lb x 20   Sidelying shoulder ER c towel at side AROM Lt 3 x10 1 lb c fatigue hold at end of set    Standing blue band rows x 20 each   Standing wall slide flexion c lift off x 10    Standing ball circles in 90 deg x 20 cw, ccw   Standing flexion 0-90 deg bilateral superset (x10 together, x 10 c contralateral arm in 90 deg flexion hold, x 10 bilateral)      05/16/2022  Therex:                         UBE AAROM fwd/back 4 mins each way lvl 3.0   Seated AAROM 1 lb bar flexion x 15   Standing green band rows 2 x 15, gh ext 2 x 15   Standing shoulder flexion full range bilateral x 15   Standing wall push up 2 x 10    Supine horizontal abduction green band 2  x 10    Supine AROM flexion x 20 (to fatigue ) 1 lb   Supine 90 deg flexion Lt shoulder circles ccw, ccw 2 x 20 each way 3 lbs   Sidelying shoulder abduction AROM Lt 1 lb x 20   Sidelying shoulder ER c towel at side AROM Lt 2 x 15 1 lb c fatigue hold at end of set  05/11/2022             Therex:                         UBE AAROM fwd/back 4 mins each way lvl 2.0   Pulley flexion, abduction 2 mins each way   Standing UE ranger AAROM flexion c ipsilateral leg step 2 x 15   Standing green band rows 2 x 15, gh ext 2 x 15   Supine AROM flexion x 15 (added to HEP)   Supine 90 deg flexion Lt shoulder circles ccw, ccw 2 x 30 each way 2 lbs   Supine Protraction 3 sec hold Lt arm 2 lb x 15   Sidelying shoulder abduction AROM Lt 2 x 15 1lb   Sidelying shoulder ER c towel at side AROM Lt 2 x 15 1 lb          PATIENT EDUCATION: 05/11/2022 Education details: HEP progression Person educated: Patient Education method: Consulting civil engineer, Media planner, Verbal cues, and Handouts Education comprehension: verbalized understanding, returned demonstration, and verbal cues required     HOME EXERCISE PROGRAM: Access Code: [email or web address] URL: [email or web address] Date: 05/11/2022 Prepared by: Scot Jun  Exercises - Supine Shoulder External Rotation with Dowel  - 2-3 x daily - 7 x weekly - 1-2 sets - 10 reps - 5 hold - Seated Scapular Retraction  - 2-3 x daily - 7 x weekly - 1-2 sets - 10 reps - 5 hold - Standing Isometric Shoulder Flexion with Doorway - Arm Bent (Mirrored)  - 1-2 x daily - 7 x weekly - 1 sets - 10 reps - 5-10 hold - Standing Isometric Shoulder Internal Rotation with Towel Roll at Doorway (Mirrored)  - 1-2 x daily - 7 x weekly - 1 sets - 10 reps - 5-10 hold - Standing Isometric Shoulder Abduction with Doorway - Arm Bent  - 1-2 x daily - 7 x weekly - 1 sets - 10 reps - 5-10 hold - Standing Isometric Shoulder External Rotation with Doorway  - 2 x daily - 7 x weekly - 1 sets - 10 reps -  5 hold - Sidelying Shoulder Abduction Palm Forward (Mirrored)  - 1-2 x daily - 7 x weekly - 2-3 sets - 10-15 reps - Sidelying Shoulder ER with Towel and Dumbbell (Mirrored)  - 1 x daily - 7 x weekly - 3 sets - 10 reps   ASSESSMENT:   CLINICAL IMPRESSION: Pt has attended 7 visits overall during course of treatment.  See objective data for updated information showing gains in mobility and  strength as documented.  Pt to continue to benefit from skilled PT services to improve functional strength for daily lifting/carrying at PLOF.       OBJECTIVE IMPAIRMENTS decreased activity tolerance, decreased coordination, decreased endurance, decreased mobility, decreased ROM, decreased strength, hypomobility, increased edema, impaired perceived functional ability, increased muscle spasms, impaired flexibility, impaired UE functional use, improper body mechanics, postural dysfunction, and pain.    ACTIVITY LIMITATIONS meal prep, cleaning, laundry, interpersonal relationship, driving, shopping, community activity, occupation, and yard work.    PERSONAL FACTORS Hypercholesterolemia, Pre-diabetes, hypertricglyceridemia, OA Lt hip are also affecting patient's functional outcome.      REHAB POTENTIAL: Good   CLINICAL DECISION MAKING: Stable/uncomplicated   EVALUATION COMPLEXITY: Low     GOALS: Goals reviewed with patient? Yes   Eval Date;  04/20/2022   Short term PT Goals (target date for Short term goals are 3 weeks 05/11/2022) Patient will demonstrate independent use of home exercise program to maintain progress from in clinic treatments. Goal status: MET 05/09/2022   Long term PT goals (target dates for all long term goals are 10 weeks  06/29/2022 )   1. Patient will demonstrate/report pain at worst less than or equal to 2/10 to facilitate minimal limitation in daily activity secondary to pain symptoms. Goal status: on going - assessed 05/21/2022   2. Patient will demonstrate independent use of home  exercise program to facilitate ability to maintain/progress functional gains from skilled physical therapy services. Goal status: on going - assessed 05/21/2022   3. Patient will demonstrate FOTO outcome > or = 61 % to indicate reduced disability due to condition. Goal status: on going - assessed 05/21/2022   4.  Patient will demonstrate Lt  [facility] joint mobility WFL to facilitate usual self care, dressing, reaching overhead at PLOF s limitation due to symptoms.  Goal status: on going - assessed 05/21/2022   5.  Patient will demonstrate Lt UE MMT 4/5 or greater throughout to facilitate usual lifting, carrying in functional activity to PLOF s limitation.   Goal status: on going - assessed 05/21/2022   6.  Patient will demonstrate/report ability to return to work at [employer].   Goal status: on going - assessed 05/21/2022       PLAN: PT FREQUENCY: 1-2x/week   PT DURATION: 10 weeks   PLANNED INTERVENTIONS: Therapeutic exercises, Therapeutic activity, Neuro Muscular re-education, Balance training, Gait training, Patient/Family education, Joint mobilization, Stair training, DME instructions, Dry Needling, Electrical stimulation, Cryotherapy, Moist heat, Taping, Ultrasound, Ionotophoresis 39m/ml Dexamethasone, and Manual therapy.  All included unless contraindicated   PLAN FOR NEXT SESSION: Progressive strengthening improvements to tolerance.   MScot Jun PT, DPT, OCS, ATC 05/21/22  2:21 PM

## 2022-05-21 NOTE — Telephone Encounter (Signed)
[employer] forms received. To [employer]. °

## 2022-05-21 NOTE — Progress Notes (Signed)
Post-Op Visit Note   Patient: Ellen Sawyer           Date of Birth: [birth year]           MRN: [id number] Visit Date: 05/21/2022 PCP: Richmond Campbell., PA-C   Assessment & Plan:  Chief Complaint:  Chief Complaint  Patient presents with   Left Shoulder - Follow-up   Visit Diagnoses:  1. Complete tear of left rotator cuff, unspecified whether traumatic     Plan: Patient is a 66 year old female who presents s/p left shoulder rotator cuff repair and biceps tenodesis on 03/19/2022.  Patient is doing well with occasional soreness.  Back to her baseline level of sleep.  Going to physical therapy 1 time per week.  She feels her range of motion has continued to improve and she has not really plateaued yet with physical therapy.  Does not have to take any medications for pain.  She does note occasional clicking sensation in the shoulder.  No other complaints.  On exam, patient has 30 degrees external rotation, 110 degrees abduction, 160 degrees forward flexion.  Excellent rotator cuff strength of supra, infra, subscap.  Axillary nerve intact with deltoid firing.  No significant coarse grinding or crepitus noted passive motion of the shoulder.  Plan is continue with physical therapy.  She is okay for full active range of motion of the left shoulder and for some light strengthening exercises to progress over the next month.  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  She is okay for doing desk work and doing blood draws but cautioned her against any significant lifting more than about 5 pounds or any pushing/pulling.  Patient agreed with plan.  Follow-up with Dr. August Saucer for clinical recheck in 6 weeks.  Follow-Up Instructions: No follow-ups on file.   Orders:  No orders of the defined types were placed in this encounter.  No orders of the defined types were placed in this encounter.   Imaging: No results found.  PMFS History: Patient Active Problem List    Diagnosis Date Noted   DOE (dyspnea on exertion) 09/08/2021   Cough variant asthma 09/08/2021   Status post total replacement of left hip 02/11/2018   Unilateral primary osteoarthritis, left hip 01/14/2018   Spondylolisthesis at L4-L5 level 09/09/2017   Past Medical History:  Diagnosis Date   Anxiety    DULoxetine (CYMBALTA)   Asthma    Chest discomfort    in the past   Chest pain    in the past   History of hiatal hernia    Hypercholesterolemia    Hypertriglyceridemia    Osteoarthritis of left hip    PONV (postoperative nausea and vomiting)    Pre-diabetes    year ago was on metformin, then she has been of metforming for many years    Family History  Problem Relation Age of Onset   Coronary artery disease Father        27, several bypass   Dementia Father    Pancreatic cancer Mother     Past Surgical History:  Procedure Laterality Date   CARDIAC CATHETERIZATION     2008   CESAREAN SECTION  1986 and 1989   COLONOSCOPY     feet surgery  1990   four   herniated disc  1990 and 1992   two   LUMBAR LAMINECTOMY     rods screws, cadiver bone    TONSILLECTOMY  TOOTH EXTRACTION     TOTAL HIP ARTHROPLASTY Left 02/11/2018   TOTAL HIP ARTHROPLASTY Left 02/11/2018   Procedure: LEFT TOTAL HIP ARTHROPLASTY ANTERIOR APPROACH;  Surgeon: Kathryne Hitch, MD;  Location: [facility];  Service: Orthopedics;  Laterality: Left;   WISDOM TOOTH EXTRACTION     Social History   Occupational History   Not on file  Tobacco Use   Smoking status: Never   Smokeless tobacco: Never  Vaping Use   Vaping Use: Never used  Substance and Sexual Activity   Alcohol use: No   Drug use: No   Sexual activity: Not on file

## 2022-05-23 ENCOUNTER — Encounter: Payer: PRIVATE HEALTH INSURANCE | Admitting: Rehabilitative and Restorative Service Providers"

## 2022-05-28 ENCOUNTER — Encounter: Payer: Self-pay | Admitting: Rehabilitative and Restorative Service Providers"

## 2022-05-28 ENCOUNTER — Ambulatory Visit (INDEPENDENT_AMBULATORY_CARE_PROVIDER_SITE_OTHER): Payer: PRIVATE HEALTH INSURANCE | Admitting: Rehabilitative and Restorative Service Providers"

## 2022-05-28 DIAGNOSIS — R600 Localized edema: Secondary | ICD-10-CM | POA: Diagnosis not present

## 2022-05-28 DIAGNOSIS — G8929 Other chronic pain: Secondary | ICD-10-CM

## 2022-05-28 DIAGNOSIS — M25512 Pain in left shoulder: Secondary | ICD-10-CM | POA: Diagnosis not present

## 2022-05-28 DIAGNOSIS — M6281 Muscle weakness (generalized): Secondary | ICD-10-CM | POA: Diagnosis not present

## 2022-05-30 ENCOUNTER — Encounter: Payer: PRIVATE HEALTH INSURANCE | Admitting: Rehabilitative and Restorative Service Providers"

## 2022-06-04 ENCOUNTER — Ambulatory Visit (INDEPENDENT_AMBULATORY_CARE_PROVIDER_SITE_OTHER): Payer: PRIVATE HEALTH INSURANCE | Admitting: Physical Therapy

## 2022-06-04 ENCOUNTER — Encounter: Payer: Self-pay | Admitting: Physical Therapy

## 2022-06-04 DIAGNOSIS — M6281 Muscle weakness (generalized): Secondary | ICD-10-CM

## 2022-06-04 DIAGNOSIS — M25512 Pain in left shoulder: Secondary | ICD-10-CM

## 2022-06-04 DIAGNOSIS — G8929 Other chronic pain: Secondary | ICD-10-CM | POA: Diagnosis not present

## 2022-06-04 DIAGNOSIS — R600 Localized edema: Secondary | ICD-10-CM | POA: Diagnosis not present

## 2022-06-04 NOTE — Therapy (Signed)
OUTPATIENT PHYSICAL THERAPY TREATMENT NOTE    Patient Name: Ellen Sawyer MRN: [id number] DOB:1956/08/08, 66 y.o., female Today's Date: 06/04/2022    END OF SESSION:   PT End of Session - 06/04/22 1611     Visit Number 9    Number of Visits 20    Date for PT Re-Evaluation 06/29/22    Authorization Type [employer]    Progress Note Due on Visit 17    PT Start Time 1600    PT Stop Time 1639    PT Time Calculation (min) 39 min    Activity Tolerance Patient tolerated treatment well    Behavior During Therapy WFL for tasks assessed/performed                  Past Medical History:  Diagnosis Date   Anxiety    DULoxetine (CYMBALTA)   Asthma    Chest discomfort    in the past   Chest pain    in the past   History of hiatal hernia    Hypercholesterolemia    Hypertriglyceridemia    Osteoarthritis of left hip    PONV (postoperative nausea and vomiting)    Pre-diabetes    year ago was on metformin, then she has been of metforming for many years   Past Surgical History:  Procedure Laterality Date   CARDIAC CATHETERIZATION     2008   [place] and 1989   COLONOSCOPY     feet surgery  1990   four   herniated disc  1990 and 1992   two   LUMBAR LAMINECTOMY     rods screws, cadiver bone    TONSILLECTOMY     TOOTH EXTRACTION     TOTAL HIP ARTHROPLASTY Left 02/11/2018   TOTAL HIP ARTHROPLASTY Left 02/11/2018   Procedure: LEFT TOTAL HIP ARTHROPLASTY ANTERIOR APPROACH;  Surgeon: Mcarthur Rossetti, MD;  Location: [place] [facility];  Service: Orthopedics;  Laterality: Left;   WISDOM TOOTH EXTRACTION     Patient Active Problem List   Diagnosis Date Noted   DOE (dyspnea on exertion) 09/08/2021   Cough variant asthma 09/08/2021   Status post total replacement of left hip 02/11/2018   Unilateral primary osteoarthritis, left hip 01/14/2018   Spondylolisthesis at L4-L5 level 09/09/2017    PCP: Aletha Halim., PA-C   REFERRING PROVIDER: Donella Stade,  PA-C   REFERRING DIAG: [id number] (ICD-10-CM) - Complete tear of left rotator cuff, unspecified whether traumatic  ONSET DATE: Surgery 03/19/2022  THERAPY DIAG:  Chronic left shoulder pain  Muscle weakness (generalized)  Localized edema  Rationale for Evaluation and Treatment Rehabilitation  PERTINENT HISTORY: Hypercholesterolemia, Pre-diabetes, hypertricglyceridemia, OA Lt hip.  History of Rt shoulder rotator cuff repair (a couple years per Pt. ).   PRECAUTIONS: Shoulder - PROM/AAROM until 05/03/2022, then AROM and strengthening (per referral)  SUBJECTIVE:  Pt indicated no pain but still some difficulty reaching out to the side.    PAIN:  NPRS scale:0/10 Pain location: Lt shoulder Pain description: Aggravating factors: post exercise soreness Relieving factors: medicine, ice   OBJECTIVE:    PATIENT SURVEYS:  05/16/2022 : updated 54%  04/20/2022: FOTO intake:  29  predicted:  91   COGNITION: 04/20/2022: Overall cognitive status: Within functional limits for tasks assessed                                     SENSATION: 04/20/2022  WFL   POSTURE: 05/21/2022:  no sling at all per Pt report.   04/20/2022 : Arm sling.    UPPER EXTREMITY ROM:    ROM Left 04/20/2022 PROM in SUPINE Pain limited all directions Left 04/27/2022 PROM in supine Left 05/03/2022  AROM Left 05/21/2022  AROM in supine  Shoulder flexion 120 140 164 162  Shoulder extension       Shoulder abduction 85 130 140 153  Shoulder adduction       Shoulder internal rotation 40 in 30 deg abduction  75 in 45 deg abduction 75 in 45 deg abduction  Shoulder external rotation 45 in 30 deg abduction  60 in 45 deg abduction 64 in 45 deg abduction  Elbow flexion       Elbow extension       Wrist flexion       Wrist extension       Wrist ulnar deviation       Wrist radial deviation       Wrist pronation       Wrist supination       (Blank rows = not tested)   UPPER EXTREMITY MMT:   MMT Right 04/20/2022  Left 04/20/2022 Not tested today due to surgical protocol Left 05/11/2022 Left 05/21/2022 Left 05/28/2022  Shoulder flexion 4/5   3+/5 4/5 4/5  Shoulder extension         Shoulder abduction 3+/5    3+/5 3+/5  Shoulder adduction         Shoulder internal rotation 5/5    5/5   Shoulder external rotation 4/5    4/5   Middle trapezius         Lower trapezius         Elbow flexion 5/5       Elbow extension 5/5       Wrist flexion         Wrist extension         Wrist ulnar deviation         Wrist radial deviation         Wrist pronation         Wrist supination         Grip strength (lbs)         (Blank rows = not tested)   JOINT MOBILITY TESTING:  04/20/2022: not specific limitation in mid range Lt GH joint   PALPATION:  04/20/2022 : General tenderness Lt shoulder region               TODAY'S TREATMENT:  06/04/2022             Therex:                         UBE AAROM fwd/back 4 mins each way lvl 3.0   Supine green band horizontal abduction 3 x 10    Supine AAROM 2 lb bar flexion x 20         Sidelying shoulder abduction AROM Lt 1 lb 3 x 15    Sidelying shoulder ER c towel at side AROM Lt 3 x 15 1 lb c fatigue hold at end of set      Standing blue band rows X 20  Standing blue band GH ext x 20    Standing wall slide in scaption (y shape) c lift off x 10    Standing ball circles in 90 deg 2 x 30 cw, ccw  Standing flexion 0-90 deg bilateral AROM 2X10   Standing abduction AROM bilat 2X10 with shorter lever arm (elbows bent)    Standing Doorway ER stretching in 90/90 15 sec x 5 Lt shoulder   05/28/2022             Therex:                         UBE AAROM fwd/back 4 mins each way lvl 3.0   Supine green band horizontal abduction 3 x 10          Sidelying shoulder abduction AROM Lt 1 lb 3 x 15    Sidelying shoulder ER c towel at side AROM Lt 3 x 15 1 lb c fatigue hold at end of set     Standing blue band GH ext x 20    Standing wall slide in scaption (y shape) c lift off x  10    Standing ball circles in 90 deg 2 x 30 cw, ccw   Standing Doorway ER stretching in 90/90 15 sec x 5 Lt shoulder    Standing flexion 0-90 deg bilateral superset (x10 together, x 10 c contralateral arm in 90 deg flexion hold, x 10 bilateral)  05/21/2022             Therex:                         UBE AAROM fwd/back 4 mins each way lvl 3.0   Supine AAROM 2 lb bar flexion x 15   Supine 90 deg flexion circles cw, ccw 2 x 30 each way 2 lbs     Sidelying shoulder abduction AROM Lt 1 lb x 20   Sidelying shoulder ER c towel at side AROM Lt 3 x10 1 lb c fatigue hold at end of set    Standing blue band rows x 20 each   Standing wall slide flexion c lift off x 10    Standing ball circles in 90 deg x 20 cw, ccw   Standing flexion 0-90 deg bilateral superset (x10 together, x 10 c contralateral arm in 90 deg flexion hold, x 10 bilateral)            PATIENT EDUCATION: 05/11/2022 Education details: HEP progression Person educated: Patient Education method: Consulting civil engineer, Media planner, Verbal cues, and Handouts Education comprehension: verbalized understanding, returned demonstration, and verbal cues required     HOME EXERCISE PROGRAM: Access Code: [email or web address] URL: [email or web address] Date: 05/11/2022 Prepared by: Scot Jun  Exercises - Supine Shoulder External Rotation with Dowel  - 2-3 x daily - 7 x weekly - 1-2 sets - 10 reps - 5 hold - Seated Scapular Retraction  - 2-3 x daily - 7 x weekly - 1-2 sets - 10 reps - 5 hold - Standing Isometric Shoulder Flexion with Doorway - Arm Bent (Mirrored)  - 1-2 x daily - 7 x weekly - 1 sets - 10 reps - 5-10 hold - Standing Isometric Shoulder Internal Rotation with Towel Roll at Doorway (Mirrored)  - 1-2 x daily - 7 x weekly - 1 sets - 10 reps - 5-10 hold - Standing Isometric Shoulder Abduction with Doorway - Arm Bent  - 1-2 x daily - 7 x weekly - 1 sets - 10 reps - 5-10 hold - Standing Isometric Shoulder External Rotation with  Doorway  - 2 x daily - 7 x weekly - 1 sets - 10 reps -  5 hold - Sidelying Shoulder Abduction Palm Forward (Mirrored)  - 1-2 x daily - 7 x weekly - 2-3 sets - 10-15 reps - Sidelying Shoulder ER with Towel and Dumbbell (Mirrored)  - 1 x daily - 7 x weekly - 3 sets - 10 reps   ASSESSMENT:   CLINICAL IMPRESSION: She is doing reasonably well post op but still with some functional weakness needing for reaching and lifting. She had good overall tolerance to strength program today.      OBJECTIVE IMPAIRMENTS decreased activity tolerance, decreased coordination, decreased endurance, decreased mobility, decreased ROM, decreased strength, hypomobility, increased edema, impaired perceived functional ability, increased muscle spasms, impaired flexibility, impaired UE functional use, improper body mechanics, postural dysfunction, and pain.    ACTIVITY LIMITATIONS meal prep, cleaning, laundry, interpersonal relationship, driving, shopping, community activity, occupation, and yard work.    PERSONAL FACTORS Hypercholesterolemia, Pre-diabetes, hypertricglyceridemia, OA Lt hip are also affecting patient's functional outcome.      REHAB POTENTIAL: Good   CLINICAL DECISION MAKING: Stable/uncomplicated   EVALUATION COMPLEXITY: Low     GOALS: Goals reviewed with patient? Yes   Eval Date;  04/20/2022   Short term PT Goals (target date for Short term goals are 3 weeks 05/11/2022) Patient will demonstrate independent use of home exercise program to maintain progress from in clinic treatments. Goal status: MET 05/09/2022   Long term PT goals (target dates for all long term goals are 10 weeks  06/29/2022 )   1. Patient will demonstrate/report pain at worst less than or equal to 2/10 to facilitate minimal limitation in daily activity secondary to pain symptoms. Goal status: on going - assessed 05/21/2022   2. Patient will demonstrate independent use of home exercise program to facilitate ability to  maintain/progress functional gains from skilled physical therapy services. Goal status: on going - assessed 05/21/2022   3. Patient will demonstrate FOTO outcome > or = 61 % to indicate reduced disability due to condition. Goal status: on going - assessed 05/21/2022   4.  Patient will demonstrate Lt  [facility] joint mobility WFL to facilitate usual self care, dressing, reaching overhead at PLOF s limitation due to symptoms.  Goal status: on going - assessed 05/21/2022   5.  Patient will demonstrate Lt UE MMT 4/5 or greater throughout to facilitate usual lifting, carrying in functional activity to PLOF s limitation.   Goal status: on going - assessed 05/21/2022   6.  Patient will demonstrate/report ability to return to work at [employer].   Goal status: on going - assessed 05/21/2022       PLAN: PT FREQUENCY: 1-2x/week   PT DURATION: 10 weeks   PLANNED INTERVENTIONS: Therapeutic exercises, Therapeutic activity, Neuro Muscular re-education, Balance training, Gait training, Patient/Family education, Joint mobilization, Stair training, DME instructions, Dry Needling, Electrical stimulation, Cryotherapy, Moist heat, Taping, Ultrasound, Ionotophoresis 86m/ml Dexamethasone, and Manual therapy.  All included unless contraindicated   PLAN FOR NEXT SESSION: She has one more visit scheduled so assess to see if she if ready for independent program or if she needs to set up more visits. Continued high rep strengthening in multiple directions for Lt shoulder.   BElsie Ra PT, DPT 06/04/22 4:12 PM

## 2022-06-06 ENCOUNTER — Encounter: Payer: PRIVATE HEALTH INSURANCE | Admitting: Rehabilitative and Restorative Service Providers"

## 2022-06-11 ENCOUNTER — Encounter: Payer: Self-pay | Admitting: Rehabilitative and Restorative Service Providers"

## 2022-06-11 ENCOUNTER — Ambulatory Visit (INDEPENDENT_AMBULATORY_CARE_PROVIDER_SITE_OTHER): Payer: PRIVATE HEALTH INSURANCE | Admitting: Rehabilitative and Restorative Service Providers"

## 2022-06-11 DIAGNOSIS — R600 Localized edema: Secondary | ICD-10-CM

## 2022-06-11 DIAGNOSIS — M25512 Pain in left shoulder: Secondary | ICD-10-CM

## 2022-06-11 DIAGNOSIS — G8929 Other chronic pain: Secondary | ICD-10-CM

## 2022-06-11 DIAGNOSIS — M6281 Muscle weakness (generalized): Secondary | ICD-10-CM | POA: Diagnosis not present

## 2022-06-11 NOTE — Therapy (Addendum)
OUTPATIENT PHYSICAL THERAPY TREATMENT NOTE /PROGRESS NOTE /DISCHARGE   Patient Name: Ellen Sawyer MRN: [id number] DOB:Aug 26, 1956, 66 y.o., female Today's Date: 06/11/2022  Progress Note Reporting Period 05/21/2022 to 06/11/2022  See note below for Objective Data and Assessment of Progress/Goals.       END OF SESSION:   PT End of Session - 06/11/22 1551     Visit Number 10    Number of Visits 20    Date for PT Re-Evaluation 06/29/22    Authorization Type [employer]    Progress Note Due on Visit 17    PT Start Time 1546    PT Stop Time 1611    PT Time Calculation (min) 25 min    Activity Tolerance Patient tolerated treatment well    Behavior During Therapy WFL for tasks assessed/performed                   Past Medical History:  Diagnosis Date   Anxiety    DULoxetine (CYMBALTA)   Asthma    Chest discomfort    in the past   Chest pain    in the past   History of hiatal hernia    Hypercholesterolemia    Hypertriglyceridemia    Osteoarthritis of left hip    PONV (postoperative nausea and vomiting)    Pre-diabetes    year ago was on metformin, then she has been of metforming for many years   Past Surgical History:  Procedure Laterality Date   CARDIAC CATHETERIZATION     2008   [place] and 1989   COLONOSCOPY     feet surgery  1990   four   herniated disc  1990 and 1992   two   LUMBAR LAMINECTOMY     rods screws, cadiver bone    TONSILLECTOMY     TOOTH EXTRACTION     TOTAL HIP ARTHROPLASTY Left 02/11/2018   TOTAL HIP ARTHROPLASTY Left 02/11/2018   Procedure: LEFT TOTAL HIP ARTHROPLASTY ANTERIOR APPROACH;  Surgeon: Mcarthur Rossetti, MD;  Location: [place];  Service: Orthopedics;  Laterality: Left;   WISDOM TOOTH EXTRACTION     Patient Active Problem List   Diagnosis Date Noted   DOE (dyspnea on exertion) 09/08/2021   Cough variant asthma 09/08/2021   Status post total replacement of left hip 02/11/2018   Unilateral primary  osteoarthritis, left hip 01/14/2018   Spondylolisthesis at L4-L5 level 09/09/2017    PCP: Aletha Halim., PA-C   REFERRING PROVIDER: Donella Stade, PA-C   REFERRING DIAG: [phone] (ICD-10-CM) - Complete tear of left rotator cuff, unspecified whether traumatic  ONSET DATE: Surgery 03/19/2022  THERAPY DIAG:  Chronic left shoulder pain  Muscle weakness (generalized)  Localized edema  Rationale for Evaluation and Treatment Rehabilitation  PERTINENT HISTORY: Hypercholesterolemia, Pre-diabetes, hypertricglyceridemia, OA Lt hip.  History of Rt shoulder rotator cuff repair (a couple years per Pt. ).   PRECAUTIONS: Shoulder - PROM/AAROM until 05/03/2022, then AROM and strengthening (per referral)  SUBJECTIVE: No pain complaints to report.  Felt > 90% improvement during daily life.   PAIN:  NPRS scale:0/10 Pain location: Lt shoulder Pain description: Aggravating factors: post exercise soreness Relieving factors: medicine, ice   OBJECTIVE:    PATIENT SURVEYS:  06/11/2022:  updated: 59%  05/16/2022 : updated 54%  04/20/2022: FOTO intake:  29  predicted:  91   COGNITION: 04/20/2022: Overall cognitive status: Within functional limits for tasks assessed  SENSATION: 04/20/2022 WFL   POSTURE: 05/21/2022:  no sling at all per Pt report.   04/20/2022 : Arm sling.    UPPER EXTREMITY ROM:    ROM Left 04/20/2022 PROM in SUPINE Pain limited all directions Left 04/27/2022 PROM in supine Left 05/03/2022  AROM Left 05/21/2022  AROM in supine Left 06/11/2022  AROM in supine  Shoulder flexion 120 140 164 162 160  Shoulder extension        Shoulder abduction 85 130 140 153   Shoulder adduction        Shoulder internal rotation 40 in 30 deg abduction  75 in 45 deg abduction 75 in 45 deg abduction   Shoulder external rotation 45 in 30 deg abduction  60 in 45 deg abduction 64 in 45 deg abduction 70 degrees in 45 deg abduction  Elbow flexion         Elbow extension        Wrist flexion        Wrist extension        Wrist ulnar deviation        Wrist radial deviation        Wrist pronation        Wrist supination        (Blank rows = not tested)   UPPER EXTREMITY MMT:   MMT Right 04/20/2022 Left 04/20/2022 Not tested today due to surgical protocol Left 05/11/2022 Left 05/21/2022 Left 05/28/2022 Left 06/11/2022  Shoulder flexion 4/5   3+/5 4/5 4/5 4+/5  Shoulder extension          Shoulder abduction 3+/5    3+/5 3+/5 4/5  Shoulder adduction          Shoulder internal rotation 5/5    5/5  5/5  Shoulder external rotation 4/5    4/5  4+/5  Middle trapezius          Lower trapezius          Elbow flexion 5/5        Elbow extension 5/5        Wrist flexion          Wrist extension          Wrist ulnar deviation          Wrist radial deviation          Wrist pronation          Wrist supination          Grip strength (lbs)          (Blank rows = not tested)   JOINT MOBILITY TESTING:  04/20/2022: not specific limitation in mid range Lt GH joint   PALPATION:  04/20/2022 : General tenderness Lt shoulder region               TODAY'S TREATMENT:  06/11/2022             Therex:                         UBE AAROM fwd/back 4 mins each way lvl 3.5   Supine green band horizontal abduction 3 x 10    Standing doorway stretch ER Lt arm in 80 deg abduction 15 sec x 3       Standing blue band rows X 20  Standing blue band GH ext x 20       Standing flexion 0-90 deg bilateral AROM 2X10 1 lb  Review of HEP c printout  06/04/2022             Therex:                         UBE AAROM fwd/back 4 mins each way lvl 3.0   Supine green band horizontal abduction 3 x 10    Supine AAROM 2 lb bar flexion x 20         Sidelying shoulder abduction AROM Lt 1 lb 3 x 15    Sidelying shoulder ER c towel at side AROM Lt 3 x 15 1 lb c fatigue hold at end of set      Standing blue band rows X 20  Standing blue band GH ext x 20    Standing wall  slide in scaption (y shape) c lift off x 10    Standing ball circles in 90 deg 2 x 30 cw, ccw   Standing flexion 0-90 deg bilateral AROM 2X10   Standing abduction AROM bilat 2X10 with shorter lever arm (elbows bent)    Standing Doorway ER stretching in 90/90 15 sec x 5 Lt shoulder   05/28/2022             Therex:                         UBE AAROM fwd/back 4 mins each way lvl 3.0   Supine green band horizontal abduction 3 x 10          Sidelying shoulder abduction AROM Lt 1 lb 3 x 15    Sidelying shoulder ER c towel at side AROM Lt 3 x 15 1 lb c fatigue hold at end of set     Standing blue band GH ext x 20    Standing wall slide in scaption (y shape) c lift off x 10    Standing ball circles in 90 deg 2 x 30 cw, ccw   Standing Doorway ER stretching in 90/90 15 sec x 5 Lt shoulder    Standing flexion 0-90 deg bilateral superset (x10 together, x 10 c contralateral arm in 90 deg flexion hold, x 10 bilateral)    PATIENT EDUCATION: 06/11/2022 Education details: HEP progression Person educated: Patient Education method: Consulting civil engineer, Media planner, Verbal cues, and Handouts Education comprehension: verbalized understanding, returned demonstration, and verbal cues required     HOME EXERCISE PROGRAM: Access Code: [email or web address] URL: [email or web address] Date: 06/11/2022 Prepared by: Scot Jun  Exercises - Supine Shoulder External Rotation with Dowel  - 2-3 x daily - 7 x weekly - 1-2 sets - 10 reps - 5 hold - Seated Scapular Retraction  - 2-3 x daily - 7 x weekly - 1-2 sets - 10 reps - 5 hold - Sidelying Shoulder Abduction Palm Forward (Mirrored)  - 1-2 x daily - 7 x weekly - 2-3 sets - 10-15 reps - Sidelying Shoulder ER with Towel and Dumbbell (Mirrored)  - 1 x daily - 7 x weekly - 3 sets - 10 reps - Single Arm Doorway Pec Stretch at 90 Degrees Abduction (Mirrored)  - 1-2 x daily - 7 x weekly - 1 sets - 3-5 reps - 15 hold - Standing Shoulder Flexion to 90 Degrees with  Dumbbells  - 1 x daily - 7 x weekly - 3 sets - 10 reps - Standing Bilateral Low Shoulder Row with Anchored Resistance  - 1-2 x daily - 7 x weekly - 2-3  sets - 10-15 reps - Shoulder Extension with Resistance  - 1-2 x daily - 7 x weekly - 2-3 sets - 10-15 reps - Supine Shoulder Horizontal Abduction with Resistance  - 1-2 x daily - 7 x weekly - 2-3 sets - 10-15 reps   ASSESSMENT:   CLINICAL IMPRESSION: Pt had indicated 10 visits overall during treatment.  See objective data for updated information.  Pt has reported > 90 % improvement at this time.  Pt has made gains in all areas.  Pt may continue to improve c progressive resistance activity but has demonstrated good knowledge and performance of HEP and was appropriate for trial at home at this time. Trial HEP at this time c d/c after MD visit if not returning.      OBJECTIVE IMPAIRMENTS decreased activity tolerance, decreased coordination, decreased endurance, decreased mobility, decreased ROM, decreased strength, hypomobility, increased edema, impaired perceived functional ability, increased muscle spasms, impaired flexibility, impaired UE functional use, improper body mechanics, postural dysfunction, and pain.    ACTIVITY LIMITATIONS meal prep, cleaning, laundry, interpersonal relationship, driving, shopping, community activity, occupation, and yard work.    PERSONAL FACTORS Hypercholesterolemia, Pre-diabetes, hypertricglyceridemia, OA Lt hip are also affecting patient's functional outcome.      REHAB POTENTIAL: Good   CLINICAL DECISION MAKING: Stable/uncomplicated   EVALUATION COMPLEXITY: Low     GOALS: Goals reviewed with patient? Yes   Eval Date;  04/20/2022   Short term PT Goals (target date for Short term goals are 3 weeks 05/11/2022) Patient will demonstrate independent use of home exercise program to maintain progress from in clinic treatments. Goal status: MET 05/09/2022   Long term PT goals (target dates for all long term goals  are 10 weeks  06/29/2022 )   1. Patient will demonstrate/report pain at worst less than or equal to 2/10 to facilitate minimal limitation in daily activity secondary to pain symptoms. Goal status: MET - assessed 06/11/2022   2. Patient will demonstrate independent use of home exercise program to facilitate ability to maintain/progress functional gains from skilled physical therapy services. Goal status: MET - assessed 06/11/2022   3. Patient will demonstrate FOTO outcome > or = 61 % to indicate reduced disability due to condition. Goal status: MET - assessed 06/11/2022   4.  Patient will demonstrate Lt  [facility] joint mobility WFL to facilitate usual self care, dressing, reaching overhead at PLOF s limitation due to symptoms.  Goal status: MET - assessed 06/11/2022   5.  Patient will demonstrate Lt UE MMT 4/5 or greater throughout to facilitate usual lifting, carrying in functional activity to PLOF s limitation.   Goal status:MET - assessed 06/11/2022    6.  Patient will demonstrate/report ability to return to work at [employer].   Goal status: partially met - assessed 06/11/2022       PLAN: PT FREQUENCY: 1-2x/week   PT DURATION: 10 weeks   PLANNED INTERVENTIONS: Therapeutic exercises, Therapeutic activity, Neuro Muscular re-education, Balance training, Gait training, Patient/Family education, Joint mobilization, Stair training, DME instructions, Dry Needling, Electrical stimulation, Cryotherapy, Moist heat, Taping, Ultrasound, Ionotophoresis 39m/ml Dexamethasone, and Manual therapy.  All included unless contraindicated   PLAN FOR NEXT SESSION: Trial HEP.   MScot Jun PT, DPT, OCS, ATC 06/11/22  4:15 PM   PHYSICAL THERAPY DISCHARGE SUMMARY  Visits from Start of Care: 10  Current functional level related to goals / functional outcomes: See note   Remaining deficits: See note   Education / Equipment: HEP   Patient agrees to  discharge. Patient goals were  mostly met . Patient is being  discharged due to not returning since the last visit.  Scot Jun, PT, DPT, OCS, ATC 07/16/22  8:29 AM

## 2022-06-18 ENCOUNTER — Encounter: Payer: Self-pay | Admitting: Physician Assistant

## 2022-06-18 ENCOUNTER — Ambulatory Visit (INDEPENDENT_AMBULATORY_CARE_PROVIDER_SITE_OTHER): Payer: PRIVATE HEALTH INSURANCE

## 2022-06-18 ENCOUNTER — Ambulatory Visit (INDEPENDENT_AMBULATORY_CARE_PROVIDER_SITE_OTHER): Payer: PRIVATE HEALTH INSURANCE | Admitting: Physician Assistant

## 2022-06-18 DIAGNOSIS — M7062 Trochanteric bursitis, left hip: Secondary | ICD-10-CM

## 2022-06-18 NOTE — Progress Notes (Signed)
Office Visit Note   Patient: Ellen Sawyer           Date of Birth: 1955/12/06           MRN: [id number] Visit Date: 06/18/2022              Requested by: Richmond Campbell., PA-C [address],  [place] [address] PCP: Richmond Campbell., PA-C   Assessment & Plan: Visit Diagnoses:  1. Trochanteric bursitis of left hip     Plan: She shown IT band stretching exercises.  We will see her back in 2 weeks see how she does in regards to the injection.  Questions encouraged and answered at length.  Follow-Up Instructions: Return in about 2 weeks (around 07/02/2022).   Orders:  Orders Placed This Encounter  Procedures   XR HIP UNILAT W OR W/O PELVIS 1V LEFT   No orders of the defined types were placed in this encounter.     Procedures: No procedures performed   Clinical Data: No additional findings.   Subjective: Chief Complaint  Patient presents with   Left Hip - Pain    HPI [name] returns today due to left hip pain.  Causing her to limp.  She has had no known injury.  History of left total hip arthroplasty 02/11/2018.  Denies any fevers chills.  States she has some pain mainly over the lateral aspect of the hip but some groin pain.  Some anterior thigh pain.  Notes some numbness in the thigh.  Denies any new pain past the knee.  Ranks her pain to be 9 out of 10 pain at worst.  Pains been ongoing for the past month.  Review of Systems  Constitutional:  Negative for chills and fever.     Objective: Vital Signs: There were no vitals taken for this visit.  Physical Exam Constitutional:      Appearance: She is not ill-appearing or diaphoretic.  Pulmonary:     Effort: Pulmonary effort is normal.  Neurological:     Mental Status: She is alert and oriented to person, place, and time.     Ortho Exam Bilateral hips good range of motion of both hips.  Extremes of internal/external rotation left hip causes discomfort.  Tenderness over the left  trochanteric region.  Circumflexion of the left hip reveals fluid motion without pain.  Surgical incision left hip is well-healed no signs of infection. Specialty Comments:  No specialty comments available.  Imaging: XR HIP UNILAT W OR W/O PELVIS 1V LEFT  Result Date: 06/18/2022 AP pelvis lateral view left hip: No acute fracture.  Bilateral hips well located.  Status post left total hip arthroplasty well-seated components.    PMFS History: Patient Active Problem List   Diagnosis Date Noted   DOE (dyspnea on exertion) 09/08/2021   Cough variant asthma 09/08/2021   Status post total replacement of left hip 02/11/2018   Unilateral primary osteoarthritis, left hip 01/14/2018   Spondylolisthesis at L4-L5 level 09/09/2017   Past Medical History:  Diagnosis Date   Anxiety    DULoxetine (CYMBALTA)   Asthma    Chest discomfort    in the past   Chest pain    in the past   History of hiatal hernia    Hypercholesterolemia    Hypertriglyceridemia    Osteoarthritis of left hip    PONV (postoperative nausea and vomiting)    Pre-diabetes    year ago was on metformin, then she has been  of metforming for many years    Family History  Problem Relation Age of Onset   Coronary artery disease Father        58, several bypass   Dementia Father    Pancreatic cancer Mother     Past Surgical History:  Procedure Laterality Date   CARDIAC CATHETERIZATION     2008   CESAREAN SECTION  1986 and 1989   COLONOSCOPY     feet surgery  1990   four   herniated disc  1990 and 1992   two   LUMBAR LAMINECTOMY     rods screws, cadiver bone    TONSILLECTOMY     TOOTH EXTRACTION     TOTAL HIP ARTHROPLASTY Left 02/11/2018   TOTAL HIP ARTHROPLASTY Left 02/11/2018   Procedure: LEFT TOTAL HIP ARTHROPLASTY ANTERIOR APPROACH;  Surgeon: Kathryne Hitch, MD;  Location: [facility] OR;  Service: Orthopedics;  Laterality: Left;   WISDOM TOOTH EXTRACTION     Social History   Occupational History   Not on  file  Tobacco Use   Smoking status: Never   Smokeless tobacco: Never  Vaping Use   Vaping Use: Never used  Substance and Sexual Activity   Alcohol use: No   Drug use: No   Sexual activity: Not on file

## 2022-06-18 NOTE — Progress Notes (Signed)
L hip

## 2022-06-26 ENCOUNTER — Telehealth: Payer: Self-pay | Admitting: Physician Assistant

## 2022-06-26 NOTE — Telephone Encounter (Signed)
Patient called advised her left hip is not any better so she is going to schedule an appointment with her back doctor. The number to contact patient if needed is [phone]

## 2022-07-02 ENCOUNTER — Encounter: Payer: Self-pay | Admitting: Orthopedic Surgery

## 2022-07-02 ENCOUNTER — Ambulatory Visit (INDEPENDENT_AMBULATORY_CARE_PROVIDER_SITE_OTHER): Payer: PRIVATE HEALTH INSURANCE | Admitting: Orthopedic Surgery

## 2022-07-02 DIAGNOSIS — M79605 Pain in left leg: Secondary | ICD-10-CM

## 2022-07-02 NOTE — Progress Notes (Deleted)
   Post-Op Visit Note   Patient: Ellen Sawyer           Date of Birth: 04/27/1956           MRN: [id number] Visit Date: 07/02/2022 PCP: Richmond Campbell., PA-C   Assessment & Plan:  Chief Complaint:  Chief Complaint  Patient presents with   Left Hip - Follow-up   Visit Diagnoses:  1. Pain in left leg     Plan: ***  Follow-Up Instructions: Return if symptoms worsen or fail to improve.   Orders:  Orders Placed This Encounter  Procedures   MR Lumbar Spine w/o contrast   Ambulatory referral to Neurosurgery   No orders of the defined types were placed in this encounter.   Imaging: No results found.  PMFS History: Patient Active Problem List   Diagnosis Date Noted   DOE (dyspnea on exertion) 09/08/2021   Cough variant asthma 09/08/2021   Status post total replacement of left hip 02/11/2018   Unilateral primary osteoarthritis, left hip 01/14/2018   Spondylolisthesis at L4-L5 level 09/09/2017   Past Medical History:  Diagnosis Date   Anxiety    DULoxetine (CYMBALTA)   Asthma    Chest discomfort    in the past   Chest pain    in the past   History of hiatal hernia    Hypercholesterolemia    Hypertriglyceridemia    Osteoarthritis of left hip    PONV (postoperative nausea and vomiting)    Pre-diabetes    year ago was on metformin, then she has been of metforming for many years    Family History  Problem Relation Age of Onset   Coronary artery disease Father        44, several bypass   Dementia Father    Pancreatic cancer Mother     Past Surgical History:  Procedure Laterality Date   CARDIAC CATHETERIZATION     2008   CESAREAN SECTION  1986 and 1989   COLONOSCOPY     feet surgery  1990   four   herniated disc  1990 and 1992   two   LUMBAR LAMINECTOMY     rods screws, cadiver bone    TONSILLECTOMY     TOOTH EXTRACTION     TOTAL HIP ARTHROPLASTY Left 02/11/2018   TOTAL HIP ARTHROPLASTY Left 02/11/2018   Procedure: LEFT TOTAL HIP  ARTHROPLASTY ANTERIOR APPROACH;  Surgeon: Kathryne Hitch, MD;  Location: [facility] OR;  Service: Orthopedics;  Laterality: Left;   WISDOM TOOTH EXTRACTION     Social History   Occupational History   Not on file  Tobacco Use   Smoking status: Never   Smokeless tobacco: Never  Vaping Use   Vaping Use: Never used  Substance and Sexual Activity   Alcohol use: No   Drug use: No   Sexual activity: Not on file

## 2022-07-04 ENCOUNTER — Emergency Department (HOSPITAL_COMMUNITY): Payer: PRIVATE HEALTH INSURANCE

## 2022-07-04 ENCOUNTER — Ambulatory Visit: Payer: PRIVATE HEALTH INSURANCE | Admitting: Physician Assistant

## 2022-07-04 ENCOUNTER — Encounter (HOSPITAL_COMMUNITY): Payer: Self-pay

## 2022-07-04 ENCOUNTER — Other Ambulatory Visit: Payer: Self-pay

## 2022-07-04 ENCOUNTER — Emergency Department (HOSPITAL_COMMUNITY)
Admission: EM | Admit: 2022-07-04 | Discharge: 2022-07-04 | Disposition: A | Discharge status: Home / Self Care | Payer: PRIVATE HEALTH INSURANCE | Attending: Emergency Medicine | Admitting: Emergency Medicine

## 2022-07-04 DIAGNOSIS — Z7982 Long term (current) use of aspirin: Secondary | ICD-10-CM | POA: Insufficient documentation

## 2022-07-04 DIAGNOSIS — R1011 Right upper quadrant pain: Secondary | ICD-10-CM | POA: Diagnosis present

## 2022-07-04 DIAGNOSIS — R7401 Elevation of levels of liver transaminase levels: Secondary | ICD-10-CM | POA: Insufficient documentation

## 2022-07-04 DIAGNOSIS — K8050 Calculus of bile duct without cholangitis or cholecystitis without obstruction: Secondary | ICD-10-CM

## 2022-07-04 DIAGNOSIS — R7989 Other specified abnormal findings of blood chemistry: Secondary | ICD-10-CM

## 2022-07-04 LAB — CBC
HCT: 39.7 % (ref 36.0–46.0)
Hemoglobin: 12.6 g/dL (ref 12.0–15.0)
MCH: 26.4 pg (ref 26.0–34.0)
MCHC: 31.7 g/dL (ref 30.0–36.0)
MCV: 83.1 fL (ref 80.0–100.0)
Platelets: 248 10*3/uL (ref 150–400)
RBC: 4.78 MIL/uL (ref 3.87–5.11)
RDW: 15.7 % — ABNORMAL HIGH (ref 11.5–15.5)
WBC: 7.6 10*3/uL (ref 4.0–10.5)
nRBC: 0 % (ref 0.0–0.2)

## 2022-07-04 LAB — URINALYSIS, ROUTINE W REFLEX MICROSCOPIC
Bacteria, UA: NONE SEEN
Bilirubin Urine: NEGATIVE
Glucose, UA: NEGATIVE mg/dL
Hgb urine dipstick: NEGATIVE
Ketones, ur: NEGATIVE mg/dL
Nitrite: NEGATIVE
Protein, ur: NEGATIVE mg/dL
Specific Gravity, Urine: 1.006 (ref 1.005–1.030)
pH: 5 (ref 5.0–8.0)

## 2022-07-04 LAB — COMPREHENSIVE METABOLIC PANEL WITH GFR
ALT: 418 U/L — ABNORMAL HIGH (ref 0–44)
AST: 515 U/L — ABNORMAL HIGH (ref 15–41)
Albumin: 3.8 g/dL (ref 3.5–5.0)
Alkaline Phosphatase: 263 U/L — ABNORMAL HIGH (ref 38–126)
Anion gap: 8 (ref 5–15)
BUN: 13 mg/dL (ref 8–23)
CO2: 24 mmol/L (ref 22–32)
Calcium: 8.7 mg/dL — ABNORMAL LOW (ref 8.9–10.3)
Chloride: 106 mmol/L (ref 98–111)
Creatinine, Ser: 0.92 mg/dL (ref 0.44–1.00)
GFR, Estimated: >60 mL/min
Glucose, Bld: 126 mg/dL — ABNORMAL HIGH (ref 70–99)
Potassium: 3.9 mmol/L (ref 3.5–5.1)
Sodium: 138 mmol/L (ref 135–145)
Total Bilirubin: 1.5 mg/dL — ABNORMAL HIGH (ref 0.3–1.2)
Total Protein: 6.7 g/dL (ref 6.5–8.1)

## 2022-07-04 LAB — LIPASE, BLOOD: Lipase: 31 U/L (ref 11–51)

## 2022-07-04 MED ORDER — MORPHINE SULFATE (PF) 4 MG/ML IV SOLN
4.0000 mg | Freq: Once | INTRAVENOUS | Status: AC
  Administered 2022-07-04: 4 mg via INTRAVENOUS
  Filled 2022-07-04: qty 1

## 2022-07-04 MED ORDER — SODIUM CHLORIDE 0.9 % IV BOLUS
1000.0000 mL | Freq: Once | INTRAVENOUS | Status: AC
  Administered 2022-07-04: 1000 mL via INTRAVENOUS

## 2022-07-04 MED ORDER — ONDANSETRON 4 MG PO TBDP: ORAL_TABLET | ORAL | 0 refills | Status: AC

## 2022-07-04 MED ORDER — ONDANSETRON HCL 4 MG/2ML IJ SOLN
4.0000 mg | Freq: Once | INTRAMUSCULAR | Status: AC
  Administered 2022-07-04: 4 mg via INTRAVENOUS
  Filled 2022-07-04: qty 2

## 2022-07-04 MED ORDER — OXYCODONE-ACETAMINOPHEN 5-325 MG PO TABS: 1.0000 | ORAL_TABLET | Freq: Four times a day (QID) | ORAL | 0 refills | Status: DC | PRN

## 2022-07-04 MED ORDER — IOHEXOL 300 MG/ML  SOLN
100.0000 mL | Freq: Once | INTRAMUSCULAR | Status: AC | PRN
  Administered 2022-07-04: 100 mL via INTRAVENOUS

## 2022-07-04 MED ORDER — ONDANSETRON 4 MG PO TBDP
4.0000 mg | ORAL_TABLET | Freq: Once | ORAL | Status: AC
  Administered 2022-07-04: 4 mg via ORAL
  Filled 2022-07-04: qty 1

## 2022-07-04 NOTE — ED Provider Notes (Signed)
[facility] DEPT Provider Note   CSN: [id number] Arrival date & time: 07/04/22  1659     History  Chief Complaint  Patient presents with   Abdominal Pain    Ellen Sawyer is a 66 y.o. female with no pertinent medical history presents to the emergency department for abdominal pain.  Patient reports that yesterday at 1900, patient developed this sharp while she was refilling her bird feeder.  She states that yesterday she ate waffles for breakfast and ate a pimento cheese sandwich for lunch.  She did not eat after that.  She does report that when she got this abdominal pain, she tried to eat bread and Coke, which she threw up.  She did not eat for the rest the night.  This morning, she states that she ate waffles and did not throw those up.  She states that she went to work, and states that she works at [employer] where she got some blood work done.  Patient blood work resulted with elevated AST at 679, ALT at 355, elevated alk phos at 285, and elevated bilirubin at 1.5.  Lipase was negative.  Patient was then told to come to the emergency room to be evaluated.  Upon arrival, she states that she vomited 1 time during transport.  She denies any vomiting now.  Patient now complaining of minor abdominal pain to her right upper quadrant and is a 3-4/10 in severity.  She denies any pain radiating up to her shoulder to her back.  She denies any chest pain.  She denies any alcohol use, tobacco use, or IV drug use.  Abdominal Pain Associated symptoms: chills, nausea and vomiting   Associated symptoms: no chest pain, no diarrhea, no fever and no shortness of breath        Home Medications Prior to Admission medications   Medication Sig Start Date End Date Taking? Authorizing Provider  albuterol (VENTOLIN HFA) 108 (90 Base) MCG/ACT inhaler Inhale 1 puff into the lungs every 6 (six) hours as needed for shortness of breath. 09/06/21  Yes [provider]   aspirin 81 MG EC tablet TAKE 1 TABLET BY MOUTH EVERY DAY Patient taking differently: Take 81 mg by mouth daily. 04/14/18  Yes Mcarthur Rossetti, MD  b complex vitamins tablet Take 1 tablet by mouth daily.   Yes [provider]  budesonide-formoterol (SYMBICORT) 80-4.5 MCG/ACT inhaler Take 2 puffs first thing in am and then another 2 puffs about 12 hours later. Patient taking differently: Inhale 2 puffs into the lungs 2 (two) times daily. 09/08/21  Yes Tanda Rockers, MD  DULoxetine (CYMBALTA) 30 MG capsule Take 30 mg by mouth daily.   Yes [provider]  DULoxetine (CYMBALTA) 60 MG capsule Take 60 mg by mouth daily.   Yes [provider]  famotidine (PEPCID) 20 MG tablet One after supper Patient taking differently: Take 20 mg by mouth daily as needed for heartburn. 09/08/21  Yes Tanda Rockers, MD  levothyroxine (SYNTHROID, LEVOTHROID) 50 MCG tablet Take 50 mcg by mouth daily before breakfast.    Yes [provider]  LORazepam (ATIVAN) 0.5 MG tablet Take 0.5 mg by mouth 2 (two) times daily as needed for anxiety. 01/08/22  Yes [provider]  methocarbamol (ROBAXIN) 500 MG tablet Take 1 tablet (500 mg total) by mouth every 8 (eight) hours as needed. Patient taking differently: Take 500 mg by mouth every 8 (eight) hours as needed for muscle spasms. 04/16/22  Yes  Magnant, Charles L, PA-C  ondansetron (ZOFRAN-ODT) 4 MG disintegrating tablet 62m ODT q4 hours prn nausea/vomit 07/04/22  Yes YDrenda Freeze MD  oxyCODONE-acetaminophen (PERCOCET) 5-325 MG tablet Take 1 tablet by mouth every 6 (six) hours as needed. 07/04/22  Yes YDrenda Freeze MD  Vitamin D, Ergocalciferol, (DRISDOL) 50000 units CAPS capsule Take 50,000 Units by mouth every 7 (seven) days. Saturdays 03/08/17  Yes [provider]  celecoxib (CELEBREX) 100 MG capsule Take 1 capsule (100 mg total) by mouth 2 (two) times daily. Patient not taking: Reported on 07/04/2022 03/19/22 03/19/23   Magnant, CGerrianne Scale PA-C      Allergies    Ceftin [cefuroxime axetil] and Cinobac [cinoxacin]    Review of Systems   Review of Systems  Constitutional:  Positive for chills. Negative for fever.  Respiratory:  Negative for shortness of breath.   Cardiovascular:  Negative for chest pain.  Gastrointestinal:  Positive for abdominal pain, nausea and vomiting. Negative for diarrhea.    Physical Exam Updated Vital Signs BP (!) 152/77   Pulse 82   Temp 98.7 F (37.1 C) (Oral)   Resp 17   Ht 5' (1.524 m)   Wt 86.2 kg   SpO2 100%   BMI 37.11 kg/m  Physical Exam  General: Alert and orientated x3. Patient is resting comfortably in bed. Eyes:  EOM intact  Head: Normocephalic, atraumatic  Cardio: Regular rate and rhythm, no murmurs, rubs or gallops. 2+ pulses to bilateral upper and lower extremities  Pulmonary: Clear to ausculation bilaterally with no rales, rhonchi, and crackles  Abdomen: Soft, with normoactive bowel sounds with some right upper quadrant tenderness noted.  ED Results / Procedures / Treatments   Labs (all labs ordered are listed, but only abnormal results are displayed) Labs Reviewed  URINALYSIS, ROUTINE W REFLEX MICROSCOPIC - Abnormal; Notable for the following components:      Result Value   Leukocytes,Ua TRACE (*)    All other components within normal limits  COMPREHENSIVE METABOLIC PANEL - Abnormal; Notable for the following components:   Glucose, Bld 126 (*)    Calcium 8.7 (*)    AST 515 (*)    ALT 418 (*)    Alkaline Phosphatase 263 (*)    Total Bilirubin 1.5 (*)    All other components within normal limits  CBC - Abnormal; Notable for the following components:   RDW 15.7 (*)    All other components within normal limits  LIPASE, BLOOD  HEPATITIS PANEL, ACUTE  ACETAMINOPHEN LEVEL    EKG EKG Interpretation  Date/Time:  Wednesday July 04 2022 21:01:30 EDT Ventricular Rate:  87 PR Interval:  123 QRS Duration: 88 QT Interval:  326 QTC  Calculation: 390 R Axis:   45 Text Interpretation: Sinus rhythm Minimal ST elevation, anterior leads No significant change since last tracing Confirmed by YWandra Arthurs([phone] on 07/04/2022 9:32:57 PM  Radiology CT ABDOMEN PELVIS W CONTRAST  Result Date: 07/04/2022 CLINICAL DATA:  Nausea, vomiting, right upper quadrant pain, and elevated liver function studies. EXAM: CT ABDOMEN AND PELVIS WITH CONTRAST TECHNIQUE: Multidetector CT imaging of the abdomen and pelvis was performed using the standard protocol following bolus administration of intravenous contrast. RADIATION DOSE REDUCTION: This exam was performed according to the departmental dose-optimization program which includes automated exposure control, adjustment of the mA and/or kV according to patient size and/or use of iterative reconstruction technique. CONTRAST:  1066mOMNIPAQUE IOHEXOL 300 MG/ML  SOLN COMPARISON:  Ultrasound abdomen 07/04/2022 FINDINGS: Lower chest:  Lung bases are clear. Hepatobiliary: No focal liver abnormality is seen. No gallstones, gallbladder wall thickening, or biliary dilatation. Pancreas: The pancreas is diffusely decreased in attenuation. No pancreatic ductal dilatation or peripancreatic stranding. Changes may represent normal variation versus early fatty infiltration or may indicate early changes of acute pancreatitis. Correlate with laboratory evaluation to exclude pancreatitis. Spleen: Normal in size without focal abnormality. Adrenals/Urinary Tract: Adrenal glands are unremarkable. Kidneys are normal, without renal calculi, focal lesion, or hydronephrosis. Bladder is unremarkable. Stomach/Bowel: Stomach is within normal limits. Appendix appears normal. No evidence of bowel wall thickening, distention, or inflammatory changes. Vascular/Lymphatic: Aortic atherosclerosis. No enlarged abdominal or pelvic lymph nodes. Reproductive: Uterus and bilateral adnexa are unremarkable. Other: No free air or free fluid in the abdomen.  Abdominal wall musculature appears intact. Musculoskeletal: Postoperative right hip arthroplasty. Postoperative changes in the lower lumbar spine. No acute bony abnormalities. IMPRESSION: 1. Mild low-attenuation change throughout the pancreas without definitive inflammatory changes or ductal dilatation. Changes are nonspecific but could indicate early acute changes of pancreatitis. Correlate with laboratory evaluation. 2. No evidence of bowel obstruction or inflammation. Appendix is normal. Electronically Signed   By: Lucienne Capers M.D.   On: 07/04/2022 22:35   US Abdomen Limited RUQ (LIVER/GB)  Result Date: 07/04/2022 CLINICAL DATA:  Right upper quadrant pain since last night. EXAM: ULTRASOUND ABDOMEN LIMITED RIGHT UPPER QUADRANT COMPARISON:  Right upper quadrant ultrasound 08/10/2011 FINDINGS: Gallbladder: No gallstones or wall thickening visualized. No sonographic Murphy sign noted by sonographer. Common bile duct: Diameter: 7 mm, upper limits of normal. No duct stones are seen. There is no intrahepatic biliary ductal dilatation. Liver: No focal lesion identified. Within normal limits in parenchymal echogenicity. Portal vein is patent on color Doppler imaging with normal direction of blood flow towards the liver. Other: None. IMPRESSION: Normal right upper quadrant ultrasound. Electronically Signed   By: Valetta Mole M.D.   On: 07/04/2022 18:33    Procedures Procedures    Medications Ordered in ED Medications  ondansetron (ZOFRAN-ODT) disintegrating tablet 4 mg (4 mg Oral Given 07/04/22 1744)  morphine (PF) 4 MG/ML injection 4 mg (4 mg Intravenous Given 07/04/22 2158)  sodium chloride 0.9 % bolus 1,000 mL (1,000 mLs Intravenous New Bag/Given 07/04/22 2155)  ondansetron (ZOFRAN) injection 4 mg (4 mg Intravenous Given 07/04/22 2156)  iohexol (OMNIPAQUE) 300 MG/ML solution 100 mL (100 mLs Intravenous Contrast Given 07/04/22 2219)    ED Course/ Medical Decision Making/ A&P                            Medical Decision Making This is a 66 year old female presenting with right upper quadrant abdominal pain.  She states that this started yesterday.  It is also associated with nausea and vomiting.  It is unsure which foods trigger this.  Patient denies any IV drug use or alcohol use.  I want to ensure that this is not acute cholecystitis.  Given her elevated liver enzymes, this could be choledocholithiasis.  Lipase was negative, can rule out pancreatitis.  Patient did come in with elevated blood pressures, and abdominal pain leading me to think of aortic dissection, but is less likely.  Elevated blood pressure likely to pain.  We will get repeat CMP and will also get CBC, gallbladder ultrasound, CT abdomen. We will also get lipase, acetaminophen level, UA,.  On exam, patient does look pretty comfortable without the administration of any pain medication.  This could be because the possible  stone may have passed by now.  Amount and/or Complexity of Data Reviewed External Data Reviewed: labs.    Details: Labs from [place] showing elevated AST, ALT, alk phos, total bili Labs: ordered.    Details: CMP, CBC, UA, acetaminophen level and lipase Radiology: ordered.    Details: Gallbladder ultrasound and CT abdomen ECG/medicine tests: ordered.  Risk Prescription drug management.   2130: CBC within normal limits.  UA within normal limits.  CMP showing elevated AST of 515 and ALT of 418.  Alk phos 263.  Today T. bili 1.5.  Lipase negative. Gallbladder ultrasound negative for any stones or gallbladder sludge, but did show 27m bile duct which is the upper limit of normal.  This could be that the stone has passed and therefore was not visualized on gallbladder ultrasound.  Given that the AST and ALT are decreasing from previous, stone could have passed completely.  On exam, patient is looking comfortable.  Obtain CT abdomen to visualize stone in CBD if there is one.  Acetaminophen levels and hepatitis panel  pending.  CT abdomen pelvis showing no signs of acute cholecystitis or no stones.  Likely what happened was that she had a stone, it got stuck, and then passed on its own.  On reassessment, patient is doing better after receiving morphine.  She denies any nausea at this time. No indication at this time for any surgery or admission. Patient is stable to discharge home.  Patient will get pain medicine and nausea medicine to take home. Patient to follow up outpatient with general surgery for elective cholecystectomy.         Final Clinical Impression(s) / ED Diagnoses Final diagnoses:  Biliary colic  Elevated LFTs    Rx / DC Orders ED Discharge Orders          Ordered    oxyCODONE-acetaminophen (PERCOCET) 5-325 MG tablet  Every 6 hours PRN        07/04/22 2249    ondansetron (ZOFRAN-ODT) 4 MG disintegrating tablet        07/04/22 2249              PLeigh Aurora DO 07/04/22 2259    YDrenda Freeze MD 07/04/22 [phone]

## 2022-07-04 NOTE — ED Provider Triage Note (Signed)
Emergency Medicine Provider Triage Evaluation Note  Ellen Sawyer , a 66 y.o. female  was evaluated in triage.  Pt complains of right upper quadrant pain.  Reports it started yesterday while she was at a birthday party and doubled her over.  No history of abdominal surgery.  Says she has been nauseated and throwing up bile ever since.  No diarrhea.  Works at a [employer] and had lab work done.  Elevated liver enzymes.   Review of Systems  Positive: Nausea, vomiting, abdominal pain Negative: Diarrhea  Physical Exam  BP (!) 181/99   Pulse 100   Temp 98.5 F (36.9 C) (Oral)   Resp 18   Ht 5' (1.524 m)   Wt 86.2 kg   SpO2 97%   BMI 37.11 kg/m  Gen:   Awake, no distress   Resp:  Normal effort  MSK:   Moves extremities without difficulty  Other:  Right upper quadrant tenderness.  No other tenderness.  Well-appearing.  Medical Decision Making  Medically screening exam initiated at 5:37 PM.  Appropriate orders placed.  CHYREL TAHA was informed that the remainder of the evaluation will be completed by another provider, this initial triage assessment does not replace that evaluation, and the importance of remaining in the ED until their evaluation is complete.  RUQ ultrasound ordered.   Ellen Benders, PA-C 07/04/22 1742

## 2022-07-04 NOTE — Discharge Instructions (Addendum)
Ellen Sawyer,Thank you for allowing me to take part in your care today.  Here is what we discussed today  1.  Regarding your abdominal pain, nausea, and vomiting, this was probably due to a gallstone.  It seems to have passed given your lab numbers getting better and symptoms getting better. Plan would be for you to follow-up outpatient with general surgery, to schedule elective cholecystectomy.  2.  I have prescribed you pain medicine and nausea medicine to pick up at your pharmacy.  3. If you develop a fever, chills, worsening abdominal pain, please return to the emergency room.  Thank you, Dr. Allena Katz

## 2022-07-04 NOTE — ED Triage Notes (Signed)
Patient c/o abdmoinal pain under the right breast. Patient stated the pain has increased over time. Patient states she is unable to hold anything down. Patient states she has been nauseas and had 1 emesis episode. Patient was seen at PCP where she was told to come here.

## 2022-07-07 NOTE — Progress Notes (Signed)
Post-Op Visit Note   Patient: Ellen Sawyer           Date of Birth: 08-26-1956           MRN: [id number] Visit Date: 07/02/2022 PCP: Richmond Campbell., PA-C   Assessment & Plan:  Chief Complaint:  Chief Complaint  Patient presents with   Left Hip - Follow-up   Visit Diagnoses:  1. Pain in left leg     Plan: Ellen Sawyer is a 66 year old patient here for 6-week follow-up after left shoulder rotator cuff tear repair and biceps tenodesis.  Works at [employer].  Working on Print production planner.  Has been going to physical therapy and then transition to a home exercise program.  On examination of the left shoulder she has passive range of motion of 50/100/170.  4+ out of 5 infraspinatus strength with 5 out of 5 subscap strength.  Patient also reports having a fall on her hip 2 months ago.  Reports pain running down her left leg with numbness and tingling.  She has fallen twice due to weakness in that leg.  She has some groin pain as well.  Had hip replacement 2 years ago.  Dr. Magnus Ivan did that case.  Dr. Danielle Dess is her back surgeon.  Examination demonstrates mild nerve root tension signs on the left negative on the right.  No paresthesias L1-S1 bilaterally.  No discrete groin pain with internal/external Tatian of the leg.  Has a lot of hip and buttock pain.  Plan is MRI lumbar spine left radiculopathy.  Follow-up with Dr. Danielle Dess after the MRI scan.  She essentially had a left hip injection for bursitis 05/21/2022 with no relief.  I think this is likely coming from her back.  Shoulder is doing well.  Follow-up with [place] as needed.  Follow-Up Instructions: Return if symptoms worsen or fail to improve.   Orders:  Orders Placed This Encounter  Procedures   MR Lumbar Spine w/o contrast   Ambulatory referral to Neurosurgery   No orders of the defined types were placed in this encounter.   Imaging: No results found.  PMFS History: Patient Active Problem List   Diagnosis Date  Noted   DOE (dyspnea on exertion) 09/08/2021   Cough variant asthma 09/08/2021   Status post total replacement of left hip 02/11/2018   Unilateral primary osteoarthritis, left hip 01/14/2018   Spondylolisthesis at L4-L5 level 09/09/2017   Past Medical History:  Diagnosis Date   Anxiety    DULoxetine (CYMBALTA)   Asthma    Chest discomfort    in the past   Chest pain    in the past   History of hiatal hernia    Hypercholesterolemia    Hypertriglyceridemia    Osteoarthritis of left hip    PONV (postoperative nausea and vomiting)    Pre-diabetes    year ago was on metformin, then she has been of metforming for many years    Family History  Problem Relation Age of Onset   Coronary artery disease Father        38, several bypass   Dementia Father    Pancreatic cancer Mother     Past Surgical History:  Procedure Laterality Date   CARDIAC CATHETERIZATION     2008   CESAREAN SECTION  1986 and 1989   COLONOSCOPY     feet surgery  1990   four   herniated disc  1990 and 1992   two   LUMBAR LAMINECTOMY  rods screws, cadiver bone    TONSILLECTOMY     TOOTH EXTRACTION     TOTAL HIP ARTHROPLASTY Left 02/11/2018   TOTAL HIP ARTHROPLASTY Left 02/11/2018   Procedure: LEFT TOTAL HIP ARTHROPLASTY ANTERIOR APPROACH;  Surgeon: Kathryne Hitch, MD;  Location: [facility] OR;  Service: Orthopedics;  Laterality: Left;   WISDOM TOOTH EXTRACTION     Social History   Occupational History   Not on file  Tobacco Use   Smoking status: Never   Smokeless tobacco: Never  Vaping Use   Vaping Use: Never used  Substance and Sexual Activity   Alcohol use: No   Drug use: No   Sexual activity: Not on file

## 2022-07-10 ENCOUNTER — Telehealth (HOSPITAL_BASED_OUTPATIENT_CLINIC_OR_DEPARTMENT_OTHER): Payer: Self-pay

## 2022-07-24 ENCOUNTER — Encounter: Payer: Self-pay | Admitting: *Deleted

## 2023-01-24 ENCOUNTER — Other Ambulatory Visit: Payer: Self-pay | Admitting: Physician Assistant

## 2023-01-24 DIAGNOSIS — M25512 Pain in left shoulder: Secondary | ICD-10-CM

## 2023-02-07 ENCOUNTER — Encounter: Payer: Self-pay | Admitting: Radiology

## 2023-02-11 ENCOUNTER — Ambulatory Visit (INDEPENDENT_AMBULATORY_CARE_PROVIDER_SITE_OTHER): Payer: Medicare Other

## 2023-02-11 ENCOUNTER — Ambulatory Visit: Payer: Medicare Other | Admitting: Physician Assistant

## 2023-02-11 ENCOUNTER — Ambulatory Visit: Payer: Self-pay

## 2023-02-11 ENCOUNTER — Encounter: Payer: Self-pay | Admitting: Physician Assistant

## 2023-02-11 ENCOUNTER — Ambulatory Visit (INDEPENDENT_AMBULATORY_CARE_PROVIDER_SITE_OTHER): Payer: Medicare Other | Admitting: Sports Medicine

## 2023-02-11 DIAGNOSIS — M25551 Pain in right hip: Secondary | ICD-10-CM

## 2023-02-11 DIAGNOSIS — M5441 Lumbago with sciatica, right side: Secondary | ICD-10-CM | POA: Diagnosis not present

## 2023-02-11 DIAGNOSIS — M1611 Unilateral primary osteoarthritis, right hip: Secondary | ICD-10-CM | POA: Diagnosis not present

## 2023-02-11 MED ORDER — METHYLPREDNISOLONE ACETATE 40 MG/ML IJ SUSP
80.0000 mg | INTRAMUSCULAR | Status: AC | PRN
  Administered 2023-02-11: 80 mg via INTRA_ARTICULAR

## 2023-02-11 MED ORDER — LIDOCAINE HCL 1 % IJ SOLN
4.0000 mL | INTRAMUSCULAR | Status: AC | PRN
  Administered 2023-02-11: 4 mL

## 2023-02-11 NOTE — Progress Notes (Signed)
HPI: Ellen Sawyer comes in today due to right hip pain has been ongoing for the past 4 weeks no known injury.  She points to the posterior lateral aspect of the right hip and groin area as source of most of her pain.  She does however have occasional numbness from the knee down into the right foot.  She feels that her right leg is giving way.  States that the numbness usually occurs at the end of the day after working.  No waking back pain but the hip pain does awaken her.  She has a history of her prior lumbar surgery by Dr. Ellene Route.  She has been taking Advil and some tramadol that she had.    Review of systems: See HPI  Physical exam: General well-developed well-nourished female no acute distress.  Ambulates with a cane. Lower extremities: 5 out of 5 strength throughout lower extremities.  Negative straight leg raise bilaterally.  Full sensation bilateral feet.  Dorsal pedal pulses are 2+ and equal and symmetric. Left hip: Good range of motion without pain.  Right hip she has pain with external and internal rotation.  No tenderness over the right trochanteric region.  Radiographs: Lumbar spine 2 views: Status post L3-4 and L4-5 fusions without hardware failure.  Upper lumbar disc space overall well-maintained.  Endplate spurring [id number].  No acute fractures.  AP pelvis and lateral view of the right hip: Bilateral hips well located.  Status post left total hip arthroplasty low seated.  No acute fractures.  Right hip joint moderate narrowing.   Impression: Right hip arthritis Right leg radicular pain   Plan: Given patient's significant right groin pain we will try an intra-articular injection both for diagnostic and hopefully therapeutic purposes.  See her back 2 weeks after the injection and see how she is doing overall.  She sent for intra-articular injection of the right hip today with Dr. Rolena Infante under ultrasound.

## 2023-02-11 NOTE — Progress Notes (Signed)
   Procedure Note  Patient: Ellen Sawyer             Date of Birth: 1956/06/22           MRN: [id number]             Visit Date: 02/11/2023  Procedures: Visit Diagnoses:  1. Pain in right hip   2. Unilateral primary osteoarthritis, right hip    Large Joint Inj: R hip joint on 02/11/2023 9:35 AM Indications: pain Details: 22 G 3.5 in needle, ultrasound-guided anterior approach Medications: 4 mL lidocaine 1 %; 80 mg methylPREDNISolone acetate 40 MG/ML Outcome: tolerated well, no immediate complications  Procedure: US-guided intra-articular hip injection, right After discussion on risks/benefits/indications and informed verbal consent was obtained, a timeout was performed. Patient was lying supine on exam table. The hip was cleaned with betadine and alcohol swabs. Then utilizing ultrasound guidance, the patient's femoral head and neck junction was identified and subsequently injected with 4:2 lidocaine:depomedrol via an in-plane approach with ultrasound visualization of the injectate administered into the hip joint. Patient tolerated procedure well without immediate complications.  Procedure, treatment alternatives, risks and benefits explained, specific risks discussed. Consent was given by the patient. Immediately prior to procedure a time out was called to verify the correct patient, procedure, equipment, support staff and site/side marked as required. Patient was prepped and draped in the usual sterile fashion.   - I evaluated the patient about 10 minutes post-injection and she had good improvement in pain and range of motion - follow-up with Artis Delay and Dr. Ninfa Linden as indicated; I am happy to see them as needed  Elba Barman, DO [place]  This note was dictated using Dragon naturally speaking software and may contain errors in syntax, spelling, or content which have not been identified prior to signing this note.

## 2023-03-11 ENCOUNTER — Ambulatory Visit (INDEPENDENT_AMBULATORY_CARE_PROVIDER_SITE_OTHER): Payer: Medicare Other | Admitting: Physician Assistant

## 2023-03-11 ENCOUNTER — Encounter: Payer: Self-pay | Admitting: Physician Assistant

## 2023-03-11 DIAGNOSIS — M1611 Unilateral primary osteoarthritis, right hip: Secondary | ICD-10-CM | POA: Diagnosis not present

## 2023-03-11 NOTE — Progress Notes (Signed)
HPI: [name] comes in today for follow-up status post right hip intra articular injection with Dr. Shon Baton on 02/11/2023.  She states that for about a week she had 65 to 70% relief.  However pain is returned.  Leg is giving way.  She is not using any assistive device to ambulate.  States she takes ibuprofen during the day and occasional oxycodone that she had leftover from prior procedure.  Review of systems: See HPI otherwise negative  Physical exam: General well-developed well-nourished female no acute distress.  Mood and affect appropriate. Right hip: Good range of motion with pain she has wincing pain with internal or external rotation.  She ambulates with a slight antalgic gait on the right.  Left hip full range of motion without pain.  Impression: Right hip osteoarthritis History of left total hip arthroplasty  Plan: She would like to come back in June to discuss either surgery or repeat intra-articular injection.  In the interim advised her to use a cane.  She is given a work note stating that she needs to use a cane when up ambulating.  Questions were encouraged and answered at length.

## 2023-05-06 ENCOUNTER — Ambulatory Visit (INDEPENDENT_AMBULATORY_CARE_PROVIDER_SITE_OTHER): Payer: Medicare Other | Admitting: Orthopaedic Surgery

## 2023-05-06 ENCOUNTER — Encounter: Payer: Self-pay | Admitting: Orthopaedic Surgery

## 2023-05-06 VITALS — Ht 60.0 in | Wt 183.0 lb

## 2023-05-06 DIAGNOSIS — M1611 Unilateral primary osteoarthritis, right hip: Secondary | ICD-10-CM | POA: Diagnosis not present

## 2023-05-06 DIAGNOSIS — M25551 Pain in right hip: Secondary | ICD-10-CM | POA: Diagnosis not present

## 2023-05-06 MED ORDER — METHOCARBAMOL 500 MG PO TABS
500.0000 mg | ORAL_TABLET | Freq: Three times a day (TID) | ORAL | 1 refills | Status: DC | PRN
Start: 2023-05-06 — End: 2023-07-03

## 2023-05-06 NOTE — Progress Notes (Signed)
The patient is very well-known to the practice.  We replaced her left hip successfully in 2019.  She has been following up with significant hip pain in her right hip and known osteoarthritis of the right hip.  She did have an intra-articular steroid injection and has worked on weight loss and activity modification.  The steroid injection did help temporize her pain for several days but her pain is still back and in the groin.  At this point it can be 10 out of 10 and it is detrimentally affecting her mobility, her quality of life and her actives daily living.  She does take Robaxin on occasion and that does help some.  I did review all of her medications and past medical history within epic.  Her left operative hip moves smoothly and fluidly.  Her right hip has no blocks to rotation but significant pain in the groin with internal and external rotation as well as flexion of that right hip.  Again previous x-rays show severe osteoarthritis of the right hip with joint space narrowing and sclerotic changes as well as osteophytes.  Having had a left hip replacement before, she is fully aware of what the surgery involves.  She understands the risks and benefits of the surgery and what to expect from an intraoperative and postoperative course.  At this point she wishes to have this scheduled.  Will send in some Robaxin and work on scheduling her for a right total hip arthroplasty.

## 2023-06-17 ENCOUNTER — Ambulatory Visit (INDEPENDENT_AMBULATORY_CARE_PROVIDER_SITE_OTHER): Payer: No Typology Code available for payment source | Admitting: Physician Assistant

## 2023-06-17 ENCOUNTER — Encounter: Payer: Self-pay | Admitting: Physician Assistant

## 2023-06-17 ENCOUNTER — Other Ambulatory Visit (INDEPENDENT_AMBULATORY_CARE_PROVIDER_SITE_OTHER): Payer: Medicare Other

## 2023-06-17 DIAGNOSIS — M25512 Pain in left shoulder: Secondary | ICD-10-CM

## 2023-06-17 DIAGNOSIS — M5412 Radiculopathy, cervical region: Secondary | ICD-10-CM | POA: Diagnosis not present

## 2023-06-17 DIAGNOSIS — M542 Cervicalgia: Secondary | ICD-10-CM | POA: Diagnosis not present

## 2023-06-17 MED ORDER — GABAPENTIN 300 MG PO CAPS: 300.0000 mg | ORAL_CAPSULE | Freq: Every day | ORAL | 1 refills | Status: AC

## 2023-06-17 NOTE — Addendum Note (Signed)
Addended by: Barbette Or on: 06/17/2023 04:58 PM   Modules accepted: Orders

## 2023-06-17 NOTE — Progress Notes (Signed)
Office Visit Note   Patient: Ellen Sawyer           Date of Birth: [birth year]           MRN: [id number] Visit Date: 06/17/2023              Requested by: Richmond Campbell., PA-C [address]  [place] [address] PCP: Richmond Campbell., PA-C   Assessment & Plan: Visit Diagnoses:  1. Acute pain of left shoulder   2. Radiculopathy, cervical region     Plan: Will send her for MRI of her cervical spine given the radicular symptoms she is having down the arm.  Would like to have this performed prior to her hip surgery so we can address any cervical problems preoperatively.  Have her follow-up after the MRI to go over results and discuss further treatment.  Placed her on Neurontin she has Robaxin she can also take.  Follow-Up Instructions: Return after MRI, for After MRI.   Orders:  Orders Placed This Encounter  Procedures   XR Shoulder Left   Meds ordered this encounter  Medications   gabapentin (NEURONTIN) 300 MG capsule    Sig: Take 1 capsule (300 mg total) by mouth at bedtime.    Dispense:  30 capsule    Refill:  1      Procedures: No procedures performed   Clinical Data: No additional findings.   Subjective: Chief Complaint  Patient presents with   Left Shoulder - Pain    HPI Ellen Sawyer comes in today due to left shoulder pain.  She states that the shoulder pain began whenever she was starting to type couple weeks ago at work.  She had no particular injury.  She notes that she had numbness and tingling that went down her arm and continues to occur down the entire arm going into her fingers.  History of left shoulder arthroscopy with rotator cuff repair and biceps tenodesis on 03/19/2022 with Dr. August Saucer.  States she has had some pain in the shoulder since that time.  She denies any neck pain.  Review of Systems See HPI  Objective: Vital Signs: There were no vitals taken for this visit.  Physical Exam Constitutional:       Appearance: She is not ill-appearing or diaphoretic.  Pulmonary:     Effort: Pulmonary effort is normal.  Neurological:     Mental Status: She is alert.  Psychiatric:        Mood and Affect: Mood normal.     Ortho Exam Cervical spine full range of motion without pain.  Negative Spurling's.  Tenderness medial border for the left scapula. Upper extremities: Bilateral hands full motor.  Subjective decrease sensation second through fourth fingers on left hand only.  Biceps tricep strength 5 out of 5 bilaterally.  Right shoulder full overhead motion left shoulder lacks the last 15 to 20 degrees actively.  Passively she comes within 10 degrees of full overhead activity on the left.  Positive impingement left shoulder.  Weakness left shoulder with external rotation against resistance.  Specialty Comments:  No specialty comments available.  Imaging: XR Shoulder Left  Result Date: 06/17/2023 Left shoulder 3 views: Shoulder is well located.  No acute fracture or acute findings.  Mild narrowing on the axillary view.    PMFS History: Patient Active Problem List   Diagnosis Date Noted   DOE (dyspnea on exertion) 09/08/2021   Cough variant asthma 09/08/2021   Status  post total replacement of left hip 02/11/2018   Unilateral primary osteoarthritis, left hip 01/14/2018   Spondylolisthesis at L4-L5 level 09/09/2017   Past Medical History:  Diagnosis Date   Anxiety    DULoxetine (CYMBALTA)   Asthma    Chest discomfort    in the past   Chest pain    in the past   History of hiatal hernia    Hypercholesterolemia    Hypertriglyceridemia    Osteoarthritis of left hip    PONV (postoperative nausea and vomiting)    Pre-diabetes    year ago was on metformin, then she has been of metforming for many years    Family History  Problem Relation Age of Onset   Coronary artery disease Father        17, several bypass   Dementia Father    Pancreatic cancer Mother     Past Surgical History:   Procedure Laterality Date   CARDIAC CATHETERIZATION     2008   CESAREAN SECTION  1986 and 1989   COLONOSCOPY     feet surgery  1990   four   herniated disc  1990 and 1992   two   LUMBAR LAMINECTOMY     rods screws, cadiver bone    TONSILLECTOMY     TOOTH EXTRACTION     TOTAL HIP ARTHROPLASTY Left 02/11/2018   TOTAL HIP ARTHROPLASTY Left 02/11/2018   Procedure: LEFT TOTAL HIP ARTHROPLASTY ANTERIOR APPROACH;  Surgeon: Kathryne Hitch, MD;  Location: [facility] OR;  Service: Orthopedics;  Laterality: Left;   WISDOM TOOTH EXTRACTION     Social History   Occupational History   Not on file  Tobacco Use   Smoking status: Never   Smokeless tobacco: Never  Vaping Use   Vaping status: Never Used  Substance and Sexual Activity   Alcohol use: No   Drug use: No   Sexual activity: Not on file

## 2023-06-19 ENCOUNTER — Telehealth (HOSPITAL_BASED_OUTPATIENT_CLINIC_OR_DEPARTMENT_OTHER): Payer: Self-pay | Admitting: Orthopaedic Surgery

## 2023-06-19 NOTE — Telephone Encounter (Signed)
FYI The only day she could come in is on the 29th. Her surgery is the 30th. I worked her in

## 2023-06-19 NOTE — Telephone Encounter (Signed)
Hi I  am trying to get this patient scheduled after her MRI on July 21 but Dr Magnus Ivan does not have anything and the patient can not come the week of the July 22. Please advise best contact for patient [phone]

## 2023-06-21 NOTE — Pre-Procedure Instructions (Signed)
Surgical Instructions    Your procedure is scheduled on July 02, 2023.  Report to [facility] Main Entrance "A" at 7:42 A.M., then check in with the Admitting office.  Call this number if you have problems the morning of surgery:  [phone]   If you have any questions prior to your surgery date call [phone]: Open Monday-Friday 8am-4pm If you experience any cold or flu symptoms such as cough, fever, chills, shortness of breath, etc. between now and your scheduled surgery, please notify us at the above number     Remember:  Do not eat after midnight the night before your surgery  You may drink clear liquids until 6:42 the morning of your surgery.   Clear liquids allowed are: Water, Non-Citrus Juices (without pulp), Carbonated Beverages, Clear Tea, Black Coffee ONLY (NO MILK, CREAM OR POWDERED CREAMER of any kind), and Gatorade  Patient Instructions  The night before surgery:  No food after midnight. ONLY clear liquids after midnight   The day of surgery (if you have diabetes): Drink ONE (1) 12 oz G2 given to you in your pre admission testing appointment by 6:42 AM the morning of surgery. Drink in one sitting. Do not sip.  This drink was given to you during your hospital  pre-op appointment visit.  Nothing else to drink after completing the  12 oz bottle of G2.         If you have questions, please contact your surgeon's office.     Take these medicines the morning of surgery with A SIP OF WATER:  DULoxetine (CYMBALTA)  levothyroxine (SYNTHROID, LEVOTHROID)  Budesonide-formoterol (SYMBICORT)  pantoprazole (PROTONIX)   If needed: albuterol (VENTOLIN HFA) 108 (90 Base) MCG/ACT inhaler - Please bring all inhalers with you the day of surgery.  LORazepam (ATIVAN)  methocarbamol (ROBAXIN)  ondansetron (ZOFRAN-ODT)   Follow your surgeon's instructions on when to stop Aspirin.  If no instructions were given by your surgeon then you will need to call the office to get those  instructions.     As of today, STOP taking any Aleve, Naproxen, Ibuprofen, Motrin, Advil, Goody's, BC's, all herbal medications, fish oil, and all vitamins.            [facility] is not responsible for any belongings or valuables.    Do NOT Smoke (Tobacco/Vaping)  24 hours prior to your procedure  If you use a CPAP at night, you may bring your mask for your overnight stay.   Contacts, glasses, hearing aids, dentures or partials may not be worn into surgery, please bring cases for these belongings   For patients admitted to the hospital, discharge time will be determined by your treatment team.   Patients discharged the day of surgery will not be allowed to drive home, and someone needs to stay with them for 24 hours.   SURGICAL WAITING ROOM VISITATION Patients having surgery or a procedure may have no more than 2 support people in the waiting area - these visitors may rotate.   Children under the age of 27 must have an adult with them who is not the patient. If the patient needs to stay at the hospital during part of their recovery, the visitor guidelines for inpatient rooms apply. Pre-op nurse will coordinate an appropriate time for 1 support person to accompany patient in pre-op.  This support person may not rotate.   Please refer to [email or web address] for the visitor guidelines for Inpatients (after your surgery is over and you are in a  regular room).    Special instructions:    Oral Hygiene is also important to reduce your risk of infection.  Remember - BRUSH YOUR TEETH THE MORNING OF SURGERY WITH YOUR REGULAR TOOTHPASTE     Pre-operative 5 CHG Bath Instructions   You can play a key role in reducing the risk of infection after surgery. Your skin needs to be as free of germs as possible. You can reduce the number of germs on your skin by washing with CHG (chlorhexidine gluconate) soap before surgery. CHG is an antiseptic  soap that kills germs and continues to kill germs even after washing.   DO NOT use if you have an allergy to chlorhexidine/CHG or antibacterial soaps. If your skin becomes reddened or irritated, stop using the CHG and notify one of our RNs at [phone].   Please shower with the CHG soap starting 4 days before surgery using the following schedule:     Please keep in mind the following:  DO NOT shave, including legs and underarms, starting the day of your first shower.   You may shave your face at any point before/day of surgery.  Place clean sheets on your bed the day you start using CHG soap. Use a clean washcloth (not used since being washed) for each shower. DO NOT sleep with pets once you start using the CHG.   CHG Shower Instructions:  If you choose to wash your hair and private area, wash first with your normal shampoo/soap.  After you use shampoo/soap, rinse your hair and body thoroughly to remove shampoo/soap residue.  Turn the water OFF and apply about 3 tablespoons (45 ml) of CHG soap to a CLEAN washcloth.  Apply CHG soap ONLY FROM YOUR NECK DOWN TO YOUR TOES (washing for 3-5 minutes)  DO NOT use CHG soap on face, private areas, open wounds, or sores.  Pay special attention to the area where your surgery is being performed.  If you are having back surgery, having someone wash your back for you may be helpful. Wait 2 minutes after CHG soap is applied, then you may rinse off the CHG soap.  Pat dry with a clean towel  Put on clean clothes/pajamas   If you choose to wear lotion, please use ONLY the CHG-compatible lotions on the back of this paper.     Additional instructions for the day of surgery: DO NOT APPLY any lotions, deodorants, cologne, or perfumes.   Put on clean/comfortable clothes.  Brush your teeth.  Ask your nurse before applying any prescription medications to the skin.      CHG Compatible Lotions   Aveeno Moisturizing lotion  Cetaphil Moisturizing  Cream  Cetaphil Moisturizing Lotion  Clairol Herbal Essence Moisturizing Lotion, Dry Skin  Clairol Herbal Essence Moisturizing Lotion, Extra Dry Skin  Clairol Herbal Essence Moisturizing Lotion, Normal Skin  Curel Age Defying Therapeutic Moisturizing Lotion with Alpha Hydroxy  Curel Extreme Care Body Lotion  Curel Soothing Hands Moisturizing Hand Lotion  Curel Therapeutic Moisturizing Cream, Fragrance-Free  Curel Therapeutic Moisturizing Lotion, Fragrance-Free  Curel Therapeutic Moisturizing Lotion, Original Formula  Eucerin Daily Replenishing Lotion  Eucerin Dry Skin Therapy Plus Alpha Hydroxy Crme  Eucerin Dry Skin Therapy Plus Alpha Hydroxy Lotion  Eucerin Original Crme  Eucerin Original Lotion  Eucerin Plus Crme Eucerin Plus Lotion  Eucerin TriLipid Replenishing Lotion  Keri Anti-Bacterial Hand Lotion  Keri Deep Conditioning Original Lotion Dry Skin Formula Softly Scented  Keri Deep Conditioning Original Lotion, Fragrance Free Sensitive Skin Formula  Keri  Lotion Fast Absorbing Fragrance Free Sensitive Skin Formula  Keri Lotion Fast Absorbing Softly Scented Dry Skin Formula  Keri Original Lotion  Keri Skin Renewal Lotion Keri Silky Smooth Lotion  Keri Silky Smooth Sensitive Skin Lotion  Nivea Body Creamy Conditioning Oil  Nivea Body Extra Enriched Teacher, adult education Moisturizing Lotion Nivea Crme  Nivea Skin Firming Lotion  NutraDerm 30 Skin Lotion  NutraDerm Skin Lotion  NutraDerm Therapeutic Skin Cream  NutraDerm Therapeutic Skin Lotion  ProShield Protective Hand Cream  Provon moisturizing lotion    Day of Surgery:  Take a shower with CHG soap. Wear Clean/Comfortable clothing the morning of surgery Do not wear jewelry or makeup. Do not wear lotions, powders, perfumes/cologne or deodorant. Do not shave 48 hours prior to surgery.  Men may shave face and neck. Do not bring valuables to the hospital. Do not wear nail polish, gel  polish, artificial nails, or any other type of covering on natural nails (fingers and toes) If you have artificial nails or gel coating that need to be removed by a nail salon, please have this removed prior to surgery. Artificial nails or gel coating may interfere with anesthesia's ability to adequately monitor your vital signs. Remember to brush your teeth WITH YOUR REGULAR TOOTHPASTE.    If you received a COVID test during your pre-op visit, it is requested that you wear a mask when out in public, stay away from anyone that may not be feeling well, and notify your surgeon if you develop symptoms. If you have been in contact with anyone that has tested positive in the last 10 days, please notify your surgeon.    Please read over the following fact sheets that you were given.

## 2023-06-23 ENCOUNTER — Ambulatory Visit
Admission: RE | Admit: 2023-06-23 | Discharge: 2023-06-23 | Disposition: A | Discharge status: Home / Self Care | Payer: PRIVATE HEALTH INSURANCE | Source: Ambulatory Visit | Attending: Physician Assistant | Admitting: Physician Assistant

## 2023-06-23 DIAGNOSIS — M542 Cervicalgia: Secondary | ICD-10-CM

## 2023-06-24 ENCOUNTER — Encounter (HOSPITAL_COMMUNITY): Payer: Self-pay

## 2023-06-24 ENCOUNTER — Other Ambulatory Visit: Payer: Self-pay

## 2023-06-24 ENCOUNTER — Encounter (HOSPITAL_COMMUNITY)
Admission: RE | Admit: 2023-06-24 | Discharge: 2023-06-24 | Disposition: A | Discharge status: Home / Self Care | Payer: Medicare Other | Source: Ambulatory Visit | Attending: Orthopaedic Surgery | Admitting: Orthopaedic Surgery

## 2023-06-24 VITALS — BP 141/82 | HR 76 | Temp 98.3°F | Resp 18 | Ht 60.0 in | Wt 182.7 lb

## 2023-06-24 DIAGNOSIS — M1611 Unilateral primary osteoarthritis, right hip: Secondary | ICD-10-CM | POA: Insufficient documentation

## 2023-06-24 DIAGNOSIS — Z01812 Encounter for preprocedural laboratory examination: Secondary | ICD-10-CM | POA: Insufficient documentation

## 2023-06-24 DIAGNOSIS — J45991 Cough variant asthma: Secondary | ICD-10-CM | POA: Insufficient documentation

## 2023-06-24 DIAGNOSIS — Z01818 Encounter for other preprocedural examination: Secondary | ICD-10-CM

## 2023-06-24 LAB — CBC
HCT: 43.1 % (ref 36.0–46.0)
Hemoglobin: 13.6 g/dL (ref 12.0–15.0)
MCH: 27.5 pg (ref 26.0–34.0)
MCHC: 31.6 g/dL (ref 30.0–36.0)
MCV: 87.1 fL (ref 80.0–100.0)
Platelets: 283 10*3/uL (ref 150–400)
RBC: 4.95 MIL/uL (ref 3.87–5.11)
RDW: 15 % (ref 11.5–15.5)
WBC: 9.3 10*3/uL (ref 4.0–10.5)
nRBC: 0 % (ref 0.0–0.2)

## 2023-06-24 LAB — COMPREHENSIVE METABOLIC PANEL
ALT: 17 U/L (ref 0–44)
AST: 18 U/L (ref 15–41)
Albumin: 4 g/dL (ref 3.5–5.0)
Alkaline Phosphatase: 82 U/L (ref 38–126)
Anion gap: 9 (ref 5–15)
BUN: 19 mg/dL (ref 8–23)
CO2: 27 mmol/L (ref 22–32)
Calcium: 9.2 mg/dL (ref 8.9–10.3)
Chloride: 103 mmol/L (ref 98–111)
Creatinine, Ser: 0.82 mg/dL (ref 0.44–1.00)
GFR, Estimated: >60 mL/min (ref >60)
Glucose, Bld: 89 mg/dL (ref 70–99)
Potassium: 4.1 mmol/L (ref 3.5–5.1)
Sodium: 139 mmol/L (ref 135–145)
Total Bilirubin: 0.6 mg/dL (ref 0.3–1.2)
Total Protein: 6.9 g/dL (ref 6.5–8.1)

## 2023-06-24 LAB — TYPE AND SCREEN
ABO/RH(D): B POS
Antibody Screen: NEGATIVE

## 2023-06-24 LAB — SURGICAL PCR SCREEN
MRSA, PCR: NEGATIVE
Staphylococcus aureus: POSITIVE — AB

## 2023-06-24 NOTE — Pre-Procedure Instructions (Signed)
Surgical Instructions    Your procedure is scheduled on July 02, 2023.  Report to [facility] Main Entrance "A" at 7:42 A.M., then check in with the Admitting office.  Call this number if you have problems the morning of surgery:  [phone]   If you have any questions prior to your surgery date call [phone]: Open Monday-Friday 8am-4pm If you experience any cold or flu symptoms such as cough, fever, chills, shortness of breath, etc. between now and your scheduled surgery, please notify us at the above number     Remember:  Do not eat after midnight the night before your surgery  You may drink clear liquids until 6:40 the morning of your surgery.   Clear liquids allowed are: Water, Non-Citrus Juices (without pulp), Carbonated Beverages, Clear Tea, Black Coffee ONLY (NO MILK, CREAM OR POWDERED CREAMER of any kind), and Gatorade  Patient Instructions  The night before surgery:  No food after midnight. ONLY clear liquids after midnight   Patient Instructions  The night before surgery:  No food after midnight. ONLY clear liquids after midnight  The day of surgery (if you do NOT have diabetes):  Drink ONE (1) Pre-Surgery Clear Ensure by 6:40 the morning of surgery. Drink in one sitting. Do not sip.  This drink was given to you during your hospital  pre-op appointment visit.  Nothing else to drink after completing the  Pre-Surgery Clear Ensure.          If you have questions, please contact your surgeon's office.      Take these medicines the morning of surgery with A SIP OF WATER:  DULoxetine (CYMBALTA)  levothyroxine (SYNTHROID, LEVOTHROID)  Budesonide-formoterol (SYMBICORT)  pantoprazole (PROTONIX)   If needed: albuterol (VENTOLIN HFA) 108 (90 Base) MCG/ACT inhaler - Please bring all inhalers with you the day of surgery.  LORazepam (ATIVAN)  methocarbamol (ROBAXIN)  ondansetron (ZOFRAN-ODT)   Follow your surgeon's instructions on when to stop Aspirin.  If no  instructions were given by your surgeon then you will need to call the office to get those instructions.     As of today, STOP taking any Aleve, Naproxen, Ibuprofen, Motrin, Advil, Goody's, BC's, all herbal medications, fish oil, and all vitamins.            [facility] is not responsible for any belongings or valuables.    Do NOT Smoke (Tobacco/Vaping)  24 hours prior to your procedure  If you use a CPAP at night, you may bring your mask for your overnight stay.   Contacts, glasses, hearing aids, dentures or partials may not be worn into surgery, please bring cases for these belongings   For patients admitted to the hospital, discharge time will be determined by your treatment team.   Patients discharged the day of surgery will not be allowed to drive home, and someone needs to stay with them for 24 hours.   SURGICAL WAITING ROOM VISITATION Patients having surgery or a procedure may have no more than 2 support people in the waiting area - these visitors may rotate.   Children under the age of 66 must have an adult with them who is not the patient. If the patient needs to stay at the hospital during part of their recovery, the visitor guidelines for inpatient rooms apply. Pre-op nurse will coordinate an appropriate time for 1 support person to accompany patient in pre-op.  This support person may not rotate.   Please refer to [email or web address] for the visitor guidelines for  Inpatients (after your surgery is over and you are in a regular room).    Special instructions:    Oral Hygiene is also important to reduce your risk of infection.  Remember - BRUSH YOUR TEETH THE MORNING OF SURGERY WITH YOUR REGULAR TOOTHPASTE     Pre-operative 5 CHG Bath Instructions   You can play a key role in reducing the risk of infection after surgery. Your skin needs to be as free of germs as possible. You can reduce the number of germs on your skin  by washing with CHG (chlorhexidine gluconate) soap before surgery. CHG is an antiseptic soap that kills germs and continues to kill germs even after washing.   DO NOT use if you have an allergy to chlorhexidine/CHG or antibacterial soaps. If your skin becomes reddened or irritated, stop using the CHG and notify one of our RNs at [phone].   Please shower with the CHG soap starting 4 days before surgery using the following schedule:     Please keep in mind the following:  DO NOT shave, including legs and underarms, starting the day of your first shower.   You may shave your face at any point before/day of surgery.  Place clean sheets on your bed the day you start using CHG soap. Use a clean washcloth (not used since being washed) for each shower. DO NOT sleep with pets once you start using the CHG.   CHG Shower Instructions:  If you choose to wash your hair and private area, wash first with your normal shampoo/soap.  After you use shampoo/soap, rinse your hair and body thoroughly to remove shampoo/soap residue.  Turn the water OFF and apply about 3 tablespoons (45 ml) of CHG soap to a CLEAN washcloth.  Apply CHG soap ONLY FROM YOUR NECK DOWN TO YOUR TOES (washing for 3-5 minutes)  DO NOT use CHG soap on face, private areas, open wounds, or sores.  Pay special attention to the area where your surgery is being performed.  If you are having back surgery, having someone wash your back for you may be helpful. Wait 2 minutes after CHG soap is applied, then you may rinse off the CHG soap.  Pat dry with a clean towel  Put on clean clothes/pajamas   If you choose to wear lotion, please use ONLY the CHG-compatible lotions on the back of this paper.     Additional instructions for the day of surgery: DO NOT APPLY any lotions, deodorants, cologne, or perfumes.   Put on clean/comfortable clothes.  Brush your teeth.  Ask your nurse before applying any prescription medications to the  skin.      CHG Compatible Lotions   Aveeno Moisturizing lotion  Cetaphil Moisturizing Cream  Cetaphil Moisturizing Lotion  Clairol Herbal Essence Moisturizing Lotion, Dry Skin  Clairol Herbal Essence Moisturizing Lotion, Extra Dry Skin  Clairol Herbal Essence Moisturizing Lotion, Normal Skin  Curel Age Defying Therapeutic Moisturizing Lotion with Alpha Hydroxy  Curel Extreme Care Body Lotion  Curel Soothing Hands Moisturizing Hand Lotion  Curel Therapeutic Moisturizing Cream, Fragrance-Free  Curel Therapeutic Moisturizing Lotion, Fragrance-Free  Curel Therapeutic Moisturizing Lotion, Original Formula  Eucerin Daily Replenishing Lotion  Eucerin Dry Skin Therapy Plus Alpha Hydroxy Crme  Eucerin Dry Skin Therapy Plus Alpha Hydroxy Lotion  Eucerin Original Crme  Eucerin Original Lotion  Eucerin Plus Crme Eucerin Plus Lotion  Eucerin TriLipid Replenishing Lotion  Keri Anti-Bacterial Hand Lotion  Keri Deep Conditioning Original Lotion Dry Skin Formula Softly Scented  Keri  Deep Conditioning Original Lotion, Fragrance Free Sensitive Skin Formula  Keri Lotion Fast Absorbing Fragrance Free Sensitive Skin Formula  Keri Lotion Fast Absorbing Softly Scented Dry Skin Formula  Keri Original Lotion  Keri Skin Renewal Lotion Keri Silky Smooth Lotion  Keri Silky Smooth Sensitive Skin Lotion  Nivea Body Creamy Conditioning Oil  Nivea Body Extra Enriched Teacher, adult education Moisturizing Lotion Nivea Crme  Nivea Skin Firming Lotion  NutraDerm 30 Skin Lotion  NutraDerm Skin Lotion  NutraDerm Therapeutic Skin Cream  NutraDerm Therapeutic Skin Lotion  ProShield Protective Hand Cream  Provon moisturizing lotion    Day of Surgery:  Take a shower with CHG soap. Wear Clean/Comfortable clothing the morning of surgery Do not wear jewelry or makeup. Do not wear lotions, powders, perfumes/cologne or deodorant. Do not shave 48 hours prior to surgery.  Men  may shave face and neck. Do not bring valuables to the hospital. Do not wear nail polish, gel polish, artificial nails, or any other type of covering on natural nails (fingers and toes) If you have artificial nails or gel coating that need to be removed by a nail salon, please have this removed prior to surgery. Artificial nails or gel coating may interfere with anesthesia's ability to adequately monitor your vital signs. Remember to brush your teeth WITH YOUR REGULAR TOOTHPASTE.    If you received a COVID test during your pre-op visit, it is requested that you wear a mask when out in public, stay away from anyone that may not be feeling well, and notify your surgeon if you develop symptoms. If you have been in contact with anyone that has tested positive in the last 10 days, please notify your surgeon.    Please read over the following fact sheets that you were given.

## 2023-06-24 NOTE — Progress Notes (Signed)
PCP - Mady Gemma, PA-C Cardiologist - denies  PPM/ICD - denies  Chest x-ray - n/a 07/04/2022 EKG -  Stress Test - 10/12/2009  ECHO -  Cardiac Cath -  12/07/2009  Sleep Study - denies CPAP - denies  Non-diabetic  Blood Thinner Instructions:denies Aspirin Instructions:states last dose to be 06/28/2023  ERAS Protcol -Yes,  PRE-SURGERY Ensure  COVID TEST- n/a   Anesthesia review: No  Patient denies shortness of breath, fever, cough and chest pain at PAT appointment   All instructions explained to the patient, with a verbal understanding of the material. Patient agrees to go over the instructions while at home for a better understanding. Patient also instructed to self quarantine after being tested for COVID-19. The opportunity to ask questions was provided.

## 2023-06-27 ENCOUNTER — Telehealth: Payer: Self-pay | Admitting: Orthopaedic Surgery

## 2023-06-27 NOTE — Telephone Encounter (Signed)
Patient called wanted you to look at her neck MRI and discuss it. She has a appointment on Monday  but just wanted you to make sure she  doesn't have anything else to do.CB#[phone]. She said leave a message she is with patients all day.

## 2023-07-01 ENCOUNTER — Ambulatory Visit: Payer: PRIVATE HEALTH INSURANCE | Admitting: Orthopaedic Surgery

## 2023-07-01 DIAGNOSIS — M1611 Unilateral primary osteoarthritis, right hip: Secondary | ICD-10-CM | POA: Insufficient documentation

## 2023-07-01 NOTE — H&P (Signed)
TOTAL HIP ADMISSION H&P  Patient is admitted for right total hip arthroplasty.  Subjective:  Chief Complaint: right hip pain  HPI: Ellen Sawyer, 67 y.o. female, has a history of pain and functional disability in the right hip(s) due to arthritis and patient has failed non-surgical conservative treatments for greater than 12 weeks to include NSAID's and/or analgesics, corticosteriod injections, viscosupplementation injections, flexibility and strengthening excercises, use of assistive devices, and activity modification.  Onset of symptoms was gradual starting 2 years ago with gradually worsening course since that time.The patient noted no past surgery on the right hip(s).  Patient currently rates pain in the right hip at 10 out of 10 with activity. Patient has night pain, worsening of pain with activity and weight bearing, pain that interfers with activities of daily living, and pain with passive range of motion. Patient has evidence of subchondral sclerosis, periarticular osteophytes, and joint space narrowing by imaging studies. This condition presents safety issues increasing the risk of falls.  There is no current active infection.  Patient Active Problem List   Diagnosis Date Noted   Unilateral primary osteoarthritis, right hip 07/01/2023   DOE (dyspnea on exertion) 09/08/2021   Cough variant asthma 09/08/2021   Status post total replacement of left hip 02/11/2018   Spondylolisthesis at L4-L5 level 09/09/2017   Past Medical History:  Diagnosis Date   Anxiety    DULoxetine (CYMBALTA)   Asthma    Chest discomfort    in the past   Chest pain    in the past   History of hiatal hernia    Hypercholesterolemia    Hypertriglyceridemia    Osteoarthritis of left hip    PONV (postoperative nausea and vomiting)    Pre-diabetes    year ago was on metformin, then she has been of metforming for many years    Past Surgical History:  Procedure Laterality Date   CARDIAC CATHETERIZATION      2008   cataract Bilateral    CESAREAN SECTION  1986 and 1989   COLONOSCOPY     feet surgery  1990   four   herniated disc  1990 and 1992   two   LUMBAR LAMINECTOMY     rods screws, cadiver bone    TONSILLECTOMY     TOOTH EXTRACTION     TOTAL HIP ARTHROPLASTY Left 02/11/2018   TOTAL HIP ARTHROPLASTY Left 02/11/2018   Procedure: LEFT TOTAL HIP ARTHROPLASTY ANTERIOR APPROACH;  Surgeon: Kathryne Hitch, MD;  Location: [facility] OR;  Service: Orthopedics;  Laterality: Left;   WISDOM TOOTH EXTRACTION      No current facility-administered medications for this encounter.   Current Outpatient Medications  Medication Sig Dispense Refill Last Dose   albuterol (VENTOLIN HFA) 108 (90 Base) MCG/ACT inhaler Inhale 1 puff into the lungs every 6 (six) hours as needed for shortness of breath.      aspirin 81 MG EC tablet TAKE 1 TABLET BY MOUTH EVERY DAY 60 tablet 0    b complex vitamins tablet Take 1 tablet by mouth daily.      budesonide-formoterol (SYMBICORT) 80-4.5 MCG/ACT inhaler Take 2 puffs first thing in am and then another 2 puffs about 12 hours later. 1 each 12    DULoxetine (CYMBALTA) 30 MG capsule Take 30 mg by mouth daily.      DULoxetine (CYMBALTA) 60 MG capsule Take 60 mg by mouth daily.      gabapentin (NEURONTIN) 300 MG capsule Take 1 capsule (300 mg total) by  mouth at bedtime. 30 capsule 1    levothyroxine (SYNTHROID, LEVOTHROID) 50 MCG tablet Take 50 mcg by mouth daily before breakfast.       LORazepam (ATIVAN) 0.5 MG tablet Take 0.5 mg by mouth 2 (two) times daily as needed for anxiety.      methocarbamol (ROBAXIN) 500 MG tablet Take 1 tablet (500 mg total) by mouth every 8 (eight) hours as needed for muscle spasms. 60 tablet 1    ondansetron (ZOFRAN-ODT) 4 MG disintegrating tablet 4mg  ODT q4 hours prn nausea/vomit 10 tablet 0    pantoprazole (PROTONIX) 40 MG tablet Take 40 mg by mouth daily.      Vitamin D, Ergocalciferol, (DRISDOL) 50000 units CAPS capsule Take 50,000 Units  by mouth every 7 (seven) days. Saturdays      Allergies  Allergen Reactions   Ceftin [Cefuroxime Axetil] Anaphylaxis and Other (See Comments)    Other   Cinobac [Cinoxacin] Anaphylaxis    Social History   Tobacco Use   Smoking status: Never   Smokeless tobacco: Never  Substance Use Topics   Alcohol use: No    Family History  Problem Relation Age of Onset   Coronary artery disease Father        68, several bypass   Dementia Father    Pancreatic cancer Mother      Review of Systems  Objective:  Physical Exam Vitals reviewed.  Constitutional:      Appearance: Normal appearance. She is normal weight.  HENT:     Head: Normocephalic and atraumatic.  Eyes:     Extraocular Movements: Extraocular movements intact.     Pupils: Pupils are equal, round, and reactive to light.  Cardiovascular:     Rate and Rhythm: Normal rate and regular rhythm.     Pulses: Normal pulses.  Pulmonary:     Effort: Pulmonary effort is normal.     Breath sounds: Normal breath sounds.  Abdominal:     Palpations: Abdomen is soft.  Musculoskeletal:     Cervical back: Normal range of motion.     Right hip: Tenderness and bony tenderness present. Decreased range of motion. Decreased strength.  Neurological:     Mental Status: She is alert and oriented to person, place, and time.  Psychiatric:        Behavior: Behavior normal.     Vital signs in last 24 hours:    Labs:   Estimated body mass index is 35.68 kg/m as calculated from the following:   Height as of 06/24/23: 5' (1.524 m).   Weight as of 06/24/23: 82.9 kg.   Imaging Review Plain radiographs demonstrate severe degenerative joint disease of the right hip(s). The bone quality appears to be good for age and reported activity level.      Assessment/Plan:  End stage arthritis, right hip(s)  The patient history, physical examination, clinical judgement of the provider and imaging studies are consistent with end stage  degenerative joint disease of the right hip(s) and total hip arthroplasty is deemed medically necessary. The treatment options including medical management, injection therapy, arthroscopy and arthroplasty were discussed at length. The risks and benefits of total hip arthroplasty were presented and reviewed. The risks due to aseptic loosening, infection, stiffness, dislocation/subluxation,  thromboembolic complications and other imponderables were discussed.  The patient acknowledged the explanation, agreed to proceed with the plan and consent was signed. Patient is being admitted for inpatient treatment for surgery, pain control, PT, OT, prophylactic antibiotics, VTE prophylaxis, progressive ambulation and ADL's  and discharge planning.The patient is planning to be discharged home with home health services

## 2023-07-02 ENCOUNTER — Other Ambulatory Visit: Payer: Self-pay

## 2023-07-02 ENCOUNTER — Observation Stay (HOSPITAL_COMMUNITY): Payer: Medicare Other

## 2023-07-02 ENCOUNTER — Ambulatory Visit (HOSPITAL_BASED_OUTPATIENT_CLINIC_OR_DEPARTMENT_OTHER): Payer: Medicare Other | Admitting: Vascular Surgery

## 2023-07-02 ENCOUNTER — Ambulatory Visit (HOSPITAL_COMMUNITY): Payer: Medicare Other

## 2023-07-02 ENCOUNTER — Encounter (HOSPITAL_COMMUNITY)
Admission: RE | Disposition: A | Discharge status: Home Health | Payer: Self-pay | Source: Home / Self Care | Attending: Orthopaedic Surgery

## 2023-07-02 ENCOUNTER — Observation Stay (HOSPITAL_COMMUNITY)
Admission: RE | Admit: 2023-07-02 | Discharge: 2023-07-03 | Disposition: A | Discharge status: Home Health | Payer: Medicare Other | Attending: Orthopaedic Surgery | Admitting: Orthopaedic Surgery

## 2023-07-02 ENCOUNTER — Encounter (HOSPITAL_COMMUNITY): Payer: Self-pay | Admitting: Orthopaedic Surgery

## 2023-07-02 ENCOUNTER — Ambulatory Visit (HOSPITAL_COMMUNITY): Payer: Medicare Other | Admitting: Vascular Surgery

## 2023-07-02 DIAGNOSIS — J45909 Unspecified asthma, uncomplicated: Secondary | ICD-10-CM | POA: Diagnosis not present

## 2023-07-02 DIAGNOSIS — M169 Osteoarthritis of hip, unspecified: Secondary | ICD-10-CM | POA: Diagnosis present

## 2023-07-02 DIAGNOSIS — M1611 Unilateral primary osteoarthritis, right hip: Principal | ICD-10-CM | POA: Insufficient documentation

## 2023-07-02 DIAGNOSIS — Z7982 Long term (current) use of aspirin: Secondary | ICD-10-CM | POA: Insufficient documentation

## 2023-07-02 DIAGNOSIS — Z96642 Presence of left artificial hip joint: Secondary | ICD-10-CM | POA: Diagnosis not present

## 2023-07-02 DIAGNOSIS — Z79899 Other long term (current) drug therapy: Secondary | ICD-10-CM | POA: Diagnosis not present

## 2023-07-02 DIAGNOSIS — Z96641 Presence of right artificial hip joint: Secondary | ICD-10-CM

## 2023-07-02 HISTORY — PX: TOTAL HIP ARTHROPLASTY: SHX124

## 2023-07-02 SURGERY — ARTHROPLASTY, HIP, TOTAL, ANTERIOR APPROACH
Anesthesia: Spinal | Site: Hip | Laterality: Right

## 2023-07-02 MED ORDER — OXYCODONE HCL 5 MG PO TABS
10.0000 mg | ORAL_TABLET | ORAL | Status: DC | PRN
  Filled 2023-07-02: qty 2

## 2023-07-02 MED ORDER — ACETAMINOPHEN 500 MG PO TABS
1000.0000 mg | ORAL_TABLET | Freq: Once | ORAL | Status: AC
  Administered 2023-07-02: 1000 mg via ORAL
  Filled 2023-07-02: qty 2

## 2023-07-02 MED ORDER — ACETAMINOPHEN 325 MG PO TABS
325.0000 mg | ORAL_TABLET | Freq: Four times a day (QID) | ORAL | Status: DC | PRN
  Administered 2023-07-02 – 2023-07-03 (×2): 650 mg via ORAL
  Filled 2023-07-02 (×2): qty 2

## 2023-07-02 MED ORDER — PROPOFOL 1000 MG/100ML IV EMUL
INTRAVENOUS | Status: AC
  Filled 2023-07-02: qty 200

## 2023-07-02 MED ORDER — MOMETASONE FURO-FORMOTEROL FUM 100-5 MCG/ACT IN AERO
2.0000 | INHALATION_SPRAY | Freq: Two times a day (BID) | RESPIRATORY_TRACT | Status: DC
  Administered 2023-07-02 – 2023-07-03 (×2): 2 via RESPIRATORY_TRACT
  Filled 2023-07-02 (×2): qty 8.8

## 2023-07-02 MED ORDER — DIPHENHYDRAMINE HCL 12.5 MG/5ML PO ELIX: 12.5000 mg | ORAL_SOLUTION | ORAL | Status: DC | PRN

## 2023-07-02 MED ORDER — DEXAMETHASONE SODIUM PHOSPHATE 10 MG/ML IJ SOLN
INTRAMUSCULAR | Status: DC | PRN
  Administered 2023-07-02: 10 mg via INTRAVENOUS

## 2023-07-02 MED ORDER — MIDAZOLAM HCL 2 MG/2ML IJ SOLN
INTRAMUSCULAR | Status: DC | PRN
  Administered 2023-07-02: 2 mg via INTRAVENOUS

## 2023-07-02 MED ORDER — ONDANSETRON HCL 4 MG/2ML IJ SOLN: 4.0000 mg | Freq: Four times a day (QID) | INTRAMUSCULAR | Status: DC | PRN

## 2023-07-02 MED ORDER — OXYCODONE HCL 5 MG PO TABS: 5.0000 mg | ORAL_TABLET | Freq: Once | ORAL | Status: DC | PRN

## 2023-07-02 MED ORDER — PHENYLEPHRINE 80 MCG/ML (10ML) SYRINGE FOR IV PUSH (FOR BLOOD PRESSURE SUPPORT)
PREFILLED_SYRINGE | INTRAVENOUS | Status: AC
  Filled 2023-07-02: qty 10

## 2023-07-02 MED ORDER — ALUM & MAG HYDROXIDE-SIMETH 200-200-20 MG/5ML PO SUSP: 30.0000 mL | ORAL | Status: DC | PRN

## 2023-07-02 MED ORDER — CHLORHEXIDINE GLUCONATE 0.12 % MT SOLN
15.0000 mL | Freq: Once | OROMUCOSAL | Status: AC
  Administered 2023-07-02: 15 mL via OROMUCOSAL
  Filled 2023-07-02: qty 15

## 2023-07-02 MED ORDER — DULOXETINE HCL 60 MG PO CPEP
60.0000 mg | ORAL_CAPSULE | Freq: Every day | ORAL | Status: DC
  Administered 2023-07-02 – 2023-07-03 (×2): 60 mg via ORAL
  Filled 2023-07-02 (×2): qty 1

## 2023-07-02 MED ORDER — FENTANYL CITRATE (PF) 100 MCG/2ML IJ SOLN: 25.0000 ug | INTRAMUSCULAR | Status: DC | PRN

## 2023-07-02 MED ORDER — LIDOCAINE 2% (20 MG/ML) 5 ML SYRINGE
INTRAMUSCULAR | Status: DC | PRN
  Administered 2023-07-02: 40 mg via INTRAVENOUS

## 2023-07-02 MED ORDER — ONDANSETRON HCL 4 MG PO TABS: 4.0000 mg | ORAL_TABLET | Freq: Four times a day (QID) | ORAL | Status: DC | PRN

## 2023-07-02 MED ORDER — POVIDONE-IODINE 10 % EX SWAB
2.0000 | Freq: Once | CUTANEOUS | Status: AC
  Administered 2023-07-02: 2 via TOPICAL

## 2023-07-02 MED ORDER — PROPOFOL 1000 MG/100ML IV EMUL
INTRAVENOUS | Status: AC
  Filled 2023-07-02: qty 100

## 2023-07-02 MED ORDER — DOCUSATE SODIUM 100 MG PO CAPS
100.0000 mg | ORAL_CAPSULE | Freq: Two times a day (BID) | ORAL | Status: DC
  Administered 2023-07-02 – 2023-07-03 (×2): 100 mg via ORAL
  Filled 2023-07-02 (×2): qty 1

## 2023-07-02 MED ORDER — MIDAZOLAM HCL 2 MG/2ML IJ SOLN
INTRAMUSCULAR | Status: AC
  Filled 2023-07-02: qty 2

## 2023-07-02 MED ORDER — VANCOMYCIN HCL IN DEXTROSE 1-5 GM/200ML-% IV SOLN
1000.0000 mg | Freq: Two times a day (BID) | INTRAVENOUS | Status: AC
  Administered 2023-07-02: 1000 mg via INTRAVENOUS
  Filled 2023-07-02: qty 200

## 2023-07-02 MED ORDER — DEXAMETHASONE SODIUM PHOSPHATE 10 MG/ML IJ SOLN
INTRAMUSCULAR | Status: AC
  Filled 2023-07-02: qty 1

## 2023-07-02 MED ORDER — POLYETHYLENE GLYCOL 3350 17 G PO PACK: 17.0000 g | PACK | Freq: Every day | ORAL | Status: DC | PRN

## 2023-07-02 MED ORDER — EPHEDRINE 5 MG/ML INJ
INTRAVENOUS | Status: AC
  Filled 2023-07-02: qty 5

## 2023-07-02 MED ORDER — OXYCODONE HCL 5 MG/5ML PO SOLN: 5.0000 mg | Freq: Once | ORAL | Status: DC | PRN

## 2023-07-02 MED ORDER — LACTATED RINGERS IV SOLN: INTRAVENOUS | Status: DC

## 2023-07-02 MED ORDER — METHOCARBAMOL 1000 MG/10ML IJ SOLN: 500.0000 mg | Freq: Four times a day (QID) | INTRAVENOUS | Status: DC | PRN

## 2023-07-02 MED ORDER — ASPIRIN 81 MG PO CHEW
81.0000 mg | CHEWABLE_TABLET | Freq: Two times a day (BID) | ORAL | Status: DC
  Administered 2023-07-02 – 2023-07-03 (×2): 81 mg via ORAL
  Filled 2023-07-02 (×2): qty 1

## 2023-07-02 MED ORDER — METHOCARBAMOL 500 MG PO TABS
500.0000 mg | ORAL_TABLET | Freq: Four times a day (QID) | ORAL | Status: DC | PRN
  Administered 2023-07-02 – 2023-07-03 (×2): 500 mg via ORAL
  Filled 2023-07-02 (×2): qty 1

## 2023-07-02 MED ORDER — DULOXETINE HCL 30 MG PO CPEP
30.0000 mg | ORAL_CAPSULE | Freq: Every day | ORAL | Status: DC
  Administered 2023-07-02 – 2023-07-03 (×2): 30 mg via ORAL
  Filled 2023-07-02 (×2): qty 1

## 2023-07-02 MED ORDER — VASOPRESSIN 20 UNIT/ML IV SOLN
INTRAVENOUS | Status: AC
  Filled 2023-07-02: qty 1

## 2023-07-02 MED ORDER — PROMETHAZINE HCL 25 MG/ML IJ SOLN: 6.2500 mg | INTRAMUSCULAR | Status: DC | PRN

## 2023-07-02 MED ORDER — PANTOPRAZOLE SODIUM 40 MG PO TBEC
40.0000 mg | DELAYED_RELEASE_TABLET | Freq: Every day | ORAL | Status: DC
  Administered 2023-07-02 – 2023-07-03 (×2): 40 mg via ORAL
  Filled 2023-07-02 (×2): qty 1

## 2023-07-02 MED ORDER — METOCLOPRAMIDE HCL 5 MG PO TABS: 5.0000 mg | ORAL_TABLET | Freq: Three times a day (TID) | ORAL | Status: DC | PRN

## 2023-07-02 MED ORDER — SODIUM CHLORIDE 0.9 % IV SOLN: INTRAVENOUS | Status: DC

## 2023-07-02 MED ORDER — FENTANYL CITRATE (PF) 250 MCG/5ML IJ SOLN
INTRAMUSCULAR | Status: AC
  Filled 2023-07-02: qty 5

## 2023-07-02 MED ORDER — GABAPENTIN 300 MG PO CAPS
300.0000 mg | ORAL_CAPSULE | Freq: Every day | ORAL | Status: DC
  Administered 2023-07-02: 300 mg via ORAL
  Filled 2023-07-02: qty 1

## 2023-07-02 MED ORDER — HYDROMORPHONE HCL 1 MG/ML IJ SOLN: 0.5000 mg | INTRAMUSCULAR | Status: DC | PRN

## 2023-07-02 MED ORDER — ALBUTEROL SULFATE (2.5 MG/3ML) 0.083% IN NEBU: 3.0000 mL | INHALATION_SOLUTION | Freq: Four times a day (QID) | RESPIRATORY_TRACT | Status: DC | PRN

## 2023-07-02 MED ORDER — ONDANSETRON HCL 4 MG/2ML IJ SOLN
INTRAMUSCULAR | Status: AC
  Filled 2023-07-02: qty 2

## 2023-07-02 MED ORDER — ORAL CARE MOUTH RINSE: 15.0000 mL | Freq: Once | OROMUCOSAL | Status: AC

## 2023-07-02 MED ORDER — 0.9 % SODIUM CHLORIDE (POUR BTL) OPTIME
TOPICAL | Status: DC | PRN
  Administered 2023-07-02: 1000 mL

## 2023-07-02 MED ORDER — PROPOFOL 500 MG/50ML IV EMUL
INTRAVENOUS | Status: DC | PRN
  Administered 2023-07-02: 100 ug/kg/min via INTRAVENOUS

## 2023-07-02 MED ORDER — SUCCINYLCHOLINE CHLORIDE 200 MG/10ML IV SOSY
PREFILLED_SYRINGE | INTRAVENOUS | Status: AC
  Filled 2023-07-02: qty 10

## 2023-07-02 MED ORDER — LEVOTHYROXINE SODIUM 50 MCG PO TABS
50.0000 ug | ORAL_TABLET | Freq: Every day | ORAL | Status: DC
  Administered 2023-07-03: 50 ug via ORAL
  Filled 2023-07-02: qty 1

## 2023-07-02 MED ORDER — SODIUM CHLORIDE 0.9 % IR SOLN
Status: DC | PRN
  Administered 2023-07-02: 1000 mL

## 2023-07-02 MED ORDER — METOCLOPRAMIDE HCL 5 MG/ML IJ SOLN: 5.0000 mg | Freq: Three times a day (TID) | INTRAMUSCULAR | Status: DC | PRN

## 2023-07-02 MED ORDER — PROPOFOL 10 MG/ML IV BOLUS
INTRAVENOUS | Status: AC
  Filled 2023-07-02: qty 20

## 2023-07-02 MED ORDER — PHENYLEPHRINE HCL-NACL 20-0.9 MG/250ML-% IV SOLN
INTRAVENOUS | Status: AC
  Filled 2023-07-02: qty 750

## 2023-07-02 MED ORDER — BUPIVACAINE IN DEXTROSE 0.75-8.25 % IT SOLN
INTRATHECAL | Status: DC | PRN
  Administered 2023-07-02: 1.8 mL via INTRATHECAL

## 2023-07-02 MED ORDER — AMISULPRIDE (ANTIEMETIC) 5 MG/2ML IV SOLN: 10.0000 mg | Freq: Once | INTRAVENOUS | Status: DC | PRN

## 2023-07-02 MED ORDER — PHENYLEPHRINE 80 MCG/ML (10ML) SYRINGE FOR IV PUSH (FOR BLOOD PRESSURE SUPPORT)
PREFILLED_SYRINGE | INTRAVENOUS | Status: DC | PRN
  Administered 2023-07-02: 160 ug via INTRAVENOUS
  Administered 2023-07-02 (×2): 80 ug via INTRAVENOUS

## 2023-07-02 MED ORDER — TRANEXAMIC ACID-NACL 1000-0.7 MG/100ML-% IV SOLN
1000.0000 mg | INTRAVENOUS | Status: AC
  Administered 2023-07-02: 1000 mg via INTRAVENOUS
  Filled 2023-07-02: qty 100

## 2023-07-02 MED ORDER — MENTHOL 3 MG MT LOZG: 1.0000 | LOZENGE | OROMUCOSAL | Status: DC | PRN

## 2023-07-02 MED ORDER — FENTANYL CITRATE (PF) 250 MCG/5ML IJ SOLN
INTRAMUSCULAR | Status: DC | PRN
  Administered 2023-07-02: 75 ug via INTRAVENOUS
  Administered 2023-07-02: 50 ug via INTRAVENOUS
  Administered 2023-07-02: 25 ug via INTRAVENOUS

## 2023-07-02 MED ORDER — ONDANSETRON HCL 4 MG/2ML IJ SOLN
INTRAMUSCULAR | Status: DC | PRN
  Administered 2023-07-02: 4 mg via INTRAVENOUS

## 2023-07-02 MED ORDER — OXYCODONE HCL 5 MG PO TABS
5.0000 mg | ORAL_TABLET | ORAL | Status: DC | PRN
  Administered 2023-07-02: 10 mg via ORAL
  Administered 2023-07-02: 5 mg via ORAL
  Administered 2023-07-02 – 2023-07-03 (×2): 10 mg via ORAL
  Filled 2023-07-02 (×3): qty 2

## 2023-07-02 MED ORDER — VANCOMYCIN HCL IN DEXTROSE 1-5 GM/200ML-% IV SOLN
1000.0000 mg | Freq: Once | INTRAVENOUS | Status: AC
  Administered 2023-07-02: 1000 mg via INTRAVENOUS
  Filled 2023-07-02: qty 200

## 2023-07-02 MED ORDER — PHENOL 1.4 % MT LIQD: 1.0000 | OROMUCOSAL | Status: DC | PRN

## 2023-07-02 MED ORDER — LIDOCAINE 2% (20 MG/ML) 5 ML SYRINGE
INTRAMUSCULAR | Status: AC
  Filled 2023-07-02: qty 5

## 2023-07-02 MED ORDER — PHENYLEPHRINE HCL-NACL 20-0.9 MG/250ML-% IV SOLN
INTRAVENOUS | Status: DC | PRN
  Administered 2023-07-02: 50 ug/min via INTRAVENOUS

## 2023-07-02 MED ORDER — LORAZEPAM 0.5 MG PO TABS
0.5000 mg | ORAL_TABLET | Freq: Two times a day (BID) | ORAL | Status: DC | PRN
  Filled 2023-07-02: qty 1

## 2023-07-02 SURGICAL SUPPLY — 55 items
APL SKNCLS STERI-STRIP NONHPOA (GAUZE/BANDAGES/DRESSINGS)
BAG COUNTER SPONGE SURGICOUNT (BAG) ×1 IMPLANT
BAG SPNG CNTER NS LX DISP (BAG) ×1
BENZOIN TINCTURE PRP APPL 2/3 (GAUZE/BANDAGES/DRESSINGS) ×1 IMPLANT
BLADE CLIPPER SURG (BLADE) IMPLANT
BLADE SAW SGTL 18X1.27X75 (BLADE) ×1 IMPLANT
COVER SURGICAL LIGHT HANDLE (MISCELLANEOUS) ×1 IMPLANT
CUP SECTOR GRIPTON 50MM (Cup) IMPLANT
DRAPE C-ARM 42X72 X-RAY (DRAPES) ×1 IMPLANT
DRAPE STERI IOBAN 125X83 (DRAPES) ×1 IMPLANT
DRAPE U-SHAPE 47X51 STRL (DRAPES) ×3 IMPLANT
DRSG AQUACEL AG ADV 3.5X10 (GAUZE/BANDAGES/DRESSINGS) ×1 IMPLANT
DURAPREP 26ML APPLICATOR (WOUND CARE) ×1 IMPLANT
ELECT BLADE 4.0 EZ CLEAN MEGAD (MISCELLANEOUS) ×1
ELECT BLADE 6.5 EXT (BLADE) IMPLANT
ELECT REM PT RETURN 9FT ADLT (ELECTROSURGICAL) ×1
ELECTRODE BLDE 4.0 EZ CLN MEGD (MISCELLANEOUS) ×1 IMPLANT
ELECTRODE REM PT RTRN 9FT ADLT (ELECTROSURGICAL) ×1 IMPLANT
FACESHIELD WRAPAROUND (MASK) ×2 IMPLANT
FACESHIELD WRAPAROUND OR TEAM (MASK) ×2 IMPLANT
GLOVE BIOGEL PI IND STRL 8 (GLOVE) ×2 IMPLANT
GLOVE ECLIPSE 8.0 STRL XLNG CF (GLOVE) ×1 IMPLANT
GLOVE ORTHO TXT STRL SZ7.5 (GLOVE) ×2 IMPLANT
GOWN STRL REUS W/ TWL LRG LVL3 (GOWN DISPOSABLE) ×2 IMPLANT
GOWN STRL REUS W/ TWL XL LVL3 (GOWN DISPOSABLE) ×2 IMPLANT
GOWN STRL REUS W/TWL LRG LVL3 (GOWN DISPOSABLE) ×2
GOWN STRL REUS W/TWL XL LVL3 (GOWN DISPOSABLE) ×2
HANDPIECE INTERPULSE COAX TIP (DISPOSABLE) ×1
HEAD FEMORAL 32 CERAMIC (Hips) IMPLANT
KIT BASIN OR (CUSTOM PROCEDURE TRAY) ×1 IMPLANT
KIT TURNOVER KIT B (KITS) ×1 IMPLANT
LINER ACETABULAR 32X50 (Liner) IMPLANT
MANIFOLD NEPTUNE II (INSTRUMENTS) ×1 IMPLANT
NS IRRIG 1000ML POUR BTL (IV SOLUTION) ×1 IMPLANT
PACK TOTAL JOINT (CUSTOM PROCEDURE TRAY) ×1 IMPLANT
PAD ARMBOARD 7.5X6 YLW CONV (MISCELLANEOUS) ×1 IMPLANT
SET HNDPC FAN SPRY TIP SCT (DISPOSABLE) ×1 IMPLANT
STAPLER VISISTAT 35W (STAPLE) IMPLANT
STEM CORAIL KA11 (Stem) IMPLANT
STRIP CLOSURE SKIN 1/2X4 (GAUZE/BANDAGES/DRESSINGS) ×2 IMPLANT
SUT ETHIBOND NAB CT1 #1 30IN (SUTURE) ×1 IMPLANT
SUT ETHILON 2 0 FS 18 (SUTURE) IMPLANT
SUT MNCRL AB 4-0 PS2 18 (SUTURE) IMPLANT
SUT VIC AB 0 CT1 27 (SUTURE) ×1
SUT VIC AB 0 CT1 27XBRD ANBCTR (SUTURE) ×1 IMPLANT
SUT VIC AB 1 CT1 27 (SUTURE) ×2
SUT VIC AB 1 CT1 27XBRD ANBCTR (SUTURE) ×1 IMPLANT
SUT VIC AB 2-0 CT1 27 (SUTURE) ×3
SUT VIC AB 2-0 CT1 TAPERPNT 27 (SUTURE) ×1 IMPLANT
TOWEL GREEN STERILE (TOWEL DISPOSABLE) ×1 IMPLANT
TOWEL GREEN STERILE FF (TOWEL DISPOSABLE) ×1 IMPLANT
TRAY CATH INTERMITTENT SS 16FR (CATHETERS) IMPLANT
TRAY FOLEY W/BAG SLVR 16FR (SET/KITS/TRAYS/PACK) ×1
TRAY FOLEY W/BAG SLVR 16FR ST (SET/KITS/TRAYS/PACK) IMPLANT
WATER STERILE IRR 1000ML POUR (IV SOLUTION) ×2 IMPLANT

## 2023-07-02 NOTE — Plan of Care (Signed)
  Problem: Activity: Goal: Risk for activity intolerance will decrease Outcome: Progressing   Problem: Pain Managment: Goal: General experience of comfort will improve Outcome: Progressing   

## 2023-07-02 NOTE — Interval H&P Note (Signed)
History and Physical Interval Note:   The patient understands that she is here today for a right total hip replacement to treat her severe right hip arthritis.  There has been no acute or interval change in her medical status.  The risks and benefits of surgery have been discussed in detail and informed consent has been obtained.  The right operative hip has been marked.  07/02/2023 7:20 AM  Ellen Sawyer  has presented today for surgery, with the diagnosis of osteoarthritis right hip.  The various methods of treatment have been discussed with the patient and family. After consideration of risks, benefits and other options for treatment, the patient has consented to  Procedure(s): RIGHT TOTAL HIP ARTHROPLASTY ANTERIOR APPROACH (Right) as a surgical intervention.  The patient's history has been reviewed, patient examined, no change in status, stable for surgery.  I have reviewed the patient's chart and labs.  Questions were answered to the patient's satisfaction.     Kathryne Hitch

## 2023-07-02 NOTE — Discharge Instructions (Signed)

## 2023-07-02 NOTE — Anesthesia Procedure Notes (Signed)
Spinal  Patient location during procedure: OR Start time: 07/02/2023 7:40 AM End time: 07/02/2023 7:45 AM Reason for block: surgical anesthesia Staffing Performed: anesthesiologist  Anesthesiologist: Leonides Grills, MD Performed by: Leonides Grills, MD Authorized by: Leonides Grills, MD   Preanesthetic Checklist Completed: patient identified, IV checked, risks and benefits discussed, surgical consent, monitors and equipment checked, pre-op evaluation and timeout performed Spinal Block Patient position: sitting Prep: DuraPrep Patient monitoring: cardiac monitor, continuous pulse ox and blood pressure Approach: midline Location: L3-4 Injection technique: single-shot Needle Needle type: Quincke  Needle gauge: 22 G Needle length: 9 cm Assessment Sensory level: T10 Events: CSF return Additional Notes Functioning IV was confirmed and monitors were applied. Sterile prep and drape, including hand hygiene and sterile gloves were used. The patient was positioned and the spine was prepped. The skin was anesthetized with lidocaine.  Free flow of clear CSF was obtained on the second attempt prior to injecting local anesthetic into the CSF.  The spinal needle aspirated freely following injection.  The needle was carefully withdrawn.  The patient tolerated the procedure well.

## 2023-07-02 NOTE — Op Note (Signed)
Operative Note  Date of operation: 07/02/2023 Preoperative diagnosis: Right hip primary osteoarthritis Postoperative diagnosis: Same  Procedure: Right direct anterior total hip arthroplasty  Implants: Implant Name Type Inv. Item Serial No. Manufacturer Lot No. LRB No. Used Action  CUP SECTOR GRIPTON - [id number] Cup CUP SECTOR GRIPTON  [employer]PU[employer] [id number] Right 1 Implanted  LINER ACETABULAR 32X50 - [id number] Liner LINER ACETABULAR 32X50  DEPUY ORTH[employer]AED[employer] [id number] Right 1 Implanted  STEM CORAIL KA11 - [id number] Stem STEM CORAIL KA11  DEPUY ORTH[employer] [id number] Right 1 Implanted  HEAD FEMORAL 32 CERAMIC - [id number] Hips HEAD FEMORAL 32 CERAMIC  DEPUY ORTH[employer] [id number] Right 1 Implanted   Surgeon: Vanita Panda. Magnus Ivan, MD Assistant: Rexene Edison, PA-C  Anesthesia: Spinal Antibiotics: IV vancomycin EBL: 300 cc Complications: None  Indications: The patient is a 67 year old female well-known to me.  She has debilitating arthritis involving the right hip.  We actually replaced her left hip for the same reason in 2019 and that is done well.  Her right hip pain has become daily and it is definitely affecting her mobility, her quality life and her actives of daily living.  She has tried and failed conservative treatment for over a year.  Having had this before she is fully aware of the risks of acute blood loss anemia, nerve vessel injury, fracture, infection, DVT, dislocation, implant failure, leg length differences and wound healing issues.  She understands her goals are hopefully decrease pain, improve mobility, and improve quality of life.  Procedure description: After informed consent was obtained and the appropriate right hip was marked, the patient was brought to the operating room and set up in the stretcher where spinal anesthesia was obtained.  She was then laid in supine position on stretcher and a Foley catheter was placed.  Traction boots were placed on both her  feet and she was placed supine on the Hana fracture table with a perineal post in place in both legs and inline skeletal traction devices but no traction applied.  Her right operative hip was assessed radiographically as well as her pelvis.  The right hip was then prepped and draped in DuraPrep and sterile drapes.  A timeout was called and she was identified as the correct patient and the correct right hip.  An incision was then made just inferior and posterior to the ASIS and carried slightly obliquely down the leg.  Dissection was carried down to the tensor fascia lata muscle and the tensor fascia was then divided longitudinally to proceed with a direct anterior approach to the hip.  Circumflex vessels were identified and cauterized.  The hip capsule identified and opened up in L-type format finding a moderate joint effusion.  Cobra retractors were placed around the medial and lateral femoral neck and a femoral neck cut was made with an oscillating saw just proximal to the lesser trochanter.  This Was completed with an osteotome.  A corkscrew guide was placed in the femoral head and the femoral head was removed in its entirety.  There was a significant area of cartilage wear.  A bent Hohmann was then placed over the medial acetabular rim and remnants of the acetabular labrum and other debris removed.  Reaming was then initiated under direct visualization from a size 43 reamer and stepwise increments going up to a size 49 reamer with all reamers again placed under direct visualization and the last reamer also placed under direct fluoroscopy in order to obtain the depth of reaming, the inclination and the  anteversion.  The real DePuy sector GRIPTION acetabular component size 50 was then placed without difficulty followed by a 32+0 polyethylene liner.  Attention was then turned to the femur.  With the right leg externally rotated to 120 degrees, extended and adducted, a Mueller retractor was placed medially and a  Hohmann retractor was placed behind the greater trochanter.  The lateral joint capsule was released and a box cutting osteotome was used to enter the femoral canal.  Broaching was then initiated using the Corail broaching system from a size 8 going to a size 11.  With the size 11 in place we trialed a standard offset trial femoral neck and a 32+1 trial hip ball.  The right leg was brought over and up and with traction and internal rotation reduced in the pelvis.  We assessed it radiographically and clinically and we are pleased with leg length, offset, range of motion and stability.  The right hip was then dislocated and the trial components were removed.  The real size 11 standard femoral component Corail was then placed without difficulty followed by the real 32+1 ceramic hip ball.  Again this was reduced in the pelvis we are pleased with stability and radiographic assessment showing similar offset and leg length.  The soft tissue was then irrigated with normal saline solution.  Joint capsule remnants were closed with interrupted #1 Ethibond suture followed by #1 Vicryl to close the tensor fascia.  0 Vicryl was used to close the deep tissue and 2-0 Vicryl was used to close subcutaneous tissue.  The skin was closed with interrupted 2-0 nylon suture.  An Aquacel dressing was applied.  Patient was taken off of the Hana table and taken to the recovery room in stable condition.  Rexene Edison, PA-C did assist during the entire case and beginning to end and his assistance was medically necessary and crucial for soft tissue management and retraction, helping guide implant placement and a layered closure of the wound.

## 2023-07-02 NOTE — Anesthesia Preprocedure Evaluation (Addendum)
Anesthesia Evaluation  Patient identified by MRN, date of birth, ID band Patient awake    Reviewed: Allergy & Precautions, NPO status , Patient's Chart, lab work & pertinent test results  Airway Mallampati: II  TM Distance: >3 FB Neck ROM: Full    Dental no notable dental hx.    Pulmonary asthma    Pulmonary exam normal        Cardiovascular negative cardio ROS Normal cardiovascular exam     Neuro/Psych   Anxiety     negative neurological ROS     GI/Hepatic Neg liver ROS, hiatal hernia,,,  Endo/Other  Hypothyroidism    Renal/GU negative Renal ROS     Musculoskeletal  (+) Arthritis ,    Abdominal   Peds  Hematology negative hematology ROS (+)   Anesthesia Other Findings osteoarthritis right hip  Reproductive/Obstetrics                             Anesthesia Physical Anesthesia Plan  ASA: 2  Anesthesia Plan: Spinal   Post-op Pain Management:    Induction: Intravenous  PONV Risk Score and Plan: 2 and Ondansetron, Dexamethasone, Propofol infusion, Midazolam and Treatment may vary due to age or medical condition  Airway Management Planned: Simple Face Mask  Additional Equipment:   Intra-op Plan:   Post-operative Plan:   Informed Consent: I have reviewed the patients History and Physical, chart, labs and discussed the procedure including the risks, benefits and alternatives for the proposed anesthesia with the patient or authorized representative who has indicated his/her understanding and acceptance.     Dental advisory given  Plan Discussed with: CRNA  Anesthesia Plan Comments:        Anesthesia Quick Evaluation

## 2023-07-02 NOTE — Evaluation (Signed)
Physical Therapy Evaluation Patient Details Name: Ellen Sawyer MRN: [id number] DOB: February 08, 1956 Today's Date: 07/02/2023  History of Present Illness  Pt is 67 year old presented to [facility] on  07/02/23 for rt THR. PMH - Lt THR, rt rotator cuff repair, lt rotator cuff repair, lumbar laminectomy  Clinical Impression  Pt admitted with above diagnosis and presents to PT with functional limitations due to deficits listed below (See PT problem list). Pt needs skilled PT to maximize independence and safety. Pt doing well POD #0 and expect good progress and plans to return home with family.          If plan is discharge home, recommend the following: Help with stairs or ramp for entrance;Assist for transportation   Can travel by private vehicle        Equipment Recommendations None recommended by PT  Recommendations for Other Services       Functional Status Assessment Patient has had a recent decline in their functional status and demonstrates the ability to make significant improvements in function in a reasonable and predictable amount of time.     Precautions / Restrictions Precautions Precautions: None Restrictions Weight Bearing Restrictions: Yes RLE Weight Bearing: Weight bearing as tolerated      Mobility  Bed Mobility Overal bed mobility: Needs Assistance Bed Mobility: Supine to Sit     Supine to sit: Min assist     General bed mobility comments: Assist to bring RLE off of bed    Transfers Overall transfer level: Needs assistance Equipment used: Rolling walker (2 wheels) Transfers: Sit to/from Stand Sit to Stand: Min guard           General transfer comment: Assist for safety. Verbal cues for hand placement.    Ambulation/Gait Ambulation/Gait assistance: Min guard Gait Distance (Feet): 60 Feet Assistive device: Rolling walker (2 wheels) Gait Pattern/deviations: Step-to pattern, Decreased stance time - right, Decreased step length - left Gait  velocity: decr Gait velocity interpretation: <1.31 ft/sec, indicative of household ambulator   General Gait Details: Assist for safety  Stairs            Wheelchair Mobility     Tilt Bed    Modified Rankin (Stroke Patients Only)       Balance Overall balance assessment: Mild deficits observed, not formally tested                                           Pertinent Vitals/Pain Pain Assessment Pain Assessment: 0-10 Pain Score: 5  Pain Location: rt hip Pain Descriptors / Indicators: Grimacing, Guarding Pain Intervention(s): Limited activity within patient's tolerance, Monitored during session, Premedicated before session, Repositioned    Home Living Family/patient expects to be discharged to:: Private residence Living Arrangements: Spouse/significant other Available Help at Discharge: Family;Available 24 hours/day Type of Home: House Home Access: Stairs to enter Entrance Stairs-Rails: None Entrance Stairs-Number of Steps: 4-5   Home Layout: One level Home Equipment: Agricultural consultant (2 wheels);Cane - single point;BSC/3in1      Prior Function Prior Level of Function : Independent/Modified Independent;Working/employed;Driving             Mobility Comments: Works as Engineer, 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       [employer]        Extremity/Trunk Assessment   Upper Extremity Assessment Upper Extremity Assessment: Overall WFL for tasks assessed    Lower Extremity Assessment  Lower Extremity Assessment: RLE deficits/detail RLE Deficits / Details: Limited by pain       Communication   Communication: No difficulties  Cognition Arousal/Alertness: Awake/alert Behavior During Therapy: WFL for tasks assessed/performed Overall Cognitive Status: Within Functional Limits for tasks assessed                                          General Comments      Exercises Total Joint Exercises Ankle Circles/Pumps: AROM, Both, Seated Quad Sets:  AROM, Both, Seated   Assessment/Plan    PT Assessment Patient needs continued PT services  PT Problem List Decreased strength;Decreased mobility;Pain       PT Treatment Interventions DME instruction;Gait training;Stair training;Functional mobility training;Therapeutic activities;Therapeutic exercise;Patient/family education    PT Goals (Current goals can be found in the Care Plan section)  Acute Rehab PT Goals Patient Stated Goal: return home PT Goal Formulation: With patient Time For Goal Achievement: 07/09/23 Potential to Achieve Goals: Good    Frequency 7X/week     Co-evaluation               AM-PAC PT "6 Clicks" Mobility  Outcome Measure Help needed turning from your back to your side while in a flat bed without using bedrails?: A Little Help needed moving from lying on your back to sitting on the side of a flat bed without using bedrails?: A Little Help needed moving to and from a bed to a chair (including a wheelchair)?: A Little Help needed standing up from a chair using your arms (e.g., wheelchair or bedside chair)?: A Little Help needed to walk in hospital room?: A Little Help needed climbing 3-5 steps with a railing? : A Lot 6 Click Score: 17    End of Session Equipment Utilized During Treatment: Gait belt Activity Tolerance: Patient tolerated treatment well Patient left: in chair;with call bell/phone within reach;with family/visitor present Nurse Communication: Mobility status PT Visit Diagnosis: Other abnormalities of gait and mobility (R26.89);Pain Pain - Right/Left: Right Pain - part of body: Hip    Time: 1415-1440 PT Time Calculation (min) (ACUTE ONLY): 25 min   Charges:   PT Evaluation $PT Eval Low Complexity: 1 Low PT Treatments $Gait Training: 8-22 mins PT General Charges $$ ACUTE PT VISIT: 1 Visit         [facility] PT Acute Rehabilitation Services Office [phone]   Angelina Ok [facility] 07/02/2023, 2:54 PM

## 2023-07-02 NOTE — Anesthesia Postprocedure Evaluation (Signed)
Anesthesia Post Note  Patient: Ellen Sawyer  Procedure(s) Performed: RIGHT TOTAL HIP ARTHROPLASTY (Right: Hip)     Patient location during evaluation: PACU Anesthesia Type: Spinal Level of consciousness: awake Pain management: pain level controlled Vital Signs Assessment: post-procedure vital signs reviewed and stable Respiratory status: spontaneous breathing, nonlabored ventilation and respiratory function stable Cardiovascular status: blood pressure returned to baseline and stable Postop Assessment: no apparent nausea or vomiting Anesthetic complications: no   No notable events documented.  Last Vitals:  Vitals:   07/02/23 1200 07/02/23 1251  BP: 101/69 131/76  Pulse: 70 66  Resp: 17 18  Temp:  (!) 36.4 C  SpO2: 97% 99%    Last Pain:  Vitals:   07/02/23 1251  TempSrc:   PainSc: 5                  Olan Kurek P Saleema Weppler

## 2023-07-02 NOTE — Transfer of Care (Signed)
Immediate Anesthesia Transfer of Care Note  Patient: Ellen Sawyer  Procedure(s) Performed: RIGHT TOTAL HIP ARTHROPLASTY (Right: Hip)  Patient Location: PACU  Anesthesia Type:General  Level of Consciousness: awake and oriented  Airway & Oxygen Therapy: Patient Spontanous Breathing and Patient connected to face mask oxygen  Post-op Assessment: Report given to RN and Post -op Vital signs reviewed and stable  Post vital signs: Reviewed and stable  Last Vitals:  Vitals Value Taken Time  BP 103/69 07/02/23 0920  Temp 36.6 C 07/02/23 0920  Pulse 64 07/02/23 0924  Resp 16 07/02/23 0924  SpO2 94 % 07/02/23 0924  Vitals shown include unfiled device data.  Last Pain:  Vitals:   07/02/23 0920  TempSrc:   PainSc: Asleep      Patients Stated Pain Goal: 2 (07/02/23 1610)  Complications: No notable events documented.

## 2023-07-02 NOTE — TOC Initial Note (Signed)
Transition of Care [facility]) - Initial/Assessment Note    Patient Details  Name: Ellen Sawyer MRN: [id number] Date of Birth: 1956-06-04  Transition of Care [facility]) CM/SW Contact:    Epifanio Lesches, RN Phone Number: 07/02/2023, 6:29 PM  Clinical Narrative:                 -s/p R HIP ARTHROPLASTY From home with husband . Resides in a one level home. States home with 4 steps to enter.Pt agreeable to home health services and without provider preference. [facility] reviewing for acceptance. Pt without DME need. Already has RW and BSC @ home. Husband to assist with care if needed once d/c. Pt without RX med concerns os transportation issues. Post hospital f/u noted on AVS. Husband to provide transportation to home once d/c ready.  TOC team following for needs....   Expected Discharge Plan: Home w Home Health Services Barriers to Discharge: Continued Medical Work up   Patient Goals and CMS Choice            Expected Discharge Plan and Services                                              Prior Living Arrangements/Services   Lives with:: Spouse Patient language and need for interpreter reviewed:: Yes Do you feel safe going back to the place where you live?: Yes      Need for Family Participation in Patient Care: Yes (Comment) Care giver support system in place?: Yes (comment)   Criminal Activity/Legal Involvement Pertinent to Current Situation/Hospitalization: No - Comment as needed  Activities of Daily Living Home Assistive Devices/Equipment: Eyeglasses, Cane (specify quad or straight) ADL Screening (condition at time of admission) Patient's cognitive ability adequate to safely complete daily activities?: Yes Is the patient deaf or have difficulty hearing?: Yes Does the patient have difficulty seeing, even when wearing glasses/contacts?: No Does the patient have difficulty concentrating, remembering, or making decisions?: No Patient able to express  need for assistance with ADLs?: Yes Does the patient have difficulty dressing or bathing?: No Independently performs ADLs?: Yes (appropriate for developmental age) Does the patient have difficulty walking or climbing stairs?: Yes Weakness of Legs: Right Weakness of Arms/Hands: Left  Permission Sought/Granted                  Emotional Assessment         Alcohol / Substance Use: Not Applicable Psych Involvement: No (comment)  Admission diagnosis:  Osteoarthritis of hip [M16.9] Status post total replacement of right hip [Z96.641] Patient Active Problem List   Diagnosis Date Noted   Osteoarthritis of hip 07/02/2023   Status post total replacement of right hip 07/02/2023   Unilateral primary osteoarthritis, right hip 07/01/2023   DOE (dyspnea on exertion) 09/08/2021   Cough variant asthma 09/08/2021   Status post total replacement of left hip 02/11/2018   Spondylolisthesis at L4-L5 level 09/09/2017   PCP:  Richmond Campbell., PA-C Pharmacy:   [facility] [phone] - [place], [place] - [address] [place] [address] Phone: [phone] Fax: [phone]     Social Determinants of Health (SDOH) Social History: SDOH Screenings   Tobacco Use: Low Risk  (07/02/2023)   SDOH Interventions:     Readmission Risk Interventions     No data to display

## 2023-07-03 ENCOUNTER — Encounter (HOSPITAL_COMMUNITY): Payer: Self-pay | Admitting: Orthopaedic Surgery

## 2023-07-03 DIAGNOSIS — M1611 Unilateral primary osteoarthritis, right hip: Secondary | ICD-10-CM | POA: Diagnosis not present

## 2023-07-03 MED ORDER — ASPIRIN 81 MG PO CHEW: 81.0000 mg | CHEWABLE_TABLET | Freq: Two times a day (BID) | ORAL | 0 refills | Status: AC

## 2023-07-03 MED ORDER — METHOCARBAMOL 500 MG PO TABS: 500.0000 mg | ORAL_TABLET | Freq: Four times a day (QID) | ORAL | 1 refills | Status: DC | PRN

## 2023-07-03 MED ORDER — OXYCODONE HCL 5 MG PO TABS: 5.0000 mg | ORAL_TABLET | ORAL | 0 refills | Status: DC | PRN

## 2023-07-03 NOTE — Progress Notes (Signed)
Ellen Sawyer to be D/C'd Home per MD order. Discussed with the patient and all questions fully answered.    IV catheter discontinued intact. Site without signs and symptoms of complications. Dressing and pressure applied.  An After Visit Summary was printed, reviewed and given to the patient.  Patient escorted via WC, and D/C home via private auto.  Kai Levins  07/03/2023 12:28 PM

## 2023-07-03 NOTE — Progress Notes (Signed)
Physical Therapy Treatment Patient Details Name: Ellen Sawyer MRN: [id number] DOB: [birth year] Today's Date: 07/03/2023   History of Present Illness Pt is 67 year old presented to [facility] on  07/02/23 for rt THR. PMH - Lt THR, rt rotator cuff repair, lt rotator cuff repair, lumbar laminectomy    PT Comments  Pt supine in bed sleeping.  Easy to rouse and agreeable to PT session this am.  Pt able to increase gt, perform HEP in supine and attempt stair training in prep for d/c home.  Informed nursing of pt progress.  Pt has cleared PT and ready to d/c home from a mobility standpoint with support of her husband.    If plan is discharge home, recommend the following: Help with stairs or ramp for entrance;Assist for transportation   Can travel by private vehicle        Equipment Recommendations  None recommended by PT    Recommendations for Other Services       Precautions / Restrictions Precautions Precautions: None Restrictions Weight Bearing Restrictions: Yes RLE Weight Bearing: Weight bearing as tolerated     Mobility  Bed Mobility Overal bed mobility: Needs Assistance Bed Mobility: Supine to Sit     Supine to sit: Supervision     General bed mobility comments: Increased time and effort with mild posterior bias but able to correct.    Transfers Overall transfer level: Needs assistance Equipment used: Rolling walker (2 wheels) Transfers: Sit to/from Stand             General transfer comment: Increased time and effort to rise into standing cues for hand placement to and from seated surface.    Ambulation/Gait Ambulation/Gait assistance: Supervision Gait Distance (Feet): 120 Feet Assistive device: Rolling walker (2 wheels) Gait Pattern/deviations: Step-to pattern, Decreased stance time - right, Decreased step length - left Gait velocity: decr     General Gait Details: Cues for sequencing, gt symmetry and posture.  Pt required cues to soften contact of L  foor to promote R weight shifting in stance phase.   Stairs Stairs: Yes Stairs assistance: Min guard Stair Management: One rail Right, Forwards, Step to pattern, Backwards (both hands) Number of Stairs: 4 General stair comments: Cues for sequencing and use of railing.  Pt required cues for forward ascend and backwards descend.  LLE remains weak and difficult to lift LLE at times but able to manage.   Wheelchair Mobility     Tilt Bed    Modified Rankin (Stroke Patients Only)       Balance Overall balance assessment: Needs assistance   Sitting balance-Leahy Scale: Fair       Standing balance-Leahy Scale: Fair                              Cognition Arousal/Alertness: Awake/alert Behavior During Therapy: WFL for tasks assessed/performed Overall Cognitive Status: Within Functional Limits for tasks assessed                                          Exercises Total Joint Exercises Ankle Circles/Pumps: AROM, Both, 20 reps, Supine Quad Sets: AROM, Right, 10 reps, Supine Heel Slides: AROM, AAROM, Right, 10 reps, Supine Hip ABduction/ADduction: AROM, AAROM, Right, 10 reps, Supine Long Arc Quad: AROM, Right, 10 reps, Seated    General Comments  Pertinent Vitals/Pain Pain Assessment Pain Assessment: 0-10 Pain Score: 5  Pain Location: rt hip Pain Descriptors / Indicators: Grimacing, Guarding Pain Intervention(s): Monitored during session, Repositioned    Home Living                          Prior Function            PT Goals (current goals can now be found in the care plan section) Acute Rehab PT Goals Patient Stated Goal: return home Potential to Achieve Goals: Good Progress towards PT goals: Progressing toward goals    Frequency    7X/week      PT Plan Current plan remains appropriate    Co-evaluation              AM-PAC PT "6 Clicks" Mobility   Outcome Measure  Help needed turning from your  back to your side while in a flat bed without using bedrails?: A Little Help needed moving from lying on your back to sitting on the side of a flat bed without using bedrails?: A Little Help needed moving to and from a bed to a chair (including a wheelchair)?: A Little Help needed standing up from a chair using your arms (e.g., wheelchair or bedside chair)?: A Little Help needed to walk in hospital room?: A Little Help needed climbing 3-5 steps with a railing? : A Little 6 Click Score: 18    End of Session Equipment Utilized During Treatment: Gait belt Activity Tolerance: Patient tolerated treatment well Patient left: in chair;with call bell/phone within reach;with family/visitor present Nurse Communication: Mobility status PT Visit Diagnosis: Other abnormalities of gait and mobility (R26.89);Pain Pain - Right/Left: Right Pain - part of body: Hip     Time: 1110-1134 PT Time Calculation (min) (ACUTE ONLY): 24 min  Charges:    $Gait Training: 8-22 mins $Therapeutic Exercise: 8-22 mins PT General Charges $$ ACUTE PT VISIT: 1 Visit                     Bonney Leitz , PTA Acute Rehabilitation Services Office [phone]    Florestine Avers 07/03/2023, 11:50 AM

## 2023-07-03 NOTE — Plan of Care (Signed)
  Problem: Clinical Measurements: Goal: Ability to maintain clinical measurements within normal limits will improve Outcome: Progressing Goal: Will remain free from infection Outcome: Progressing Goal: Diagnostic test results will improve Outcome: Progressing Goal: Respiratory complications will improve Outcome: Progressing Goal: Cardiovascular complication will be avoided Outcome: Progressing   Problem: Health Behavior/Discharge Planning: Goal: Ability to manage health-related needs will improve Outcome: Progressing   Problem: Education: Goal: Knowledge of General Education information will improve Description: Including pain rating scale, medication(s)/side effects and non-pharmacologic comfort measures Outcome: Progressing   Problem: Activity: Goal: Risk for activity intolerance will decrease Outcome: Progressing   Problem: Nutrition: Goal: Adequate nutrition will be maintained Outcome: Progressing   Problem: Coping: Goal: Level of anxiety will decrease Outcome: Progressing   Problem: Elimination: Goal: Will not experience complications related to bowel motility Outcome: Progressing Goal: Will not experience complications related to urinary retention Outcome: Progressing   Problem: Pain Managment: Goal: General experience of comfort will improve Outcome: Progressing   Problem: Safety: Goal: Ability to remain free from injury will improve Outcome: Progressing   Problem: Skin Integrity: Goal: Risk for impaired skin integrity will decrease Outcome: Progressing   Problem: Education: Goal: Knowledge of the prescribed therapeutic regimen will improve Outcome: Progressing Goal: Understanding of discharge needs will improve Outcome: Progressing Goal: Individualized Educational Video(s) Outcome: Progressing   Problem: Activity: Goal: Ability to avoid complications of mobility impairment will improve Outcome: Progressing Goal: Ability to tolerate increased  activity will improve Outcome: Progressing   Problem: Clinical Measurements: Goal: Postoperative complications will be avoided or minimized Outcome: Progressing   Problem: Pain Management: Goal: Pain level will decrease with appropriate interventions Outcome: Progressing   Problem: Skin Integrity: Goal: Will show signs of wound healing Outcome: Progressing

## 2023-07-03 NOTE — Progress Notes (Signed)
Foley cath removed this am, 600cc in bag.  Pt educated.

## 2023-07-03 NOTE — Discharge Summary (Signed)
Patient ID: Ellen Sawyer MRN: [id number] DOB/AGE: Jun 17, 1956 67 y.o.  Admit date: 07/02/2023 Discharge date: 07/03/2023  Admission Diagnoses:  Principal Problem:   Unilateral primary osteoarthritis, right hip Active Problems:   Osteoarthritis of hip   Status post total replacement of right hip   Discharge Diagnoses:  Same  Past Medical History:  Diagnosis Date   Anxiety    DULoxetine (CYMBALTA)   Asthma    Chest discomfort    in the past   Chest pain    in the past   History of hiatal hernia    Hypercholesterolemia    Hypertriglyceridemia    Osteoarthritis of left hip    PONV (postoperative nausea and vomiting)    Pre-diabetes    year ago was on metformin, then she has been of metforming for many years    Surgeries: Procedure(s): RIGHT TOTAL HIP ARTHROPLASTY on 07/02/2023   Consultants:   Discharged Condition: Improved  Hospital Course: Ellen Sawyer is an 66 y.o. female who was admitted 07/02/2023 for operative treatment ofUnilateral primary osteoarthritis, right hip. Patient has severe unremitting pain that affects sleep, daily activities, and work/hobbies. After pre-op clearance the patient was taken to the operating room on 07/02/2023 and underwent  Procedure(s): RIGHT TOTAL HIP ARTHROPLASTY.    Patient was given perioperative antibiotics:  Anti-infectives (From admission, onward)    Start     Dose/Rate Route Frequency Ordered Stop   07/02/23 1800  vancomycin (VANCOCIN) IVPB 1000 mg/200 mL premix        1,000 mg 200 mL/hr over 60 Minutes Intravenous Every 12 hours 07/02/23 1318 07/02/23 1905   07/02/23 0645  vancomycin (VANCOCIN) IVPB 1000 mg/200 mL premix        1,000 mg 200 mL/hr over 60 Minutes Intravenous  Once 07/02/23 0636 07/02/23 0749        Patient was given sequential compression devices, early ambulation, and chemoprophylaxis to prevent DVT.  Patient benefited maximally from hospital stay and there were no complications.    Recent  vital signs: Patient Vitals for the past 24 hrs:  BP Temp Temp src Pulse Resp SpO2  07/03/23 0736 117/65 97.9 F (36.6 C) Oral 82 -- 100 %  07/03/23 0418 107/67 98 F (36.7 C) -- 82 18 98 %  07/03/23 0002 131/71 98.2 F (36.8 C) Oral 70 16 100 %  07/02/23 2047 -- -- -- 73 17 100 %  07/02/23 1927 134/71 98.2 F (36.8 C) Oral 73 17 100 %  07/02/23 1548 121/69 97.6 F (36.4 C) Oral 78 -- 96 %  07/02/23 1251 131/76 (!) 97.5 F (36.4 C) -- 66 18 99 %  07/02/23 1200 101/69 -- -- 70 17 97 %  07/02/23 1145 103/66 -- -- 61 13 96 %  07/02/23 1130 121/77 -- -- (!) 59 12 95 %  07/02/23 1115 115/77 97.9 F (36.6 C) -- 61 13 95 %  07/02/23 1100 110/68 -- -- (!) 59 12 95 %  07/02/23 1045 95/63 -- -- (!) 57 13 96 %  07/02/23 1030 102/69 -- -- (!) 57 13 96 %  07/02/23 1015 118/75 -- -- (!) 55 13 97 %  07/02/23 1000 109/65 -- -- (!) 56 14 93 %  07/02/23 0945 117/72 -- -- (!) 56 13 96 %  07/02/23 0930 97/65 -- -- 62 14 96 %  07/02/23 0920 103/69 97.9 F (36.6 C) -- 79 13 95 %     Recent laboratory studies:  Recent Labs    07/03/23  0126  WBC 13.1*  HGB 10.2*  HCT 32.1*  PLT 241  NA 138  K 4.3  CL 106  CO2 24  BUN 19  CREATININE 0.96  GLUCOSE 138*  CALCIUM 8.1*     Discharge Medications:   Allergies as of 07/03/2023       Reactions   Ceftin [cefuroxime Axetil] Anaphylaxis, Other (See Comments)   Other   Cinobac [cinoxacin] Anaphylaxis        Medication List     STOP taking these medications    aspirin EC 81 MG tablet Replaced by: aspirin 81 MG chewable tablet       TAKE these medications    albuterol 108 (90 Base) MCG/ACT inhaler Commonly known as: VENTOLIN HFA Inhale 1 puff into the lungs every 6 (six) hours as needed for shortness of breath.   aspirin 81 MG chewable tablet Chew 1 tablet (81 mg total) by mouth 2 (two) times daily. Replaces: aspirin EC 81 MG tablet   b complex vitamins tablet Take 1 tablet by mouth daily.   budesonide-formoterol 80-4.5  MCG/ACT inhaler Commonly known as: Symbicort Take 2 puffs first thing in am and then another 2 puffs about 12 hours later.   DULoxetine 60 MG capsule Commonly known as: CYMBALTA Take 60 mg by mouth daily.   DULoxetine 30 MG capsule Commonly known as: CYMBALTA Take 30 mg by mouth daily.   gabapentin 300 MG capsule Commonly known as: NEURONTIN Take 1 capsule (300 mg total) by mouth at bedtime.   levothyroxine 50 MCG tablet Commonly known as: SYNTHROID Take 50 mcg by mouth daily before breakfast.   LORazepam 0.5 MG tablet Commonly known as: ATIVAN Take 0.5 mg by mouth 2 (two) times daily as needed for anxiety.   methocarbamol 500 MG tablet Commonly known as: Robaxin Take 1 tablet (500 mg total) by mouth every 8 (eight) hours as needed for muscle spasms. What changed: Another medication with the same name was added. Make sure you understand how and when to take each.   methocarbamol 500 MG tablet Commonly known as: ROBAXIN Take 1 tablet (500 mg total) by mouth every 6 (six) hours as needed for muscle spasms. What changed: You were already taking a medication with the same name, and this prescription was added. Make sure you understand how and when to take each.   ondansetron 4 MG disintegrating tablet Commonly known as: ZOFRAN-ODT 4mg  ODT q4 hours prn nausea/vomit   oxyCODONE 5 MG immediate release tablet Commonly known as: Oxy IR/ROXICODONE Take 1-2 tablets (5-10 mg total) by mouth every 4 (four) hours as needed for moderate pain (pain score 4-6).   pantoprazole 40 MG tablet Commonly known as: PROTONIX Take 40 mg by mouth daily.   Vitamin D (Ergocalciferol) 1.25 MG (50000 UNIT) Caps capsule Commonly known as: DRISDOL Take 50,000 Units by mouth every 7 (seven) days. Saturdays               Durable Medical Equipment  (From admission, onward)           Start     Ordered   07/02/23 1258  DME 3 n 1  Once        07/02/23 1257   07/02/23 1258  DME Walker  rolling  Once       Question Answer Comment  Walker: With 5 Inch Wheels   Patient needs a walker to treat with the following condition Status post total replacement of right hip  07/02/23 1257            Diagnostic Studies: DG Pelvis Portable  Result Date: 07/02/2023 CLINICAL DATA:  Status post right hip arthroplasty. EXAM: PORTABLE PELVIS 1-2 VIEWS COMPARISON:  Pelvis and right hip radiographs 02/11/2023 FINDINGS: Frontal view of the bilateral hips. Interval total right hip arthroplasty. Redemonstration of total left hip arthroplasty. No perihardware lucency is seen to indicate hardware failure or loosening. Expected postoperative changes including lateral hip subcutaneous air. Mild bilateral sacroiliac subchondral sclerosis. The pubic symphysis joint space is maintained. No acute fracture or dislocation. IMPRESSION: Interval total right hip arthroplasty without evidence of hardware failure or loosening. Electronically Signed   By: Neita Garnet M.D.   On: 07/02/2023 11:16   DG HIP UNILAT WITH PELVIS 1V RIGHT  Result Date: 07/02/2023 CLINICAL DATA:  Fall right hip arthrogram plasty. Intraoperative fluoroscopy. EXAM: DG HIP (WITH OR WITHOUT PELVIS) 1V RIGHT COMPARISON:  Pelvis and right hip radiographs 02/11/2023 FINDINGS: Images were performed intraoperatively without the presence of a radiologist. New postsurgical changes of total right hip arthroplasty. Redemonstration of total left hip arthroplasty, partially visualized. No hardware complication is seen. Total fluoroscopy images: 3 Total fluoroscopy time: 22 seconds Total dose: Radiation Exposure Index (as provided by the fluoroscopic device): 2.42 mGy air Kerma Please see intraoperative findings for further detail. IMPRESSION: Intraoperative fluoroscopy for total right hip arthroplasty. Electronically Signed   By: Neita Garnet M.D.   On: 07/02/2023 11:15   DG C-Arm 1-60 Min-No Report  Result Date: 07/02/2023 Fluoroscopy was utilized  by the requesting physician.  No radiographic interpretation.   MR Cervical Spine w/o contrast  Result Date: 06/26/2023 CLINICAL DATA:  Chronic neck pain. Left shoulder pain radiating to the left arm and fingers. EXAM: MRI CERVICAL SPINE WITHOUT CONTRAST TECHNIQUE: Multiplanar, multisequence MR imaging of the cervical spine was performed. No intravenous contrast was administered. COMPARISON:  Radiography 01/29/2022 FINDINGS: Alignment: Straightening of the normal cervical lordosis. 1 mm of degenerative anterolisthesis at C7-T1. Vertebrae: No evidence of fracture. Discogenic endplate changes through the mid cervical region with low level edema at C4-5 and C5-6, which could relate to cervicalgia. Mild edema associated with the left facet at C7-T1. Cord: No cord compression or focal cord lesion. Posterior Fossa, vertebral arteries, paraspinal tissues: Negative Disc levels: The foramen magnum is widely patent. There is ordinary mild osteoarthritis of the C1-2 articulation but no encroachment upon the neural structures. C2-3: Normal interspace. C3-4, C4-5 and C5-6: Disc degeneration with endplate osteophytes and bulging of the discs. Narrowing of the ventral subarachnoid space throughout that region but without narrowing of the canal in the midline below 7.7 mm. No cord compression or deformity. Bilateral foraminal stenosis at those levels, which could possibly affect any of the exiting nerves. This is most severe on the right at C5-6. C6-7: Mild bulging of the disc.  No canal or foraminal stenosis. C7-T1: Facet osteoarthritis worse on the left. 1 mm degenerative anterolisthesis. Mild bulging of the disc. No canal stenosis. Foraminal narrowing on the left which could possibly affect the C8 nerve, though distinct nerve compression is not demonstrated. IMPRESSION: 1. C3-4, C4-5 and C5-6: Disc degeneration with endplate osteophytes and bulging of the discs. Narrowing of the ventral subarachnoid space throughout that  region but without narrowing of the canal in the midline below 7.7 mm. No cord compression or deformity. Bilateral foraminal stenosis at those levels, most severe on the right at C5-6. Discogenic endplate marrow changes with some edema, which could relate to regional pain.  2. C7-T1: Facet osteoarthritis worse on the left with 1 mm of anterolisthesis. Mild bulging of the disc. Foraminal narrowing on the left which could possibly affect the left C8 nerve, though distinct nerve compression is not demonstrated. Electronically Signed   By: Paulina Fusi M.D.   On: 06/26/2023 13:59   XR Shoulder Left  Result Date: 06/17/2023 Left shoulder 3 views: Shoulder is well located.  No acute fracture or acute findings.  Mild narrowing on the axillary view.   Disposition: Discharge disposition: 06-Home-Health Care Svc          Follow-up Information     Richmond Campbell., PA-C Follow up.   Specialty: Family Medicine Contact information: [address] [place] [address] [phone]         Kathryne Hitch, MD Follow up in 2 week(s).   Specialty: Orthopedic Surgery Contact information: [address] [place] [address] [phone]                  Signed: Richardean Canal 07/03/2023, 9:13 AM

## 2023-07-03 NOTE — Progress Notes (Signed)
Subjective: 1 Day Post-Op Procedure(s) (LRB): RIGHT TOTAL HIP ARTHROPLASTY (Right) Patient reports pain as moderate.  No complaints. Wanting to discharge home later today after PT.   Objective: Vital signs in last 24 hours: Temp:  [97.5 F (36.4 C)-98.2 F (36.8 C)] 97.9 F (36.6 C) (07/31 0736) Pulse Rate:  [55-82] 82 (07/31 0736) Resp:  [12-18] 18 (07/31 0418) BP: (95-134)/(63-77) 117/65 (07/31 0736) SpO2:  [93 %-100 %] 100 % (07/31 0736)  Intake/Output from previous day: 07/30 0701 - 07/31 0700 In: 1005.7 [I.V.:1005.7] Out: 950 [Urine:650; Blood:300] Intake/Output this shift: No intake/output data recorded.  Recent Labs    07/03/23 0126  HGB 10.2*   Recent Labs    07/03/23 0126  WBC 13.1*  RBC 3.68*  HCT 32.1*  PLT 241   Recent Labs    07/03/23 0126  NA 138  K 4.3  CL 106  CO2 24  BUN 19  CREATININE 0.96  GLUCOSE 138*  CALCIUM 8.1*   No results for input(s): "LABPT", "INR" in the last 72 hours.  Intact pulses distally Dorsiflexion/Plantar flexion intact Incision: scant drainage Compartment soft   Assessment/Plan: 1 Day Post-Op Procedure(s) (LRB): RIGHT TOTAL HIP ARTHROPLASTY (Right) Up with therapy Discharge home with home health if patient remains stable and does well with PT    Ellen Sawyer 07/03/2023, 8:43 AM

## 2023-07-15 ENCOUNTER — Ambulatory Visit (INDEPENDENT_AMBULATORY_CARE_PROVIDER_SITE_OTHER): Payer: Medicare Other | Admitting: Orthopaedic Surgery

## 2023-07-15 ENCOUNTER — Encounter: Payer: Self-pay | Admitting: Orthopaedic Surgery

## 2023-07-15 DIAGNOSIS — Z96641 Presence of right artificial hip joint: Secondary | ICD-10-CM

## 2023-07-15 NOTE — Progress Notes (Signed)
The patient is here for first postoperative visit status post a right total hip arthroplasty.  She has remote history of a left hip replacement.  She is doing well overall.  She is only taken a few oxycodone.  She has back just to 1 baby aspirin daily.  She is taking methocarbamol and Neurontin as well.  On exam her right hip incision looks good.  I did remove the sutures in place Steri-Strips.  I felt that there was nobody a significant seroma but ends up and it is more of adipose tissue.  Overall there was no complicating features.  She will increase her mobility slowly.  We will see her back in a month to see how she is doing overall but no x-rays are needed.

## 2023-08-12 ENCOUNTER — Ambulatory Visit (INDEPENDENT_AMBULATORY_CARE_PROVIDER_SITE_OTHER): Payer: Medicare Other | Admitting: Orthopaedic Surgery

## 2023-08-12 ENCOUNTER — Encounter: Payer: Self-pay | Admitting: Orthopaedic Surgery

## 2023-08-12 DIAGNOSIS — Z96641 Presence of right artificial hip joint: Secondary | ICD-10-CM

## 2023-08-12 NOTE — Progress Notes (Signed)
The patient is now 6 weeks status post a right total hip arthroplasty.  We replaced her left hip in 2019.  She is scheduled to go back to work around 23 September.  However she is still having a lot of pain and walking with a significant limp still.  On exam I can easily put her right operative of the range of motion but she has a lot of guarding and pain with that hip.  I did watch her walk and she is walking very slow and has guarding of that hip as well.  I want her to continue to go slow.  We will actually reevaluate her in just 2 weeks since that is her scheduled time to return to work.  At that visit I would like a standing AP pelvis and lateral of that more recent right hip.  She agrees with this treatment plan.

## 2023-08-26 ENCOUNTER — Other Ambulatory Visit (INDEPENDENT_AMBULATORY_CARE_PROVIDER_SITE_OTHER): Payer: Medicare Other

## 2023-08-26 ENCOUNTER — Encounter: Payer: Self-pay | Admitting: Orthopaedic Surgery

## 2023-08-26 ENCOUNTER — Ambulatory Visit: Payer: Medicare Other | Admitting: Orthopaedic Surgery

## 2023-08-26 DIAGNOSIS — Z96641 Presence of right artificial hip joint: Secondary | ICD-10-CM

## 2023-08-26 NOTE — Progress Notes (Signed)
The patient is getting close to 8 weeks status post a right total hip arthroplasty.  We have replaced her left hip before.  She is ready go back to where she states tomorrow.  She feels like she can perform her regular work duties and that her work will work with her.  I just saw her 2 weeks ago but I want to see her back today since she was planning to go back to work and making sure that she is mobilizing well.  Overall she looks much better than she did 2 weeks ago.  She is walking without assistive device with a minimal limp and getting up off the exam table much easier.  An AP pelvis lateral right hip today shows a well-seated hip replacement more recently on the right side and she does have a hip replacement left side.  There is previous lumbar spine surgery.  From my standpoint the next imaging to see is not for 6 months unless she is having any issues.  Will have a standing AP pelvis at that visit.  If there are issues before then they know to let us know.  I did fill out paperwork for her returning to work tomorrow.

## 2023-08-27 ENCOUNTER — Telehealth: Payer: Self-pay | Admitting: Orthopaedic Surgery

## 2023-08-27 NOTE — Telephone Encounter (Signed)
[employer] forms received. To [employer].

## 2024-02-17 ENCOUNTER — Other Ambulatory Visit (INDEPENDENT_AMBULATORY_CARE_PROVIDER_SITE_OTHER)

## 2024-02-17 ENCOUNTER — Ambulatory Visit: Payer: Medicare Other | Admitting: Orthopaedic Surgery

## 2024-02-17 ENCOUNTER — Encounter: Payer: Self-pay | Admitting: Orthopaedic Surgery

## 2024-02-17 DIAGNOSIS — Z96641 Presence of right artificial hip joint: Secondary | ICD-10-CM

## 2024-02-17 NOTE — Progress Notes (Signed)
 The patient is well-known to [place].  She is 8 months out from a right total hip replacement.  We replaced her left hip in 2019.  She reports some left hip pain but overall is doing well with the right side.  She reports good range of motion and strength of both hips.  She has remote history of lumbar spine surgery as well which was a multilevel fusion.  On exam both hips move smoothly and fluidly.  I do feel that she has some trochanteric bursitis of the left hip.  There are no blocks to rotation with either hip.  A standing AP pelvis shows bilateral total hip arthroplasties that are well-seated with no complicating features.  We talked about weight loss as well as activity modification, heat, massaging and topical anti-inflammatories for the left hip.  If things worsen she knows to let us know.

## 2024-06-15 ENCOUNTER — Encounter: Payer: Self-pay | Admitting: Physician Assistant

## 2024-06-15 ENCOUNTER — Other Ambulatory Visit (INDEPENDENT_AMBULATORY_CARE_PROVIDER_SITE_OTHER)

## 2024-06-15 ENCOUNTER — Other Ambulatory Visit: Payer: Self-pay

## 2024-06-15 ENCOUNTER — Ambulatory Visit: Payer: PRIVATE HEALTH INSURANCE | Admitting: Physician Assistant

## 2024-06-15 DIAGNOSIS — M79641 Pain in right hand: Secondary | ICD-10-CM | POA: Diagnosis not present

## 2024-06-15 DIAGNOSIS — S86111A Strain of other muscle(s) and tendon(s) of posterior muscle group at lower leg level, right leg, initial encounter: Secondary | ICD-10-CM

## 2024-06-15 DIAGNOSIS — M79661 Pain in right lower leg: Secondary | ICD-10-CM

## 2024-06-15 MED ORDER — METHYLPREDNISOLONE ACETATE 40 MG/ML IJ SUSP
20.0000 mg | INTRAMUSCULAR | Status: AC | PRN
Start: 2024-06-15 — End: 2024-06-15
  Administered 2024-06-15: 20 mg

## 2024-06-15 MED ORDER — LIDOCAINE HCL 1 % IJ SOLN
0.5000 mL | INTRAMUSCULAR | Status: AC | PRN
Start: 2024-06-15 — End: 2024-06-15
  Administered 2024-06-15: .5 mL

## 2024-06-15 NOTE — Progress Notes (Signed)
 Office Visit Note   Patient: Ellen Sawyer           Date of Birth: 1955/12/23           MRN: [id number] Visit Date: 06/15/2024              Requested by: Debrah Josette ORN., PA-C [address]  [place] [address] PCP: Debrah Josette ORN., PA-C   Assessment & Plan: Visit Diagnoses:  1. Pain in right hand   2. Strain of right gastrocnemius muscle, initial encounter     Plan: Right lower extremity is wrapped with an Ace wrap she will remove this daily.  She did wear this to help with the swelling.  She has a cam walker boot at home she needs to wear the cam walker boot when up ambulating she does not need to wear it to bed.  Follow-up in 3 weeks to see how she is progressing in regards to the gastroc injury.  She tolerated the trigger finger injection well today.  Follow-Up Instructions: Return in about 3 weeks (around 07/06/2024), or Dr. Vernetta.   Orders:  Orders Placed This Encounter  Procedures   Hand/UE Inj: L long A1   XR Tibia/Fibula Right   XR Hand Complete Left   No orders of the defined types were placed in this encounter.     Procedures: Hand/UE Inj: L long A1 for trigger finger on 06/15/2024 9:12 AM Details: volar approach Medications: 0.5 mL lidocaine  1 %; 20 mg methylPREDNISolone  acetate 40 MG/ML      Clinical Data: No additional findings.   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  She states she was mowing grass this past Saturday lumbar came back on her she pushed lumbar and felt a pop in the back of her leg.  Since that she has had bruising and pain.  No other injury.  She also is having left hand middle finger which is getting locked at night.  She has to force her finger to straighten it.  She has had no known injury.  Nondiabetic. Review of Systems Denies any fevers chills. See  HPI.  Objective: Vital Signs: There were no vitals taken for this visit.  Physical Exam Constitutional:      Appearance: She is not ill-appearing or diaphoretic.  Cardiovascular:     Pulses: Normal pulses.  Neurological:     Mental Status: She is alert and oriented to person, place, and time.  Psychiatric:        Mood and Affect: Mood normal.        Behavior: Behavior normal.     Ortho Exam Left hand: Sensation grossly intact throughout.  No rashes skin lesions ulcerations.  Hands well-perfused.  Left long finger with active triggering and tenderness over the A1 pulley region. Right leg significant ecchymosis in the posterior aspect of the leg.  Tenderness of the medial head of the gastroc.  Thompson's test is negative.  Good range of motion of the knee without pain.  No instability valgus varus stressing of the knee.  Nontender over the medial lateral joint line right knee. Specialty Comments:  No specialty comments available.  Imaging: XR Tibia/Fibula Right Result Date: 06/15/2024 Right tib-fib 2 views: No acute fractures acute findings.  Haglund's deformity.  Calcific changes within the plantar  fascia.    PMFS History: Patient Active Problem List   Diagnosis Date Noted   Osteoarthritis of hip 07/02/2023   Status post total replacement of right hip 07/02/2023   DOE (dyspnea on exertion) 09/08/2021   Cough variant asthma 09/08/2021   Status post total replacement of left hip 02/11/2018   Spondylolisthesis at L4-L5 level 09/09/2017   Past Medical History:  Diagnosis Date   Anxiety    DULoxetine  (CYMBALTA )   Asthma    Chest discomfort    in the past   Chest pain    in the past   History of hiatal hernia    Hypercholesterolemia    Hypertriglyceridemia    Osteoarthritis of left hip    PONV (postoperative nausea and vomiting)    Pre-diabetes    year ago was on metformin, then she has been of metforming for many years    Family History  Problem Relation Age of  Onset   Coronary artery disease Father        59, several bypass   Dementia Father    Pancreatic cancer Mother     Past Surgical History:  Procedure Laterality Date   CARDIAC CATHETERIZATION     2008   cataract Bilateral    CESAREAN SECTION  1986 and 1989   COLONOSCOPY     feet surgery  1990   four   herniated disc  1990 and 1992   two   LUMBAR LAMINECTOMY     rods screws, cadiver bone    TONSILLECTOMY     TOOTH EXTRACTION     TOTAL HIP ARTHROPLASTY Left 02/11/2018   TOTAL HIP ARTHROPLASTY Left 02/11/2018   Procedure: LEFT TOTAL HIP ARTHROPLASTY ANTERIOR APPROACH;  Surgeon: Vernetta Lonni GRADE, MD;  Location: [facility] OR;  Service: Orthopedics;  Laterality: Left;   TOTAL HIP ARTHROPLASTY Right 07/02/2023   Procedure: RIGHT TOTAL HIP ARTHROPLASTY;  Surgeon: Vernetta Lonni GRADE, MD;  Location: [facility] OR;  Service: Orthopedics;  Laterality: Right;   WISDOM TOOTH EXTRACTION     Social History   Occupational History   Not on file  Tobacco Use   Smoking status: Never   Smokeless tobacco: Never  Vaping Use   Vaping status: Never Used  Substance and Sexual Activity   Alcohol use: No   Drug use: No   Sexual activity: Not on file

## 2024-06-16 ENCOUNTER — Other Ambulatory Visit: Payer: Self-pay | Admitting: Physician Assistant

## 2024-06-16 ENCOUNTER — Telehealth: Payer: Self-pay | Admitting: Physician Assistant

## 2024-06-16 MED ORDER — METHOCARBAMOL 500 MG PO TABS: 500.0000 mg | ORAL_TABLET | Freq: Four times a day (QID) | ORAL | 1 refills | Status: AC | PRN

## 2024-06-16 NOTE — Telephone Encounter (Signed)
 Rx refill methocarbamol    [facility] in [place]

## 2024-07-13 ENCOUNTER — Encounter: Payer: Self-pay | Admitting: Orthopaedic Surgery

## 2024-07-13 ENCOUNTER — Ambulatory Visit: Admitting: Orthopaedic Surgery

## 2024-07-13 DIAGNOSIS — S86111A Strain of other muscle(s) and tendon(s) of posterior muscle group at lower leg level, right leg, initial encounter: Secondary | ICD-10-CM

## 2024-07-13 NOTE — Progress Notes (Signed)
 The patient comes in today for follow-up after having a steroid injection in her left hand middle finger trigger finger that was done a month ago by Tory.  He is also treating her for an acute gastroc tear on the right side.  She does a few days a week at [facility].  She would like to get back to work this week.  She been wearing a compressive stocking as well as a cam walking boot with a heel buildup.  She is doing better overall.  This injury occurred about 3 weeks ago.  Her husband has a picture on his phone showing the bruising that she had in the back of her leg.  On exam today she still has some pain in the gastroc.  Expected but there is no bruising today.  Her Achilles is intact.  This is on the right side.  Her left middle finger trigger finger has subsided.  At this point follow-up can be as needed.  She knows to go slow with the healing of the gastroc and avoid high-impact aerobic activities for at least a total of 6 to 8 weeks after her injury.  She can still wear a compressive sleeve as needed and can come in and out of the boot as comfort allows.  We will give her a note to return to work this week and she can wear her boot at work for the first week or 2.  Follow-up is as needed.  All questions concerns were addressed and answered.

## 2024-07-29 ENCOUNTER — Emergency Department (HOSPITAL_BASED_OUTPATIENT_CLINIC_OR_DEPARTMENT_OTHER)
Admission: EM | Admit: 2024-07-29 | Discharge: 2024-07-29 | Disposition: A | Discharge status: Home / Self Care | Attending: Emergency Medicine | Admitting: Emergency Medicine

## 2024-07-29 ENCOUNTER — Other Ambulatory Visit: Payer: Self-pay

## 2024-07-29 ENCOUNTER — Emergency Department (HOSPITAL_BASED_OUTPATIENT_CLINIC_OR_DEPARTMENT_OTHER): Admitting: Radiology

## 2024-07-29 ENCOUNTER — Encounter (HOSPITAL_BASED_OUTPATIENT_CLINIC_OR_DEPARTMENT_OTHER): Payer: Self-pay

## 2024-07-29 DIAGNOSIS — S20212A Contusion of left front wall of thorax, initial encounter: Secondary | ICD-10-CM | POA: Insufficient documentation

## 2024-07-29 DIAGNOSIS — S63502A Unspecified sprain of left wrist, initial encounter: Secondary | ICD-10-CM | POA: Diagnosis not present

## 2024-07-29 DIAGNOSIS — Z7982 Long term (current) use of aspirin: Secondary | ICD-10-CM | POA: Diagnosis not present

## 2024-07-29 DIAGNOSIS — W01198A Fall on same level from slipping, tripping and stumbling with subsequent striking against other object, initial encounter: Secondary | ICD-10-CM | POA: Insufficient documentation

## 2024-07-29 MED ORDER — OXYCODONE HCL 5 MG PO TABS
5.0000 mg | ORAL_TABLET | Freq: Once | ORAL | Status: AC
  Administered 2024-07-29: 5 mg via ORAL
  Filled 2024-07-29: qty 1

## 2024-07-29 MED ORDER — OXYCODONE HCL 5 MG PO TABS: 5.0000 mg | ORAL_TABLET | Freq: Four times a day (QID) | ORAL | 0 refills | Status: AC | PRN

## 2024-07-29 NOTE — ED Provider Notes (Signed)
 Ellen Sawyer AT [facility] Provider Note   CSN: [id number] Arrival date & time: 07/29/24  9243     Patient presents with: Ellen Sawyer   Ellen Sawyer is a 68 y.o. female.   Patient mechanical fall yesterday.  She fell in her driveway landed on the left side hit her ribs and left wrist.  She did not hit her head or lose consciousness.  Not on blood thinners.  She is having pain to the left anterior ribs and left wrist.  She has had prior injury to the left wrist.  She denies any weakness numbness tingling but sometimes has some tingling in her fingers.  She denies any neck pain headache or lower extremity pain.  The history is provided by the patient.       Prior to Admission medications   Medication Sig Start Date End Date Taking? Authorizing Provider  oxyCODONE  (ROXICODONE ) 5 MG immediate release tablet Take 1 tablet (5 mg total) by mouth every 6 (six) hours as needed for up to 10 doses. 07/29/24  Yes Chriss Mannan, DO  albuterol  (VENTOLIN  HFA) 108 (90 Base) MCG/ACT inhaler Inhale 1 puff into the lungs every 6 (six) hours as needed for shortness of breath. 09/06/21   [provider]  aspirin  81 MG chewable tablet Chew 1 tablet (81 mg total) by mouth 2 (two) times daily. 07/03/23   Gretta Bertrum ORN, PA-C  b complex vitamins tablet Take 1 tablet by mouth daily.    [provider]  budesonide -formoterol  (SYMBICORT ) 80-4.5 MCG/ACT inhaler Take 2 puffs first thing in am and then another 2 puffs about 12 hours later. 09/08/21   Darlean Ozell NOVAK, MD  DULoxetine  (CYMBALTA ) 30 MG capsule Take 30 mg by mouth daily.    [provider]  DULoxetine  (CYMBALTA ) 60 MG capsule Take 60 mg by mouth daily.    [provider]  gabapentin  (NEURONTIN ) 300 MG capsule Take 1 capsule (300 mg total) by mouth at bedtime. 06/17/23   Gretta Bertrum ORN, PA-C  levothyroxine  (SYNTHROID , LEVOTHROID) 50 MCG tablet Take 50 mcg by mouth daily before breakfast.      [provider]  LORazepam  (ATIVAN ) 0.5 MG tablet Take 0.5 mg by mouth 2 (two) times daily as needed for anxiety. 01/08/22   [provider]  methocarbamol  (ROBAXIN ) 500 MG tablet Take 1 tablet (500 mg total) by mouth every 6 (six) hours as needed for muscle spasms. 06/16/24   Gretta Bertrum ORN, PA-C  ondansetron  (ZOFRAN -ODT) 4 MG disintegrating tablet 4mg  ODT q4 hours prn nausea/vomit 07/04/22   Patt Alm Macho, MD  pantoprazole  (PROTONIX ) 40 MG tablet Take 40 mg by mouth daily. 06/18/23   [provider]  Vitamin D, Ergocalciferol, (DRISDOL) 50000 units CAPS capsule Take 50,000 Units by mouth every 7 (seven) days. Saturdays 03/08/17   [provider]    Allergies: Ceftin [cefuroxime axetil] and Cinobac [cinoxacin]    Review of Systems  Updated Vital Signs BP (!) 164/74 (BP Location: Right Arm)   Pulse 99   Temp 98.4 F (36.9 C) (Oral)   Resp 19   SpO2 100%   Physical Exam Vitals and nursing note reviewed.  Constitutional:      General: She is not in acute distress.    Appearance: She is well-developed. She is not ill-appearing.  HENT:     Head: Normocephalic and atraumatic.     Nose: Nose normal.     Mouth/Throat:     Mouth: Mucous membranes are  moist.  Eyes:     Extraocular Movements: Extraocular movements intact.     Conjunctiva/sclera: Conjunctivae normal.     Pupils: Pupils are equal, round, and reactive to light.  Cardiovascular:     Rate and Rhythm: Normal rate and regular rhythm.     Heart sounds: No murmur heard. Pulmonary:     Effort: Pulmonary effort is normal. No respiratory distress.     Breath sounds: Normal breath sounds.  Abdominal:     General: Abdomen is flat.     Palpations: Abdomen is soft.     Tenderness: There is no abdominal tenderness.  Musculoskeletal:        General: Tenderness present. No swelling.     Cervical back: Normal range of motion and neck supple. No tenderness.     Comments: No midline spinal  tenderness, tenderness and some mild swelling to the left wrist but no obvious deformity, tenderness over left anterior ribs  Skin:    General: Skin is warm and dry.     Capillary Refill: Capillary refill takes less than 2 seconds.  Neurological:     General: No focal deficit present.     Mental Status: She is alert and oriented to person, place, and time.     Cranial Nerves: No cranial nerve deficit.     Sensory: No sensory deficit.     Motor: No weakness.     Coordination: Coordination normal.  Psychiatric:        Mood and Affect: Mood normal.     (all labs ordered are listed, but only abnormal results are displayed) Labs Reviewed - No data to display  EKG: None  Radiology: DG Wrist Complete Left Result Date: 07/29/2024 CLINICAL DATA:  Left wrist pain after fall yesterday. EXAM: LEFT WRIST - COMPLETE 3+ VIEW COMPARISON:  None Available. FINDINGS: There is no evidence of fracture or dislocation. Severe degenerative changes are seen involving the first carpometacarpal joint as well as the radiocarpal joint. Soft tissues are unremarkable. IMPRESSION: Severe multilevel degenerative changes are noted. No acute abnormality seen. Electronically Signed   By: Lynwood Landy Raddle M.D.   On: 07/29/2024 09:11   DG Ribs Unilateral W/Chest Left Result Date: 07/29/2024 CLINICAL DATA:  Left rib pain after fall yesterday. EXAM: LEFT RIBS AND CHEST - 3+ VIEW COMPARISON:  September 08, 2021. FINDINGS: No fracture or other bone lesions are seen involving the ribs. There is no evidence of pneumothorax or pleural effusion. Both lungs are clear. Heart size and mediastinal contours are within normal limits. IMPRESSION: Negative. Electronically Signed   By: Lynwood Landy Raddle M.D.   On: 07/29/2024 09:03     Procedures   Medications Ordered in the ED  oxyCODONE  (Oxy IR/ROXICODONE ) immediate release tablet 5 mg (5 mg Oral Given 07/29/24 9188)                                    Medical Decision Making Amount  and/or Complexity of Data Reviewed Radiology: ordered.  Risk Prescription drug management.   Ellen Sawyer is here with left-sided rib pain left wrist pain after a fall yesterday.  Not on blood thinners.  Is not having any head or neck pain.  No midline spinal tenderness.  Differential diagnosis likely sprain versus contusion versus less likely fracture.  X-rays of the ribs and left wrist were obtained which were unremarkable per radiology report.  I reviewed interpreted images as  well.  She is neurovascular neuromuscularly intact on exam.  Ultimately she is given Roxicodone  in the ED.  Will put her in a wrist splint.  Will give her narcotic pain medicine for breakthrough pain and recommend Tylenol  rest and ice.  I suspect rib contusion may be nondisplaced fractures but she is comfortable otherwise.  Will have her follow-up with her primary care doctor.  I have no concern for other traumatic processes.  Discharged in good condition.  We discussed how sometimes she can see nondisplaced rib fractures on chest x-ray but given that she is comfortable I do not think we need to pursue CT or other images at this time.  There is no pneumothorax seen on x-ray.  Patient discharged.  This chart was dictated using voice recognition software.  Despite best efforts to proofread,  errors can occur which can change the documentation meaning.      Final diagnoses:  Contusion of rib on left side, initial encounter  Sprain of left wrist, initial encounter    ED Discharge Orders          Ordered    oxyCODONE  (ROXICODONE ) 5 MG immediate release tablet  Every 6 hours PRN        07/29/24 0917               Ruthe Cornet, DO 07/29/24 [phone]

## 2024-07-29 NOTE — ED Triage Notes (Signed)
 She states she tripped and fell in her driveway yesterday, landing on her left side. She c/o left mid. Lat. Ribs area pain and also some pain of left wrist.

## 2024-07-29 NOTE — Discharge Instructions (Signed)
 Use wrist splint for comfort.  I prescribed you narcotic pain medicine for breakthrough pain.  Otherwise recommend 1000 mg of Tylenol  every 6 hours as needed for pain.  Recommend ice.  Follow-up with your primary care doctor.  Your pain medicine is sedating so please be careful with its use.

## 2024-10-05 ENCOUNTER — Encounter: Payer: Self-pay | Admitting: Radiology
